# Patient Record
Sex: Female | Born: 1995 | Race: White | Hispanic: No | Marital: Single | Smoking: Former smoker
Health system: Southern US, Community
[De-identification: ages and names within clinical notes are randomized; demographics above are authoritative.]

## PROBLEM LIST (undated history)

## (undated) DIAGNOSIS — N83209 Unspecified ovarian cyst, unspecified side: Secondary | ICD-10-CM

## (undated) DIAGNOSIS — E282 Polycystic ovarian syndrome: Secondary | ICD-10-CM

## (undated) DIAGNOSIS — F419 Anxiety disorder, unspecified: Secondary | ICD-10-CM

## (undated) HISTORY — PX: WISDOM TOOTH EXTRACTION: SHX21

## (undated) HISTORY — DX: Unspecified ovarian cyst, unspecified side: N83.209

## (undated) HISTORY — DX: Anxiety disorder, unspecified: F41.9

---

## 2018-07-09 DIAGNOSIS — F419 Anxiety disorder, unspecified: Secondary | ICD-10-CM

## 2018-07-09 DIAGNOSIS — F32A Depression, unspecified: Secondary | ICD-10-CM | POA: Insufficient documentation

## 2018-07-09 HISTORY — DX: Anxiety disorder, unspecified: F41.9

## 2018-10-07 ENCOUNTER — Emergency Department: Payer: Managed Care, Other (non HMO)

## 2018-10-07 ENCOUNTER — Emergency Department
Admission: EM | Admit: 2018-10-07 | Discharge: 2018-10-07 | Disposition: A | Discharge status: Home / Self Care | Payer: Managed Care, Other (non HMO) | Attending: Student in an Organized Health Care Education/Training Program | Admitting: Student in an Organized Health Care Education/Training Program

## 2018-10-07 ENCOUNTER — Other Ambulatory Visit: Payer: Self-pay

## 2018-10-07 ENCOUNTER — Ambulatory Visit: Payer: Self-pay | Admitting: Adult Health

## 2018-10-07 ENCOUNTER — Encounter: Payer: Self-pay | Admitting: Emergency Medicine

## 2018-10-07 VITALS — BP 118/84 | HR 87 | Temp 98.1°F | Resp 14

## 2018-10-07 DIAGNOSIS — F419 Anxiety disorder, unspecified: Secondary | ICD-10-CM

## 2018-10-07 DIAGNOSIS — R0602 Shortness of breath: Secondary | ICD-10-CM | POA: Diagnosis not present

## 2018-10-07 DIAGNOSIS — R0789 Other chest pain: Secondary | ICD-10-CM | POA: Insufficient documentation

## 2018-10-07 DIAGNOSIS — R079 Chest pain, unspecified: Secondary | ICD-10-CM | POA: Diagnosis not present

## 2018-10-07 DIAGNOSIS — Z79899 Other long term (current) drug therapy: Secondary | ICD-10-CM | POA: Diagnosis not present

## 2018-10-07 HISTORY — DX: Polycystic ovarian syndrome: E28.2

## 2018-10-07 LAB — BASIC METABOLIC PANEL
Anion gap: 9 (ref 5–15)
BUN: 12 mg/dL (ref 6–20)
CO2: 21 mmol/L — ABNORMAL LOW (ref 22–32)
Calcium: 9.4 mg/dL (ref 8.9–10.3)
Chloride: 104 mmol/L (ref 98–111)
Creatinine, Ser: 0.59 mg/dL (ref 0.44–1.00)
GFR calc Af Amer: >60 mL/min (ref >60)
Glucose, Bld: 82 mg/dL (ref 70–99)
Potassium: 3.8 mmol/L (ref 3.5–5.1)
Sodium: 134 mmol/L — ABNORMAL LOW (ref 135–145)

## 2018-10-07 LAB — CBC
HCT: 38.9 % (ref 36.0–46.0)
Hemoglobin: 12.9 g/dL (ref 12.0–15.0)
MCH: 28.4 pg (ref 26.0–34.0)
MCHC: 33.2 g/dL (ref 30.0–36.0)
MCV: 85.5 fL (ref 80.0–100.0)
Platelets: 329 10*3/uL (ref 150–400)
RBC: 4.55 MIL/uL (ref 3.87–5.11)
RDW: 13.1 % (ref 11.5–15.5)
WBC: 14.1 10*3/uL — AB (ref 4.0–10.5)
nRBC: 0 % (ref 0.0–0.2)

## 2018-10-07 LAB — TROPONIN I: Troponin I: <0.03 ng/mL (ref <0.03)

## 2018-10-07 LAB — FIBRIN DERIVATIVES D-DIMER (ARMC ONLY): FIBRIN DERIVATIVES D-DIMER (ARMC): 192.5 ng{FEU}/mL (ref 0.00–499.00)

## 2018-10-07 MED ORDER — IPRATROPIUM-ALBUTEROL 0.5-2.5 (3) MG/3ML IN SOLN
3.0000 mL | Freq: Once | RESPIRATORY_TRACT | Status: AC
  Administered 2018-10-07: 3 mL via RESPIRATORY_TRACT
  Filled 2018-10-07: qty 3

## 2018-10-07 MED ORDER — PREDNISONE 20 MG PO TABS: 40.0000 mg | ORAL_TABLET | Freq: Every day | ORAL | 0 refills | Status: AC

## 2018-10-07 MED ORDER — ALBUTEROL SULFATE HFA 108 (90 BASE) MCG/ACT IN AERS: 2.0000 | INHALATION_SPRAY | Freq: Four times a day (QID) | RESPIRATORY_TRACT | 2 refills | Status: DC | PRN

## 2018-10-07 MED ORDER — HYDROCODONE-ACETAMINOPHEN 5-325 MG PO TABS: 1.0000 | ORAL_TABLET | ORAL | 0 refills | Status: DC | PRN

## 2018-10-07 MED ORDER — SODIUM CHLORIDE 0.9% FLUSH: 3.0000 mL | Freq: Once | INTRAVENOUS | Status: DC

## 2018-10-07 MED ORDER — NAPROXEN 500 MG PO TABS
500.0000 mg | ORAL_TABLET | Freq: Once | ORAL | Status: AC
  Administered 2018-10-07: 500 mg via ORAL
  Filled 2018-10-07: qty 1

## 2018-10-07 NOTE — ED Triage Notes (Signed)
PT c/o CP that started last night with SOB, pt states pain resolved throughout night but pain started again as she began her day. NAD noted. ambualtory

## 2018-10-07 NOTE — Patient Instructions (Signed)

## 2018-10-07 NOTE — ED Provider Notes (Signed)
[facility] Emergency Department Provider Note    First MD Initiated Contact with Patient 10/07/18 1724     (approximate)  I have reviewed the triage vital signs and the nursing notes.   HISTORY  Chief Complaint Chest Pain    HPI Sandra Marsh is a 23 y.o. female history of anxiety and PCOS presents to the ER for chief complaint of chest discomfort particularly with taking deep inspiration and shortness of breath.  States that symptoms happened last night when she was laying down to bed.  Woke up with similar symptoms.  She states she also having some reproducible pain with palpation of the left chest wall.  No recent fevers.  Denies any productive cough.  Denies any history of asthma or bronchitis.  Does have some secondhand smoke exposure.   She is on birth control.   Past Medical History:  Diagnosis Date  . Anxiety 07/09/2018  . PCOS (polycystic ovarian syndrome)    Family History  Problem Relation Age of Onset  . Hypertension Paternal Grandmother    History reviewed. No pertinent surgical history. Patient Active Problem List   Diagnosis Date Noted  . chest pain/ pressure  10/07/2018  . Shortness of breath at rest and with exertion  10/07/2018  . Anxiety 07/09/2018      Prior to Admission medications   Medication Sig Start Date End Date Taking? Authorizing Provider  albuterol (PROVENTIL HFA;VENTOLIN HFA) 108 (90 Base) MCG/ACT inhaler Inhale 2 puffs into the lungs every 6 (six) hours as needed for wheezing or shortness of breath. 10/07/18   Merlyn Lot, MD  citalopram (CELEXA) 20 MG tablet Take 20 mg by mouth daily. 09/22/18   [provider]  HYDROcodone-acetaminophen (NORCO) 5-325 MG tablet Take 1 tablet by mouth every 4 (four) hours as needed for moderate pain. 10/07/18   Merlyn Lot, MD  [facility] 7/7/7 0.5/0.75/1-35 MG-MCG tablet Take 1 tablet by mouth daily. 09/22/18   [provider]  predniSONE (DELTASONE) 20 MG  tablet Take 2 tablets (40 mg total) by mouth daily for 5 days. 10/07/18 10/12/18  Merlyn Lot, MD  traZODone (DESYREL) 100 MG tablet TAKE 1 TABLET BY MOUTH ONCE DAILY AFTER A MEAL 09/19/18   [provider]    Allergies Patient has no known allergies.    Social History Social History   Tobacco Use  . Smoking status: Never Smoker  . Smokeless tobacco: Never Used  Substance Use Topics  . Alcohol use: Never    Frequency: Never  . Drug use: Never    Review of Systems Patient denies headaches, rhinorrhea, blurry vision, numbness, shortness of breath, chest pain, edema, cough, abdominal pain, nausea, vomiting, diarrhea, dysuria, fevers, rashes or hallucinations unless otherwise stated above in HPI. ____________________________________________   PHYSICAL EXAM:  VITAL SIGNS: Vitals:   10/07/18 1555  BP: 121/79  Pulse: 91  Temp: 98.7 F (37.1 C)  SpO2: 95%    Constitutional: Alert and oriented.  Eyes: Conjunctivae are normal.  Head: Atraumatic. Nose: No congestion/rhinnorhea. Mouth/Throat: Mucous membranes are moist.   Neck: No stridor. Painless ROM.  Cardiovascular: Normal rate, regular rhythm. Grossly normal heart sounds.  Good peripheral circulation. Respiratory: Normal respiratory effort.  No retractions. Lungs CTAB. Gastrointestinal: Soft and nontender. No distention. No abdominal bruits. No CVA tenderness. Genitourinary:  Musculoskeletal: No lower extremity tenderness nor edema.  No joint effusions. Neurologic:  Normal speech and language. No gross focal neurologic deficits are appreciated. No facial droop Skin:  Skin is warm, dry  and intact. No rash noted. Psychiatric: Mood and affect are normal. Speech and behavior are normal.  ____________________________________________   LABS (all labs ordered are listed, but only abnormal results are displayed)  Results for orders placed or performed during the hospital encounter of 10/07/18 (from the past 24  hour(s))  Basic metabolic panel     Status: Abnormal   Collection Time: 10/07/18  4:04 PM  Result Value Ref Range   Sodium 134 (L) 135 - 145 mmol/L   Potassium 3.8 3.5 - 5.1 mmol/L   Chloride 104 98 - 111 mmol/L   CO2 21 (L) 22 - 32 mmol/L   Glucose, Bld 82 70 - 99 mg/dL   BUN 12 6 - 20 mg/dL   Creatinine, Ser 0.59 0.44 - 1.00 mg/dL   Calcium 9.4 8.9 - 10.3 mg/dL   GFR calc non Af Amer >60 >60 mL/min   GFR calc Af Amer >60 >60 mL/min   Anion gap 9 5 - 15  CBC     Status: Abnormal   Collection Time: 10/07/18  4:04 PM  Result Value Ref Range   WBC 14.1 (H) 4.0 - 10.5 K/uL   RBC 4.55 3.87 - 5.11 MIL/uL   Hemoglobin 12.9 12.0 - 15.0 g/dL   HCT 38.9 36.0 - 46.0 %   MCV 85.5 80.0 - 100.0 fL   MCH 28.4 26.0 - 34.0 pg   MCHC 33.2 30.0 - 36.0 g/dL   RDW 13.1 11.5 - 15.5 %   Platelets 329 150 - 400 K/uL   nRBC 0.0 0.0 - 0.2 %  Troponin I - ONCE - STAT     Status: None   Collection Time: 10/07/18  4:04 PM  Result Value Ref Range   Troponin I <0.03 <0.03 ng/mL  Fibrin derivatives D-Dimer ([facility] only)     Status: None   Collection Time: 10/07/18  6:22 PM  Result Value Ref Range   Fibrin derivatives D-dimer ([facility]) 192.50 0.00 - 499.00 ng/mL (FEU)   ____________________________________________  EKG My review and personal interpretation at Time: 16:00   Indication: chest pain  Rate: 90  Rhythm: sinus Axis: normal Other: normal intervals, no stemi ____________________________________________  RADIOLOGY  I personally reviewed all radiographic images ordered to evaluate for the above acute complaints and reviewed radiology reports and findings.  These findings were personally discussed with the patient.  Please see medical record for radiology report.  ____________________________________________   PROCEDURES  Procedure(s) performed:  Procedures    Critical Care performed: no ____________________________________________   INITIAL IMPRESSION / ASSESSMENT AND PLAN / ED  COURSE  Pertinent labs & imaging results that were available during my care of the patient were reviewed by me and considered in my medical decision making (see chart for details).   DDX: ACS, pericarditis, esophagitis, boerhaaves, pe, dissection, pna, bronchitis, costochondritis   Emmogene Timmons is a 23 y.o. who presents to the ED with chest discomfort and shortness of breath as described above.  She is low risk by heart score.  This not consistent with ACS or congestive heart failure.  No evidence of pneumonia.  She is low risk by Wells criteria but is pregnant therefore will send d-dimer to further re-stratify for PE.  No fevers does have some secondhand smoke exposure.  Will give nebulizer to evaluate for any improvement as she may be having some bronchitic symptoms.  No signs or symptoms to suggest referred discomfort from the abdomen.  Clinical Course as of Oct 07 1937  Tue Oct 07, 2018  1936 Patient reassessed.  Did have some improvement after nebulizer and therefore will treat for bronchitis.  Her d-dimer is negative.  She is otherwise stable and appropriate for outpatient follow-up.   [PR]    Clinical Course User Index [PR] Merlyn Lot, MD     As part of my medical decision making, I reviewed the following data within the [place] notes reviewed and incorporated, Labs reviewed, notes from prior ED visits and [place] Controlled Substance Database   ____________________________________________   FINAL CLINICAL IMPRESSION(S) / ED DIAGNOSES  Final diagnoses:  Chest pain, unspecified type      NEW MEDICATIONS STARTED DURING THIS VISIT:  New Prescriptions   ALBUTEROL (PROVENTIL HFA;VENTOLIN HFA) 108 (90 BASE) MCG/ACT INHALER    Inhale 2 puffs into the lungs every 6 (six) hours as needed for wheezing or shortness of breath.   HYDROCODONE-ACETAMINOPHEN (NORCO) 5-325 MG TABLET    Take 1 tablet by mouth every 4 (four) hours as needed for moderate pain.    PREDNISONE (DELTASONE) 20 MG TABLET    Take 2 tablets (40 mg total) by mouth daily for 5 days.     Note:  This document was prepared using Dragon voice recognition software and may include unintentional dictation errors.    Merlyn Lot, MD 10/07/18 [phone]

## 2018-10-07 NOTE — Progress Notes (Signed)
[facility]  Subjective:     Patient ID: Sandra Marsh, female   DOB: 01/13/1996, 23 y.o.   MRN: [id number]  HPI  Blood pressure 118/84, pulse 87, temperature 98.1 F (36.7 C), temperature source Oral, resp. rate 14, last menstrual period 09/18/2018, SpO2 98 %.  Patient is a  23 year old female in no acute distress who comes to the clinic for complaint of chest pressure that she reports started last night around 6 pm, she reports she had 9/10 substernal pain that radiated to her clavicle on left and right side. She denies any other radiation of pain.  Denies any nausea, vomiting or acid reflux history.   She had 9/10 pain at that time lasting over 4 hours, pain is now a 5/10 the entire day today. She denies positioning changes affecting pain. She describes the pain as a dull ache. Pressure/ pain still remains. She reports she had severe shortness of breath with the intermittent pain/pressure that developed with the chest pressure/ pain.  Patient states " it was so bad I almost went to the emergency room"   She denies any heavy lifting, injury, or muscular pain.  Denies any worsening pain with movement or stretching. She reports a very mild cough today, denies any congestion. Denies any cough or recent illness or hospitalization.  No congestion.  She denies any worsening of symptoms before or after meals.  She does have a history of anxiety though she state " I have never had this feeling before". Denies any palpitations. She denies any increased stressors, she is on Celexa and taking it regular- she denies any increased or worsening anxiety. Denies homicidal or suicidal ideations. She denies a history of any panic attacks.   Yesterday she reports 8/10 chest pressure.  Denies nay cardiac history.  She reports she had shortness of braeth at rest and at exertion.   She reports many in her office have been sick, one with pneumonia she reports.   She  denies any cardiac or gastrointestinal history in herself or her family.   Patient  denies any fever, body aches,chills, rash,  nausea, vomiting, or diarrhea.  Bowel movements reported as normal, without any blood.  Denies any hoarseness or change in phonation.  Denies any pain with inspiration or expiration.     Current Outpatient Medications:  .  citalopram (CELEXA) 20 MG tablet, Take 20 mg by mouth daily., Disp: , Rfl:  .  [place] 7/7/7 0.5/0.75/1-35 MG-MCG tablet, Take 1 tablet by mouth daily., Disp: , Rfl:  .  traZODone (DESYREL) 100 MG tablet, TAKE 1 TABLET BY MOUTH ONCE DAILY AFTER A MEAL, Disp: , Rfl:  Review of Systems  Constitutional: Negative for activity change, appetite change, chills, diaphoresis, fatigue, fever and unexpected weight change.  HENT: Negative for congestion, dental problem, drooling, ear discharge, ear pain, facial swelling, hearing loss, mouth sores, nosebleeds, postnasal drip, rhinorrhea, sinus pressure, sinus pain, sneezing, sore throat, tinnitus, trouble swallowing and voice change.   Eyes: Negative.   Respiratory: Positive for chest tightness and shortness of breath (last night denies currently ). Negative for apnea, cough, choking, wheezing and stridor.   Cardiovascular: Positive for chest pain (pressure today / pain last night ). Negative for palpitations and leg swelling.  Gastrointestinal: Negative.   Genitourinary: Negative.   Musculoskeletal: Negative.   Skin: Negative.   Neurological: Negative.   Hematological: Negative.   Psychiatric/Behavioral: Negative.        Objective:   Physical  Exam Constitutional:      General: She is not in acute distress.    Appearance: Normal appearance. She is well-groomed and overweight.  HENT:     Head: Normocephalic and atraumatic.     Right Ear: Tympanic membrane, ear canal and external ear normal. There is no impacted cerumen.     Left Ear: Tympanic membrane, ear canal and external ear normal. There is no  impacted cerumen.     Nose: Nose normal. No congestion or rhinorrhea.     Mouth/Throat:     Lips: Pink.     Mouth: Mucous membranes are moist.     Pharynx: No oropharyngeal exudate or posterior oropharyngeal erythema.  Eyes:     General: No scleral icterus.       Right eye: No discharge.        Left eye: No discharge.     Conjunctiva/sclera: Conjunctivae normal.  Neck:     Musculoskeletal: Full passive range of motion without pain, normal range of motion and neck supple. No neck rigidity or muscular tenderness.     Vascular: No carotid bruit.     Trachea: Trachea normal.  Cardiovascular:     Rate and Rhythm: Normal rate and regular rhythm.     Chest Wall: PMI is not displaced. No thrill.     Pulses:          Radial pulses are 2+ on the right side and 2+ on the left side.       Posterior tibial pulses are 2+ on the right side and 2+ on the left side.     Heart sounds: Normal heart sounds. No murmur. No friction rub. No gallop.   Pulmonary:     Effort: Pulmonary effort is normal. No accessory muscle usage, prolonged expiration, respiratory distress or retractions.     Breath sounds: Normal breath sounds and air entry. No stridor. No wheezing, rhonchi or rales.     Comments: Unable to reproduce any muscular skeletal pain   Chest:     Chest wall: No tenderness.  Abdominal:     Palpations: Abdomen is soft.  Musculoskeletal: Normal range of motion.        General: No swelling, tenderness, deformity or signs of injury.     Right lower leg: No edema.     Left lower leg: No edema.  Lymphadenopathy:     Cervical: No cervical adenopathy.     Right cervical: No superficial, deep or posterior cervical adenopathy.    Left cervical: No superficial, deep or posterior cervical adenopathy.  Skin:    General: Skin is warm and dry.  Neurological:     General: No focal deficit present.     Mental Status: She is alert and oriented to person, place, and time.  Psychiatric:        Behavior:  Behavior is cooperative.        Assessment:     chest pain/ pressure   Shortness of breath at rest and with exertion       Plan:     Provider discussed need for immediate evaluation in the emergency room to rule out any cardiac underlying etiology given severity of symptoms last night and this morning and lack of any respiratory congestion or symptoms.(Stat labs and imaging are not available in this clinic)  Other differentials include gastro-intestinal reflux,anxiety-  however cardiac origin must be ruled out prior Emergency room visit now is advised, patient not to self drive. Patient is offered that provider will  accompany to the emergency room in volunteer car, she declined this service or 911.  Patient prefers to self drive, risk and benefits not excluding death discussed with patient.  Patient verbalized understanding and signed AMA form. She reports she will go back to work and then go to the emergency room after talking to her boss.  Patient was advised that this is not recommended and risk and benefits were explained.  Questions were answered and patient verbalized understanding of all instructions.  Provider also recommends patient see primary care physician for a routine physical and to establish primary care. Patient may chose provider of choice. Also gave the [place] at [phone] or web site at [email or web address] to help assist with finding a primary care doctor. Patient understands this office is acute care office only and no primary care is done at this office.    Patient verbalized understanding of all instructions given and denies any further questions at this time.

## 2018-10-29 ENCOUNTER — Ambulatory Visit: Payer: Medicaid Other | Admitting: Adult Health

## 2018-10-29 VITALS — BP 120/82 | HR 88 | Temp 98.6°F | Resp 16

## 2018-10-29 DIAGNOSIS — J111 Influenza due to unidentified influenza virus with other respiratory manifestations: Secondary | ICD-10-CM

## 2018-10-29 DIAGNOSIS — Z20828 Contact with and (suspected) exposure to other viral communicable diseases: Secondary | ICD-10-CM

## 2018-10-29 MED ORDER — OSELTAMIVIR PHOSPHATE 75 MG PO CAPS: 75.0000 mg | ORAL_CAPSULE | Freq: Two times a day (BID) | ORAL | 0 refills | Status: DC

## 2018-10-29 NOTE — Progress Notes (Signed)
[facility]  Subjective:     Patient ID: Sandra Marsh, female   DOB: Dec 27, 1995, 23 y.o.   MRN: [id number]  HPI   Blood pressure 120/82, pulse 88, temperature 98.6 F (37 C), resp. rate 16, last menstrual period 10/24/2018, SpO2 100 %. No Known Allergies   Patient is a 23 year old female in no acute distress who comes to the clinic for chief complaint of body aches, mild chills, didn't take temperature yesterday felt feverish with chills. Symptoms last 2 days. Nasal congestion, chest congestion. Yellow chest congestion. Runny nose x 2 days.   She reports flu exposure last week by a family member.  Patient denies any chance of pregnancy. Patient's last menstrual period was 10/24/2018 (exact date).  Patient  denies any rash, chest pain, shortness of breath, nausea, vomiting, or diarrhea.    Seen in Emergency room on 10/07/2017 treated for Bronchitis. Prednisone and  Amoxicillin she reports all of her symptoms resolved.  Clinical Course as of Oct 07 1937  Tue Oct 07, 2018  1936 Patient reassessed.  Did have some improvement after nebulizer and therefore will treat for bronchitis.  Her d-dimer is negative.  She is otherwise stable and appropriate for outpatient follow-up.   [PR]  Patient was advised to follow up with [place] Results (from the past 2160 hour(s))  Basic metabolic panel     Status: Abnormal   Collection Time: 10/07/18  4:04 PM  Result Value Ref Range   Sodium 134 (L) 135 - 145 mmol/L   Potassium 3.8 3.5 - 5.1 mmol/L   Chloride 104 98 - 111 mmol/L   CO2 21 (L) 22 - 32 mmol/L   Glucose, Bld 82 70 - 99 mg/dL   BUN 12 6 - 20 mg/dL   Creatinine, Ser 0.59 0.44 - 1.00 mg/dL   Calcium 9.4 8.9 - 10.3 mg/dL   GFR calc non Af Amer >60 >60 mL/min   GFR calc Af Amer >60 >60 mL/min   Anion gap 9 5 - 15    Comment: Performed at [facility], [address] [place], [place] [address]  CBC     Status: Abnormal    Collection Time: 10/07/18  4:04 PM  Result Value Ref Range   WBC 14.1 (H) 4.0 - 10.5 K/uL   RBC 4.55 3.87 - 5.11 MIL/uL   Hemoglobin 12.9 12.0 - 15.0 g/dL   HCT 38.9 36.0 - 46.0 %   MCV 85.5 80.0 - 100.0 fL   MCH 28.4 26.0 - 34.0 pg   MCHC 33.2 30.0 - 36.0 g/dL   RDW 13.1 11.5 - 15.5 %   Platelets 329 150 - 400 K/uL   nRBC 0.0 0.0 - 0.2 %    Comment: Performed at [facility], [address] [place], [place] [address]  Troponin I - ONCE - STAT     Status: None   Collection Time: 10/07/18  4:04 PM  Result Value Ref Range   Troponin I <0.03 <0.03 ng/mL    Comment: Performed at [facility], [address] [place], [place] [address]  Fibrin derivatives D-Dimer [facility] only)     Status: None   Collection Time: 10/07/18  6:22 PM  Result Value Ref Range   Fibrin derivatives D-dimer ([facility]) 192.50 0.00 - 499.00 ng/mL (FEU)    Comment: (NOTE) <> Exclusion of Venous Thromboembolism (VTE) - OUTPATIENT ONLY   (Emergency Department or [place])   0-499 ng/ml (FEU):  With a low to intermediate pretest probability                      for VTE this test result excludes the diagnosis                      of VTE.   >499 ng/ml (FEU) : VTE not excluded; additional work up for VTE is                      required. <> Testing on Inpatients and Evaluation of Disseminated Intravascular   Coagulation (DIC) Reference Range:   0-499 ng/ml (FEU) Performed at [facility], [address] [place], [place] [address]      Review of Systems  Constitutional: Positive for chills, fatigue and fever. Negative for activity change, appetite change, diaphoresis and unexpected weight change.  HENT: Positive for congestion, postnasal drip and rhinorrhea. Negative for dental problem, drooling, ear discharge, ear pain, facial swelling, hearing loss, mouth sores, nosebleeds, sinus pressure, sinus pain, sneezing, sore throat, tinnitus, trouble swallowing and voice change.   Eyes: Negative.    Respiratory: Positive for cough. Negative for apnea, choking, chest tightness, shortness of breath, wheezing and stridor.   Cardiovascular: Negative.  Negative for chest pain, palpitations and leg swelling.  Gastrointestinal: Negative.   Endocrine: Negative.   Genitourinary: Negative.   Musculoskeletal: Negative.   Skin: Negative.   Neurological: Negative.   Hematological: Negative.   Psychiatric/Behavioral: Negative.        Objective:   Physical Exam Vitals signs reviewed.  Constitutional:      General: She is not in acute distress.    Appearance: She is well-developed. She is obese. She is not ill-appearing, toxic-appearing or diaphoretic.     Comments: Patient is alert and oriented and responsive to questions Engages in eye contact with provider. Speaks in full sentences without any pauses without any shortness of breath or distress.    HENT:     Head: Normocephalic and atraumatic.     Mouth/Throat:     Mouth: Mucous membranes are moist.     Pharynx: Oropharynx is clear.  Eyes:     General: No scleral icterus.    Extraocular Movements: Extraocular movements intact.     Right eye: Normal extraocular motion.     Left eye: Normal extraocular motion.     Pupils: Pupils are equal, round, and reactive to light. Pupils are equal.  Neck:     Musculoskeletal: Normal range of motion and neck supple. No neck rigidity.     Meningeal: Brudzinski's sign and Kernig's sign absent.  Cardiovascular:     Rate and Rhythm: Normal rate and regular rhythm.     Heart sounds: Normal heart sounds.  Pulmonary:     Effort: Pulmonary effort is normal.     Breath sounds: Normal breath sounds.  Abdominal:     Palpations: Abdomen is soft.  Musculoskeletal: Normal range of motion.  Lymphadenopathy:     Cervical: No cervical adenopathy.  Skin:    General: Skin is warm and dry.     Capillary Refill: Capillary refill takes less than 2 seconds.  Neurological:     Mental Status: She is alert.      GCS: GCS eye subscore is 4. GCS verbal subscore is 5. GCS motor subscore is 6.     Comments: Patient moves on and off of exam table and in room without difficulty. Gait is normal  in hall and in room. Patient is oriented to person place time and situation. Patient answers questions appropriately and engages in conversation.    Psychiatric:        Mood and Affect: Mood normal. Mood is not anxious or depressed.        Speech: Speech normal.        Behavior: Behavior normal. Behavior is not agitated.        Cognition and Memory: Cognition is not impaired. Memory is not impaired.        Assessment:     Influenza  Exposure to the flu      Plan:     Sean was seen today for nasal congestion, headache and generalized body aches.  Diagnoses and all orders for this visit:  Influenza  Exposure to the flu  Other orders -     oseltamivir (TAMIFLU) 75 MG capsule; Take 1 capsule (75 mg total) by mouth 2 (two) times daily.  Provider also discussed with patient that she needs to find a primary care physician for routine labs and female health maintenance.  Verbalized understanding and was given resource to help find a primary care provider. Follow up with primary care as needed for chronic and maintenance health care- can be seen in this employee clinic for acute care.   Gave and reviewed After Visit Summary(AVS) with patient. Patient is advised to read the after visit summary as well and let the provider know if any question, concerns or clarifications are needed.   Advised patient call the office or your primary care doctor for an appointment if no improvement within 72 hours or if any symptoms change or worsen at any time  Advised ER or urgent Care if after hours or on weekend. Call 911 for emergency symptoms at any time.Patinet verbalized understanding of all instructions given/reviewed and treatment plan and has no further questions or concerns at this time.    Patient verbalized  understanding of all instructions given and denies any further questions at this time.

## 2018-10-29 NOTE — Patient Instructions (Addendum)
Provider also recommends patient see primary care physician for a routine physical and to establish primary care. Patient may chose provider of choice. Also gave the [facility] at [phone] or web site at [email or web address] to help assist with finding a primary care doctor. Patient understands this office is acute care office only and no primary care is done at this office.   To Find a Primary Care or Specialty Doctor: Call [phone] or [phone] to access [facility] service. Or [email or web address]  Please see a primary care provider for routine health maintenance and regular labs within 1-2 weeks.     Influenza, Adult Influenza is also called "the flu." It is an infection in the lungs, nose, and throat (respiratory tract). It is caused by a virus. The flu causes symptoms that are similar to symptoms of a cold. It also causes a high fever and body aches. The flu spreads easily from person to person (is contagious). Getting a flu shot (influenza vaccination) every year is the best way to prevent the flu. What are the causes? This condition is caused by the influenza virus. You can get the virus by:  Breathing in droplets that are in the air from the cough or sneeze of a person who has the virus.  Touching something that has the virus on it (is contaminated) and then touching your mouth, nose, or eyes. What increases the risk? Certain things may make you more likely to get the flu. These include:  Not washing your hands often.  Having close contact with many people during cold and flu season.  Touching your mouth, eyes, or nose without first washing your hands.  Not getting a flu shot every year. You may have a higher risk for the flu, along with serious problems such as a lung infection (pneumonia), if you:  Are older than 65.  Are pregnant.  Have a weakened disease-fighting system (immune system) because of a disease  or taking certain medicines.  Have a long-term (chronic) illness, such as: ? Heart, kidney, or lung disease. ? Diabetes. ? Asthma.  Have a liver disorder.  Are very overweight (morbidly obese).  Have anemia. This is a condition that affects your red blood cells. What are the signs or symptoms? Symptoms usually begin suddenly and last 4-14 days. They may include:  Fever and chills.  Headaches, body aches, or muscle aches.  Sore throat.  Cough.  Runny or stuffy (congested) nose.  Chest discomfort.  Not wanting to eat as much as normal (poor appetite).  Weakness or feeling tired (fatigue).  Dizziness.  Feeling sick to your stomach (nauseous) or throwing up (vomiting). How is this treated? If the flu is found early, you can be treated with medicine that can help reduce how bad the illness is and how long it lasts (antiviral medicine). This may be given by mouth (orally) or through an IV tube. Taking care of yourself at home can help your symptoms get better. Your doctor may suggest:  Taking over-the-counter medicines.  Drinking plenty of fluids. The flu often goes away on its own. If you have very bad symptoms or other problems, you may be treated in a hospital. Follow these instructions at home:     Activity  Rest as needed. Get plenty of sleep.  Stay home from work or school as told by your doctor. ? Do not leave home until you do not have a fever for 24 hours without taking medicine. ?  Leave home only to visit your doctor. Eating and drinking  Take an ORS (oral rehydration solution). This is a drink that is sold at pharmacies and stores.  Drink enough fluid to keep your pee (urine) pale yellow.  Drink clear fluids in small amounts as you are able. Clear fluids include: ? Water. ? Ice chips. ? Fruit juice that has water added (diluted fruit juice). ? Low-calorie sports drinks.  Eat bland, easy-to-digest foods in small amounts as you are able. These foods  include: ? Bananas. ? Applesauce. ? Rice. ? Lean meats. ? Toast. ? Crackers.  Do not eat or drink: ? Fluids that have a lot of sugar or caffeine. ? Alcohol. ? Spicy or fatty foods. General instructions  Take over-the-counter and prescription medicines only as told by your doctor.  Use a cool mist humidifier to add moisture to the air in your home. This can make it easier for you to breathe.  Cover your mouth and nose when you cough or sneeze.  Wash your hands with soap and water often, especially after you cough or sneeze. If you cannot use soap and water, use alcohol-based hand sanitizer.  Keep all follow-up visits as told by your doctor. This is important. How is this prevented?   Get a flu shot every year. You may get the flu shot in late summer, fall, or winter. Ask your doctor when you should get your flu shot.  Avoid contact with people who are sick during fall and winter (cold and flu season). Contact a doctor if:  You get new symptoms.  You have: ? Chest pain. ? Watery poop (diarrhea). ? A fever.  Your cough gets worse.  You start to have more mucus.  You feel sick to your stomach.  You throw up. Get help right away if you:  Have shortness of breath.  Have trouble breathing.  Have skin or nails that turn a bluish color.  Have very bad pain or stiffness in your neck.  Get a sudden headache.  Get sudden pain in your face or ear.  Cannot eat or drink without throwing up. Summary  Influenza ("the flu") is an infection in the lungs, nose, and throat. It is caused by a virus.  Take over-the-counter and prescription medicines only as told by your doctor.  Getting a flu shot every year is the best way to avoid getting the flu. This information is not intended to replace advice given to you by your health care provider. Make sure you discuss any questions you have with your health care provider. Document Released: 05/29/2008 Document Revised:  02/05/2018 Document Reviewed: 02/05/2018 Elsevier Interactive Patient Education  2019 Elsevier Inc.  Oseltamivir capsules What is this medicine? OSELTAMIVIR (os el TAM i vir) is an antiviral medicine. It is used to prevent and to treat some kinds of influenza or the flu. It will not work for colds or other viral infections. This medicine may be used for other purposes; ask your health care provider or pharmacist if you have questions. COMMON BRAND NAME(S): Tamiflu What should I tell my health care provider before I take this medicine? They need to know if you have any of the following conditions: -heart disease -immune system problems -kidney disease -liver disease -lung disease -an unusual or allergic reaction to oseltamivir, other medicines, foods, dyes, or preservatives -pregnant or trying to get pregnant -breast-feeding How should I use this medicine? Take this medicine by mouth with a glass of water. Follow the directions on  the prescription label. Start this medicine at the first sign of flu symptoms. You can take it with or without food. If it upsets your stomach, take it with food. Take your medicine at regular intervals. Do not take your medicine more often than directed. Take all of your medicine as directed even if you think you are better. Do not skip doses or stop your medicine early. Talk to your pediatrician regarding the use of this medicine in children. While this drug may be prescribed for children as young as 14 days for selected conditions, precautions do apply. Overdosage: If you think you have taken too much of this medicine contact a poison control center or emergency room at once. NOTE: This medicine is only for you. Do not share this medicine with others. What if I miss a dose? If you miss a dose, take it as soon as you remember. If it is almost time for your next dose (within 2 hours), take only that dose. Do not take double or extra doses. What may interact with  this medicine? - intranasal influenza vaccine This list may not describe all possible interactions. Give your health care provider a list of all the medicines, herbs, non-prescription drugs, or dietary supplements you use. Also tell them if you smoke, drink alcohol, or use illegal drugs. Some items may interact with your medicine. What should I watch for while using this medicine? Visit your doctor or health care professional for regular check ups. Tell your doctor if your symptoms do not start to get better or if they get worse. If you have the flu, you may be at an increased risk of developing seizures, confusion, or abnormal behavior. This occurs early in the illness, and more frequently in children and teens. These events are not common, but may result in accidental injury to the patient. Families and caregivers of patients should watch for signs of unusual behavior and contact a doctor or health care professional right away if the patient shows signs of unusual behavior. This medicine is not a substitute for the flu shot. Talk to your doctor each year about an annual flu shot. What side effects may I notice from receiving this medicine? Side effects that you should report to your doctor or health care professional as soon as possible: -allergic reactions like skin rash, itching or hives, swelling of the face, lips, or tongue -anxiety, confusion, unusual behavior -breathing problems -hallucination, loss of contact with reality -redness, blistering, peeling or loosening of the skin, including inside the mouth -seizures Side effects that usually do not require medical attention (report to your doctor or health care professional if they continue or are bothersome): -diarrhea -headache -nausea, vomiting -pain This list may not describe all possible side effects. Call your doctor for medical advice about side effects. You may report side effects to FDA at 1-800-FDA-1088. Where should I keep my  medicine? Keep out of the reach of children. Store at room temperature between 15 and 30 degrees C (59 and 86 degrees F). Throw away any unused medicine after the expiration date. NOTE: This sheet is a summary. It may not cover all possible information. If you have questions about this medicine, talk to your doctor, pharmacist, or health care provider.  2019 Elsevier/Gold Standard (2017-01-30 16:45:14)

## 2018-11-14 ENCOUNTER — Ambulatory Visit: Payer: Self-pay | Admitting: Adult Health

## 2019-03-03 ENCOUNTER — Emergency Department: Payer: Managed Care, Other (non HMO)

## 2019-03-03 ENCOUNTER — Other Ambulatory Visit: Payer: Self-pay

## 2019-03-03 ENCOUNTER — Telehealth: Payer: Managed Care, Other (non HMO) | Admitting: Adult Health

## 2019-03-03 ENCOUNTER — Encounter: Payer: Self-pay | Admitting: Emergency Medicine

## 2019-03-03 ENCOUNTER — Encounter: Payer: Self-pay | Admitting: Adult Health

## 2019-03-03 ENCOUNTER — Emergency Department
Admission: EM | Admit: 2019-03-03 | Discharge: 2019-03-03 | Disposition: A | Discharge status: Home / Self Care | Payer: Managed Care, Other (non HMO) | Attending: Emergency Medicine | Admitting: Emergency Medicine

## 2019-03-03 DIAGNOSIS — R05 Cough: Secondary | ICD-10-CM | POA: Diagnosis present

## 2019-03-03 DIAGNOSIS — M79662 Pain in left lower leg: Secondary | ICD-10-CM

## 2019-03-03 DIAGNOSIS — Z79899 Other long term (current) drug therapy: Secondary | ICD-10-CM | POA: Insufficient documentation

## 2019-03-03 DIAGNOSIS — R0602 Shortness of breath: Secondary | ICD-10-CM | POA: Diagnosis not present

## 2019-03-03 DIAGNOSIS — J069 Acute upper respiratory infection, unspecified: Secondary | ICD-10-CM | POA: Diagnosis not present

## 2019-03-03 DIAGNOSIS — Z20828 Contact with and (suspected) exposure to other viral communicable diseases: Secondary | ICD-10-CM | POA: Diagnosis not present

## 2019-03-03 LAB — CBC
HCT: 39.4 % (ref 36.0–46.0)
Hemoglobin: 12.6 g/dL (ref 12.0–15.0)
MCH: 29 pg (ref 26.0–34.0)
MCHC: 32 g/dL (ref 30.0–36.0)
MCV: 90.6 fL (ref 80.0–100.0)
Platelets: 263 10*3/uL (ref 150–400)
RBC: 4.35 MIL/uL (ref 3.87–5.11)
RDW: 13.2 % (ref 11.5–15.5)
WBC: 11.3 10*3/uL — ABNORMAL HIGH (ref 4.0–10.5)
nRBC: 0 % (ref 0.0–0.2)

## 2019-03-03 LAB — TROPONIN I (HIGH SENSITIVITY): Troponin I (High Sensitivity): <2 ng/L (ref <18)

## 2019-03-03 LAB — BASIC METABOLIC PANEL
Anion gap: 9 (ref 5–15)
BUN: 15 mg/dL (ref 6–20)
CO2: 19 mmol/L — ABNORMAL LOW (ref 22–32)
Calcium: 8.6 mg/dL — ABNORMAL LOW (ref 8.9–10.3)
Chloride: 108 mmol/L (ref 98–111)
Creatinine, Ser: 0.46 mg/dL (ref 0.44–1.00)
GFR calc Af Amer: >60 mL/min (ref >60)
GFR calc non Af Amer: >60 mL/min (ref >60)
Glucose, Bld: 79 mg/dL (ref 70–99)
Potassium: 4.3 mmol/L (ref 3.5–5.1)
Sodium: 136 mmol/L (ref 135–145)

## 2019-03-03 LAB — FIBRIN DERIVATIVES D-DIMER (ARMC ONLY): Fibrin derivatives D-dimer (ARMC): 210.99 ng/mL (FEU) (ref 0.00–499.00)

## 2019-03-03 MED ORDER — BENZONATATE 100 MG PO CAPS: 100.0000 mg | ORAL_CAPSULE | Freq: Three times a day (TID) | ORAL | 0 refills | Status: DC | PRN

## 2019-03-03 MED ORDER — FLUTICASONE PROPIONATE 50 MCG/ACT NA SUSP: 2.0000 | Freq: Every day | NASAL | 0 refills | Status: DC

## 2019-03-03 NOTE — Progress Notes (Signed)
Virtual Visit via Telephone Note  I connected with Sandra Marsh on 03/03/19 at 10:00 AM EDT by telephone and verified that I am speaking with the correct person using two identifiers.  Location: Patient: in office  Provider: [facility], [place], [place]     I discussed the limitations, risks, security and privacy concerns of performing an evaluation and management service by telephone and the availability of in person appointments. I also discussed with the patient that there may be a patient responsible charge related to this service. The patient expressed understanding and agreed to proceed.   History of Present Illness: Patient is a 23 year old female in no acute distress, who calls the clinic for a virtual visit, however Internet reception is down and a telephone visit was proceeded with. Patient had been sent questionnaire for sinus congestion due to symptoms she reported when scheduling appointment, she did not report leg swelling until she answered the questions on the questionnaire.  Provider  immediately called patient to clarify if she was actually having leg swelling.  This was patient's response below on my chart.   " Hi! Yes, the leg swelling I experienced was on Sunday. It was just my left leg and the swelling began at the the of my foot and progressively got worse up my calve. Throughout that entire day I would get shooting pains all the way from my foot to my hip. It would be so painful that I would double over. The swelling was still slightly there Monday morning but it has since gone down and I haven't had any issues"  Reports that she has had congestion and mild cough since stays.  She has had increasing shortness of breath since Monday. This factors include hormonal birth control.  Observations/Objective: No vital signs are available for this visit, patient denies any fever since onset of symptoms.  Patient is alert and oriented and  responsive to questions Engages in conversation with provider. Speaks in full sentences without any pauses without any shortness of breath or distress.    Assessment and Plan: 1. Pain of left calf   2. Shortness of breath at rest and with exertion       Follow Up Instructions: Patient is advised to go to the emergency room immediately, call 911 for emergencies and not to self drive.  Patient verbalized understanding and will proceed to the nearest emergency room to rule out DVT associated shortness of breath. Patient should follow-up with her primary care provider after the emergency room visit for further instructions and work-up if needed.   I discussed the assessment and treatment plan with the patient. The patient was provided an opportunity to ask questions and all were answered. The patient agreed with the plan and demonstrated an understanding of the instructions.   The patient was advised to call back or seek an in-person evaluation if the symptoms worsen or if the condition fails to improve as anticipated.  I provided 10 minutes of non-face-to-face time during this encounter.   Marcille Buffy, FNP

## 2019-03-03 NOTE — ED Notes (Signed)
Lab called blue top was hemolyzed per Manuela Schwartz. MG will redraw and send.

## 2019-03-03 NOTE — ED Provider Notes (Signed)
[facility] Emergency Department Provider Note  ____________________________________________  Time seen: Approximately 1:15 PM  I have reviewed the triage vital signs and the nursing notes.   HISTORY  Chief Complaint Cough, Generalized Body Aches, Headache, Shortness of Breath, and Leg Swelling    HPI Sandra Marsh is a 23 y.o. female that presents to the emergency department for evaluation of headache, body aches, nasal congestion, cough, shortness of breath for about 1 week.  Patient also states that on Sunday she had left lower extremity swelling that resolved by Sunday night.  She had a telehealth visit with [facility] today and was recommended to come to the emergency department to rule out a blood clot.  She sits next to a coworker that also has cold symptoms that tested negative for COVID last week.  She states that coworker is feeling much worse this week so was recommended to be retested for COVID.  She is on hormonal birth control.  She does not smoke.  She traveled to [place] recently.  No recent surgeries.  No fevers, vomiting, diarrhea.   Past Medical History:  Diagnosis Date  . Anxiety 07/09/2018  . PCOS (polycystic ovarian syndrome)     Patient Active Problem List   Diagnosis Date Noted  . chest pain/ pressure  10/07/2018  . Shortness of breath at rest and with exertion  10/07/2018  . Anxiety 07/09/2018    History reviewed. No pertinent surgical history.  Prior to Admission medications   Medication Sig Start Date End Date Taking? Authorizing Provider  albuterol (PROVENTIL HFA;VENTOLIN HFA) 108 (90 Base) MCG/ACT inhaler Inhale 2 puffs into the lungs every 6 (six) hours as needed for wheezing or shortness of breath. 10/07/18  Yes Merlyn Lot, MD  citalopram (CELEXA) 20 MG tablet Take 20 mg by mouth daily. 09/22/18  Yes [provider]  citalopram (CELEXA) 20 MG tablet Take by mouth. 09/19/18  Yes [provider]   HYDROcodone-acetaminophen (NORCO) 5-325 MG tablet Take 1 tablet by mouth every 4 (four) hours as needed for moderate pain. 10/07/18  Yes Merlyn Lot, MD  norethindrone-ethinyl estradiol (CYCLAFEM) 0.5/0.75/1-35 MG-MCG tablet Take 1 tablet by mouth daily.   Yes [provider]  traZODone (DESYREL) 100 MG tablet TAKE 1 TABLET BY MOUTH ONCE DAILY AFTER A MEAL 09/19/18  Yes [provider]  benzonatate (TESSALON PERLES) 100 MG capsule Take 1 capsule (100 mg total) by mouth 3 (three) times daily as needed for cough. 03/03/19 03/02/20  Laban Emperor, PA-C  fluticasone (FLONASE) 50 MCG/ACT nasal spray Place 2 sprays into both nostrils daily. 03/03/19 03/02/20  Laban Emperor, PA-C  ibuprofen (ADVIL) 800 MG tablet Take 800 mg by mouth every 8 (eight) hours as needed. for pain 01/15/19   [provider]  levonorgestrel (PLAN B 1-STEP) 1.5 MG tablet  01/20/19   [provider]  norethindrone-ethinyl estradiol (NORTREL 0.5/35, 28,) 0.5-35 MG-MCG tablet Take by mouth.    [provider]  oseltamivir (TAMIFLU) 75 MG capsule Take 1 capsule (75 mg total) by mouth 2 (two) times daily. 10/29/18   Flinchum, Kelby Aline, FNP  [facility] 7/7/7 0.5/0.75/1-35 MG-MCG tablet Take 1 tablet by mouth daily. 09/22/18   [provider]    Allergies Nickel  Family History  Problem Relation Age of Onset  . Hypertension Paternal Grandmother     Social History Social History   Tobacco Use  . Smoking status: Never Smoker  . Smokeless tobacco: Never Used  Substance Use Topics  . Alcohol  use: Never    Frequency: Never  . Drug use: Never     Review of Systems  Constitutional: No fever/chills Eyes: No visual changes. No discharge. ENT: Positive for congestion and rhinorrhea. Cardiovascular: No chest pain. Respiratory: Positive for cough and SOB. Gastrointestinal: No abdominal pain.  No nausea, no vomiting.  No diarrhea.  No constipation. Musculoskeletal: Negative  for musculoskeletal pain. Skin: Negative for rash, abrasions, lacerations, ecchymosis. Neurological: Negative for headaches.   ____________________________________________   PHYSICAL EXAM:  VITAL SIGNS: ED Triage Vitals  Enc Vitals Group     BP 03/03/19 1031 (!) 144/85     Pulse Rate 03/03/19 1031 80     Resp 03/03/19 1031 16     Temp 03/03/19 1031 99.1 F (37.3 C)     Temp Source 03/03/19 1031 Oral     SpO2 03/03/19 1031 98 %     Weight 03/03/19 1031 185 lb (83.9 kg)     Height 03/03/19 1031 5\' 1"  (1.549 m)     Head Circumference --      Peak Flow --      Pain Score 03/03/19 1036 0     Pain Loc --      Pain Edu? --      Excl. in [place]? --      Constitutional: Alert and oriented. Well appearing and in no acute distress. Eyes: Conjunctivae are normal. PERRL. EOMI. No discharge. Head: Atraumatic. ENT: No frontal and maxillary sinus tenderness.      Ears: Tympanic membranes pearly gray with good landmarks. No discharge.      Nose: Mild congestion/rhinnorhea.      Mouth/Throat: Mucous membranes are moist. Oropharynx non-erythematous. Tonsils not enlarged. No exudates. Uvula midline. Neck: No stridor.   Hematological/Lymphatic/Immunilogical: No cervical lymphadenopathy. Cardiovascular: Normal rate, regular rhythm.  Good peripheral circulation. Respiratory: Normal respiratory effort without tachypnea or retractions. Lungs CTAB. Good air entry to the bases with no decreased or absent breath sounds. Gastrointestinal: Bowel sounds 4 quadrants. Soft and nontender to palpation. No guarding or rigidity. No palpable masses. No distention. Musculoskeletal: Full range of motion to all extremities. No gross deformities appreciated. Neurologic:  Normal speech and language. No gross focal neurologic deficits are appreciated.  Skin:  Skin is warm, dry and intact. No rash noted. Psychiatric: Mood and affect are normal. Speech and behavior are normal. Patient exhibits appropriate insight and  judgement.   ____________________________________________   LABS (all labs ordered are listed, but only abnormal results are displayed)  Labs Reviewed  CBC - Abnormal; Notable for the following components:      Result Value   WBC 11.3 (*)    All other components within normal limits  BASIC METABOLIC PANEL - Abnormal; Notable for the following components:   CO2 19 (*)    Calcium 8.6 (*)    All other components within normal limits  NOVEL CORONAVIRUS, NAA (HOSPITAL ORDER, SEND-OUT TO REF LAB)  TROPONIN I (HIGH SENSITIVITY)  FIBRIN DERIVATIVES D-DIMER ([facility] ONLY)  TROPONIN I (HIGH SENSITIVITY)   ____________________________________________  EKG   ____________________________________________  RADIOLOGY Robinette Haines, personally viewed and evaluated these images (plain radiographs) as part of my medical decision making, as well as reviewing the written report by the radiologist.  Dg Chest Portable 1 View  Result Date: 03/03/2019 CLINICAL DATA:  Cough, shortness of breath EXAM: PORTABLE CHEST 1 VIEW COMPARISON:  10/07/2010 FINDINGS: The heart size and mediastinal contours are within normal limits. Both lungs are clear. The visualized skeletal structures are  unremarkable. IMPRESSION: No active disease. Electronically Signed   By: Kathreen Devoid   On: 03/03/2019 12:06    ____________________________________________    PROCEDURES  Procedure(s) performed:    Procedures    Medications - No data to display   ____________________________________________   INITIAL IMPRESSION / ASSESSMENT AND PLAN / ED COURSE  Pertinent labs & imaging results that were available during my care of the patient were reviewed by me and considered in my medical decision making (see chart for details).  Review of the [place] CSRS was performed in accordance of the [place] prior to dispensing any controlled drugs.     Patient's diagnosis is consistent with viral URI with cough. Vital signs and  exam are reassuring.  Lab work largely unremarkable.  White blood cell count mildly elevated, consistent with viral URI.  Chest x-ray negative for acute cardiopulmonary processes.  COVID test is pending.  Patient appears well and is staying well hydrated. Patient feels comfortable going home. Patient will be discharged home with prescriptions for [facility] and Flonase. Patient is to follow up with primary care as needed or otherwise directed. Patient is given ED precautions to return to the ED for any worsening or new symptoms.   Mikahla Wisor was evaluated in Emergency Department on 03/03/2019 for the symptoms described in the history of present illness. She was evaluated in the context of the global COVID-19 pandemic, which necessitated consideration that the patient might be at risk for infection with the SARS-CoV-2 virus that causes COVID-19. Institutional protocols and algorithms that pertain to the evaluation of patients at risk for COVID-19 are in a state of rapid change based on information released by regulatory bodies including the CDC and federal and state organizations. These policies and algorithms were followed during the patient's care in the ED.  ____________________________________________  FINAL CLINICAL IMPRESSION(S) / ED DIAGNOSES  Final diagnoses:  Viral URI with cough      NEW MEDICATIONS STARTED DURING THIS VISIT:  ED Discharge Orders         Ordered    benzonatate (TESSALON PERLES) 100 MG capsule  3 times daily PRN     03/03/19 1357    fluticasone (FLONASE) 50 MCG/ACT nasal spray  Daily     03/03/19 1357              This chart was dictated using voice recognition software/Dragon. Despite best efforts to proofread, errors can occur which can change the meaning. Any change was purely unintentional.    Laban Emperor, PA-C 03/03/19 1517    Delman Kitten, MD 03/03/19 [phone]

## 2019-03-03 NOTE — ED Triage Notes (Signed)
Says she did telehealth visit this am for short of breath, body aches, headache and cough.  She says she had swelling and pain to left lower extremity on Sunday and it went away that day.  The telehealth dr wanted her checked for dvt.

## 2019-03-04 LAB — NOVEL CORONAVIRUS, NAA (HOSP ORDER, SEND-OUT TO REF LAB; TAT 18-24 HRS): SARS-CoV-2, NAA: NOT DETECTED

## 2019-04-22 ENCOUNTER — Telehealth: Payer: Self-pay | Admitting: *Deleted

## 2019-04-22 ENCOUNTER — Encounter (INDEPENDENT_AMBULATORY_CARE_PROVIDER_SITE_OTHER): Payer: Self-pay

## 2019-04-22 ENCOUNTER — Telehealth: Payer: Managed Care, Other (non HMO) | Admitting: Nurse Practitioner

## 2019-04-22 DIAGNOSIS — R05 Cough: Secondary | ICD-10-CM

## 2019-04-22 DIAGNOSIS — Z20822 Contact with and (suspected) exposure to covid-19: Secondary | ICD-10-CM

## 2019-04-22 DIAGNOSIS — R059 Cough, unspecified: Secondary | ICD-10-CM

## 2019-04-22 DIAGNOSIS — R0602 Shortness of breath: Secondary | ICD-10-CM

## 2019-04-22 MED ORDER — BENZONATATE 100 MG PO CAPS: 100.0000 mg | ORAL_CAPSULE | Freq: Three times a day (TID) | ORAL | 0 refills | Status: DC | PRN

## 2019-04-22 NOTE — Telephone Encounter (Signed)
Pt had an E visit this morning and was enrolled into the [facility] Symptom Monitoring questionnaire.   She went ahead and filled it out today even though she had the E visit this morning.  I called and touched base with her.   She did not have any questions and I let her know how the Symptom Monitor worked.   She verbalized understanding.  I instructed her to follow the instructions given to her during her E visit.

## 2019-04-22 NOTE — Progress Notes (Signed)
E-Visit for Corona Virus Screening   Your current symptoms could be consistent with the coronavirus.  Many health care providers can now test patients at their office but not all are.  [facility] has multiple testing sites. For information on our COVID testing locations and hours go to [email or web address]  Please quarantine yourself while awaiting your test results.  We are enrolling you in our MyChart Home Montioring for COVID19 . Daily you will receive a questionnaire within the MyChart website. Our COVID 19 response team willl be monitoriing your responses daily.  You can go to one of the  testing sites listed below, while they are opened (see hours). You do not need a doctors order to be tested for covid.You do need to self-isolate until your results return and if positive 14 days from when your symptoms started and until you are 3 days symptom free.   Testing Locations (Monday - Friday, 8 a.m. - 3:30 p.m.) . [place]: [facility], [address], [place], [place]  . [place]: [facility], [address], [place], [place] (entrance off [address])  . [place]: [address], [place], [place] (across from [facility])    COVID-19 is a respiratory illness with symptoms that are similar to the flu. Symptoms are typically mild to moderate, but there have been cases of severe illness and death due to the virus. The following symptoms may appear 2-14 days after exposure: . Fever . Cough . Shortness of breath or difficulty breathing . Chills . Repeated shaking with chills . Muscle pain . Headache . Sore throat . New loss of taste or smell . Fatigue . Congestion or runny nose . Nausea or vomiting . Diarrhea  It is vitally important that if you feel that you have an infection such as this virus or any other virus that you stay home and away from places where you may  spread it to others.  You should self-quarantine for 14 days if you have symptoms that could potentially be coronavirus or have been in close contact a with a person diagnosed with COVID-19 within the last 2 weeks. You should avoid contact with people age 65 and older.   You should wear a mask or cloth face covering over your nose and mouth if you must be around other people or animals, including pets (even at home). Try to stay at least 6 feet away from other people. This will protect the people around you.  You can use medication such as A prescription cough medication called Tessalon Perles 100 mg. You may take 1-2 capsules every 8 hours as needed for cough  You may also take acetaminophen (Tylenol) as needed for fever.   Reduce your risk of any infection by using the same precautions used for avoiding the common cold or flu:  . Wash your hands often with soap and warm water for at least 20 seconds.  If soap and water are not readily available, use an alcohol-based hand sanitizer with at least 60% alcohol.  . If coughing or sneezing, cover your mouth and nose by coughing or sneezing into the elbow areas of your shirt or coat, into a tissue or into your sleeve (not your hands). . Avoid shaking hands with others and consider head nods or verbal greetings only. . Avoid touching your eyes, nose, or mouth with unwashed hands.  . Avoid close contact with people who are sick. . Avoid places or   events with large numbers of people in one location, like concerts or sporting events. . Carefully consider travel plans you have or are making. . If you are planning any travel outside or inside the [place], visit the CDC's Travelers' Health webpage for the latest health notices. . If you have some symptoms but not all symptoms, continue to monitor at home and seek medical attention if your symptoms worsen. . If you are having a medical emergency, call 911.  HOME CARE . Only take medications as instructed by your  medical team. . Drink plenty of fluids and get plenty of rest. . A steam or ultrasonic humidifier can help if you have congestion.   GET HELP RIGHT AWAY IF YOU HAVE EMERGENCY WARNING SIGNS** FOR COVID-19. If you or someone is showing any of these signs seek emergency medical care immediately. Call 911 or proceed to your closest emergency facility if: . You develop worsening high fever. . Trouble breathing . Bluish lips or face . Persistent pain or pressure in the chest . New confusion . Inability to wake or stay awake . You cough up blood. . Your symptoms become more severe  **This list is not all possible symptoms. Contact your medical provider for any symptoms that are sever or concerning to you.   MAKE SURE YOU   Understand these instructions.  Will watch your condition.  Will get help right away if you are not doing well or get worse.  Your e-visit answers were reviewed by a board certified advanced clinical practitioner to complete your personal care plan.  Depending on the condition, your plan could have included both over the counter or prescription medications.  If there is a problem please reply once you have received a response from your provider.  Your safety is important to us.  If you have drug allergies check your prescription carefully.    You can use MyChart to ask questions about today's visit, request a non-urgent call back, or ask for a work or school excuse for 24 hours related to this e-Visit. If it has been greater than 24 hours you will need to follow up with your provider, or enter a new e-Visit to address those concerns. You will get an e-mail in the next two days asking about your experience.  I hope that your e-visit has been valuable and will speed your recovery. Thank you for using e-visits.   5-10 minutes spent reviewing and documenting in chart.  

## 2019-04-23 ENCOUNTER — Encounter (INDEPENDENT_AMBULATORY_CARE_PROVIDER_SITE_OTHER): Payer: Self-pay

## 2019-04-24 ENCOUNTER — Encounter (INDEPENDENT_AMBULATORY_CARE_PROVIDER_SITE_OTHER): Payer: Self-pay

## 2019-04-25 ENCOUNTER — Encounter (INDEPENDENT_AMBULATORY_CARE_PROVIDER_SITE_OTHER): Payer: Self-pay

## 2019-04-26 ENCOUNTER — Encounter (INDEPENDENT_AMBULATORY_CARE_PROVIDER_SITE_OTHER): Payer: Self-pay

## 2019-04-27 ENCOUNTER — Encounter (INDEPENDENT_AMBULATORY_CARE_PROVIDER_SITE_OTHER): Payer: Self-pay

## 2019-04-28 ENCOUNTER — Encounter (INDEPENDENT_AMBULATORY_CARE_PROVIDER_SITE_OTHER): Payer: Self-pay

## 2019-04-30 ENCOUNTER — Encounter (INDEPENDENT_AMBULATORY_CARE_PROVIDER_SITE_OTHER): Payer: Self-pay

## 2019-05-01 ENCOUNTER — Encounter (INDEPENDENT_AMBULATORY_CARE_PROVIDER_SITE_OTHER): Payer: Self-pay

## 2019-05-03 ENCOUNTER — Encounter (INDEPENDENT_AMBULATORY_CARE_PROVIDER_SITE_OTHER): Payer: Self-pay

## 2019-05-04 ENCOUNTER — Encounter (INDEPENDENT_AMBULATORY_CARE_PROVIDER_SITE_OTHER): Payer: Self-pay

## 2019-06-18 ENCOUNTER — Telehealth: Payer: Managed Care, Other (non HMO) | Admitting: Physician Assistant

## 2019-06-18 ENCOUNTER — Encounter: Payer: Self-pay | Admitting: Physician Assistant

## 2019-06-18 DIAGNOSIS — J029 Acute pharyngitis, unspecified: Secondary | ICD-10-CM | POA: Diagnosis not present

## 2019-06-18 DIAGNOSIS — B349 Viral infection, unspecified: Secondary | ICD-10-CM | POA: Diagnosis not present

## 2019-06-18 NOTE — Progress Notes (Signed)
We are sorry that you are not feeling well.  Here is how we plan to help!  Your symptoms indicate a likely viral infection (Pharyngitis).   Pharyngitis is inflammation in the back of the throat which can cause a sore throat, scratchiness and sometimes difficulty swallowing.   Pharyngitis is typically caused by a respiratory virus and will just run its course.  Please keep in mind that your symptoms could last up to 10 days.  For throat pain, we recommend over the counter oral pain relief medications such as acetaminophen, since you think you may be pregnant.  Topical treatments such as oral throat lozenges or sprays may be used as needed.  Avoid close contact with loved ones, especially the very young and elderly.  Remember to wash your hands thoroughly throughout the day as this is the number one way to prevent the spread of infection and wipe down door knobs and counters with disinfectant.  After careful review of your answers, I would not recommend and antibiotic for your condition.  Antibiotics should not be used to treat conditions that we suspect are caused by viruses like the virus that causes the common cold or flu. However, some people can have Strep with atypical symptoms. You may need formal testing in clinic or office to confirm if your symptoms continue or worsen.  Providers prescribe antibiotics to treat infections caused by bacteria. Antibiotics are very powerful in treating bacterial infections when they are used properly.  To maintain their effectiveness, they should be used only when necessary.  Overuse of antibiotics has resulted in the development of super bugs that are resistant to treatment!    Home Care:  Only take medications as instructed by your medical team.  Do not drink alcohol while taking these medications.  A steam or ultrasonic humidifier can help congestion.  You can place a towel over your head and breathe in the steam from hot water coming from a faucet.  Avoid  close contacts especially the very young and the elderly.  Cover your mouth when you cough or sneeze.  Always remember to wash your hands.  Get Help Right Away If:  You develop worsening fever or throat pain.  You develop a severe head ache or visual changes.  Your symptoms persist after you have completed your treatment plan.  Make sure you  Understand these instructions.  Will watch your condition.  Will get help right away if you are not doing well or get worse.  Your e-visit answers were reviewed by a board certified advanced clinical practitioner to complete your personal care plan.  Depending on the condition, your plan could have included both over the counter or prescription medications.  If there is a problem please reply  once you have received a response from your provider.  Your safety is important to [place].  If you have drug allergies check your prescription carefully.    You can use MyChart to ask questions about todays visit, request a non-urgent call back, or ask for a work or school excuse for 24 hours related to this e-Visit. If it has been greater than 24 hours you will need to follow up with your provider, or enter a new e-Visit to address those concerns.  You will get an e-mail in the next two days asking about your experience.  I hope that your e-visit has been valuable and will speed your recovery. Thank you for using e-visits.  Terald Sleeper PA-C  Approximately 5 minutes was  spent documenting and reviewing patient's chart.

## 2019-06-22 ENCOUNTER — Other Ambulatory Visit: Payer: Self-pay

## 2019-06-22 DIAGNOSIS — Z20822 Contact with and (suspected) exposure to covid-19: Secondary | ICD-10-CM

## 2019-06-24 LAB — NOVEL CORONAVIRUS, NAA: SARS-CoV-2, NAA: NOT DETECTED

## 2019-07-26 ENCOUNTER — Other Ambulatory Visit: Payer: Self-pay

## 2019-07-26 ENCOUNTER — Emergency Department (HOSPITAL_COMMUNITY)
Admission: EM | Admit: 2019-07-26 | Discharge: 2019-07-26 | Disposition: A | Discharge status: Home / Self Care | Payer: Managed Care, Other (non HMO) | Attending: Emergency Medicine | Admitting: Emergency Medicine

## 2019-07-26 ENCOUNTER — Emergency Department (HOSPITAL_COMMUNITY): Payer: Managed Care, Other (non HMO)

## 2019-07-26 DIAGNOSIS — R509 Fever, unspecified: Secondary | ICD-10-CM | POA: Diagnosis present

## 2019-07-26 DIAGNOSIS — Z20828 Contact with and (suspected) exposure to other viral communicable diseases: Secondary | ICD-10-CM | POA: Insufficient documentation

## 2019-07-26 DIAGNOSIS — J020 Streptococcal pharyngitis: Secondary | ICD-10-CM | POA: Diagnosis not present

## 2019-07-26 LAB — COMPREHENSIVE METABOLIC PANEL
ALT: 16 U/L (ref 0–44)
AST: 18 U/L (ref 15–41)
Albumin: 3.6 g/dL (ref 3.5–5.0)
Alkaline Phosphatase: 59 U/L (ref 38–126)
Anion gap: 11 (ref 5–15)
BUN: 8 mg/dL (ref 6–20)
CO2: 19 mmol/L — ABNORMAL LOW (ref 22–32)
Calcium: 8.9 mg/dL (ref 8.9–10.3)
Chloride: 105 mmol/L (ref 98–111)
Creatinine, Ser: 0.69 mg/dL (ref 0.44–1.00)
GFR calc Af Amer: >60 mL/min (ref >60)
GFR calc non Af Amer: >60 mL/min (ref >60)
Glucose, Bld: 107 mg/dL — ABNORMAL HIGH (ref 70–99)
Potassium: 3.3 mmol/L — ABNORMAL LOW (ref 3.5–5.1)
Sodium: 135 mmol/L (ref 135–145)
Total Bilirubin: 0.3 mg/dL (ref 0.3–1.2)
Total Protein: 7.4 g/dL (ref 6.5–8.1)

## 2019-07-26 LAB — CBC WITH DIFFERENTIAL/PLATELET
Abs Immature Granulocytes: 0.09 10*3/uL — ABNORMAL HIGH (ref 0.00–0.07)
Basophils Absolute: 0.1 10*3/uL (ref 0.0–0.1)
Basophils Relative: 0 %
Eosinophils Absolute: 0.2 10*3/uL (ref 0.0–0.5)
Eosinophils Relative: 1 %
HCT: 38.3 % (ref 36.0–46.0)
Hemoglobin: 12.3 g/dL (ref 12.0–15.0)
Immature Granulocytes: 1 %
Lymphocytes Relative: 14 %
Lymphs Abs: 2.5 10*3/uL (ref 0.7–4.0)
MCH: 28.9 pg (ref 26.0–34.0)
MCHC: 32.1 g/dL (ref 30.0–36.0)
MCV: 90.1 fL (ref 80.0–100.0)
Monocytes Absolute: 1 10*3/uL (ref 0.1–1.0)
Monocytes Relative: 6 %
Neutro Abs: 14 10*3/uL — ABNORMAL HIGH (ref 1.7–7.7)
Neutrophils Relative %: 78 %
Platelets: 276 10*3/uL (ref 150–400)
RBC: 4.25 MIL/uL (ref 3.87–5.11)
RDW: 13.6 % (ref 11.5–15.5)
WBC: 17.7 10*3/uL — ABNORMAL HIGH (ref 4.0–10.5)
nRBC: 0 % (ref 0.0–0.2)

## 2019-07-26 LAB — GROUP A STREP BY PCR: Group A Strep by PCR: DETECTED — AB

## 2019-07-26 LAB — I-STAT BETA HCG BLOOD, ED (MC, WL, AP ONLY): I-stat hCG, quantitative: <5 m[IU]/mL (ref <5)

## 2019-07-26 LAB — POC SARS CORONAVIRUS 2 AG -  ED: SARS Coronavirus 2 Ag: NEGATIVE

## 2019-07-26 MED ORDER — AMOXICILLIN 500 MG PO CAPS: 1000.0000 mg | ORAL_CAPSULE | Freq: Every day | ORAL | 0 refills | Status: AC

## 2019-07-26 MED ORDER — POTASSIUM CHLORIDE CRYS ER 20 MEQ PO TBCR
40.0000 meq | EXTENDED_RELEASE_TABLET | Freq: Once | ORAL | Status: AC
  Administered 2019-07-26: 40 meq via ORAL
  Filled 2019-07-26: qty 2

## 2019-07-26 MED ORDER — IBUPROFEN 200 MG PO TABS
600.0000 mg | ORAL_TABLET | Freq: Once | ORAL | Status: AC
  Administered 2019-07-26: 600 mg via ORAL
  Filled 2019-07-26: qty 3

## 2019-07-26 MED ORDER — AMOXICILLIN 500 MG PO CAPS
1000.0000 mg | ORAL_CAPSULE | Freq: Once | ORAL | Status: AC
  Administered 2019-07-26: 23:00:00 1000 mg via ORAL
  Filled 2019-07-26: qty 2

## 2019-07-26 MED ORDER — SODIUM CHLORIDE 0.9 % IV BOLUS
1000.0000 mL | Freq: Once | INTRAVENOUS | Status: AC
  Administered 2019-07-26: 20:00:00 1000 mL via INTRAVENOUS

## 2019-07-26 NOTE — Discharge Instructions (Addendum)
If you develop vomiting, trouble breathing, inability to swallow or trouble speaking, or any other new/concerning symptoms, then call 911 or return to the ER for evaluation.   Otherwise, take your antibiotics until completed, even if you are feeling better. Take ibuprofen and tylenol for fever and pain.  Please self-quarantine until your COVID has resulted.

## 2019-07-26 NOTE — ED Provider Notes (Signed)
[facility] DEPT Provider Note   CSN: [id number] Arrival date & time: 07/26/19  1856     History   Chief Complaint Chief Complaint  Patient presents with  . COVID Exposure    symptomatic    HPI Sandra Marsh is a 23 y.o. female.     HPI  23 year old female presents with a chief complaint of fever.  Patient woke up this morning with a sore throat but did not think much of it.  However while out shopping she noticed she was feeling worse and was feeling diffuse body aches and weakness.  Started to have some blurry vision/lightheadedness and eventually checked her temperature and it was 102.  Temperature ultimately went up to 103.  Did not take any treatments.  EMS gave 1 g Tylenol.  No vomiting.  Has headache but no neck pain.  No cough and has had sneezing in addition to the sore throat.  No vomiting, diarrhea, abdominal pain or dysuria. She felt short of breath when walking up the stairs, but now feels no dyspnea.  Past Medical History:  Diagnosis Date  . Anxiety 07/09/2018  . PCOS (polycystic ovarian syndrome)     Patient Active Problem List   Diagnosis Date Noted  . chest pain/ pressure  10/07/2018  . Shortness of breath at rest and with exertion  10/07/2018  . Anxiety 07/09/2018    No past surgical history on file.   OB History   No obstetric history on file.      Home Medications    Prior to Admission medications   Medication Sig Start Date End Date Taking? Authorizing Provider  albuterol (PROVENTIL HFA;VENTOLIN HFA) 108 (90 Base) MCG/ACT inhaler Inhale 2 puffs into the lungs every 6 (six) hours as needed for wheezing or shortness of breath. 10/07/18   Merlyn Lot, MD  amoxicillin (AMOXIL) 500 MG capsule Take 2 capsules (1,000 mg total) by mouth daily for 9 days. 07/27/19 08/05/19  Sherwood Gambler, MD  benzonatate (TESSALON PERLES) 100 MG capsule Take 1 capsule (100 mg total) by mouth 3 (three) times daily as needed for cough.  04/22/19 04/21/20  Hassell Done, Mary-Margaret, FNP  citalopram (CELEXA) 20 MG tablet Take 20 mg by mouth daily. 09/22/18   [provider]  citalopram (CELEXA) 20 MG tablet Take by mouth. 09/19/18   [provider]  fluticasone (FLONASE) 50 MCG/ACT nasal spray Place 2 sprays into both nostrils daily. 03/03/19 03/02/20  Laban Emperor, PA-C  HYDROcodone-acetaminophen (NORCO) 5-325 MG tablet Take 1 tablet by mouth every 4 (four) hours as needed for moderate pain. 10/07/18   Merlyn Lot, MD  ibuprofen (ADVIL) 800 MG tablet Take 800 mg by mouth every 8 (eight) hours as needed. for pain 01/15/19   [provider]  levonorgestrel (PLAN B 1-STEP) 1.5 MG tablet  01/20/19   [provider]  norethindrone-ethinyl estradiol (CYCLAFEM) 0.5/0.75/1-35 MG-MCG tablet Take 1 tablet by mouth daily.    [provider]  norethindrone-ethinyl estradiol (NORTREL 0.5/35, 28,) 0.5-35 MG-MCG tablet Take by mouth.    [provider]  oseltamivir (TAMIFLU) 75 MG capsule Take 1 capsule (75 mg total) by mouth 2 (two) times daily. 10/29/18   Flinchum, Kelby Aline, FNP  [facility] 7/7/7 0.5/0.75/1-35 MG-MCG tablet Take 1 tablet by mouth daily. 09/22/18   [provider]  traZODone (DESYREL) 100 MG tablet TAKE 1 TABLET BY MOUTH ONCE DAILY AFTER A MEAL 09/19/18   [provider]    Family History Family History  Problem  Relation Age of Onset  . Hypertension Paternal Grandmother     Social History Social History   Tobacco Use  . Smoking status: Never Smoker  . Smokeless tobacco: Never Used  Substance Use Topics  . Alcohol use: Never    Frequency: Never  . Drug use: Never     Allergies   Nickel   Review of Systems Review of Systems  Constitutional: Positive for fever.  HENT: Positive for sneezing and sore throat.   Respiratory: Positive for shortness of breath.   Gastrointestinal: Negative for abdominal pain, diarrhea and vomiting.  Genitourinary:  Negative for dysuria.  Musculoskeletal: Negative for neck pain and neck stiffness.  Neurological: Positive for dizziness and headaches.  All other systems reviewed and are negative.    Physical Exam Updated Vital Signs BP 119/71   Pulse (!) 106   Temp (!) 102.1 F (38.9 C) (Oral)   Resp 16   Ht 5' (1.524 m)   Wt 95.3 kg   LMP 07/12/2019   SpO2 99%   BMI 41.01 kg/m   Physical Exam Vitals signs and nursing note reviewed.  Constitutional:      General: She is not in acute distress.    Appearance: She is well-developed. She is not ill-appearing or diaphoretic.  HENT:     Head: Normocephalic and atraumatic.     Right Ear: External ear normal.     Left Ear: External ear normal.     Nose: Nose normal.     Mouth/Throat:     Pharynx: No oropharyngeal exudate or posterior oropharyngeal erythema.  Eyes:     General:        Right eye: No discharge.        Left eye: No discharge.     Extraocular Movements: Extraocular movements intact.     Pupils: Pupils are equal, round, and reactive to light.  Neck:     Musculoskeletal: Neck supple. No neck rigidity or muscular tenderness.  Cardiovascular:     Rate and Rhythm: Regular rhythm. Tachycardia present.     Heart sounds: Normal heart sounds.  Pulmonary:     Effort: Pulmonary effort is normal.     Breath sounds: Normal breath sounds. No wheezing, rhonchi or rales.  Abdominal:     General: There is no distension.     Palpations: Abdomen is soft.     Tenderness: There is no abdominal tenderness.  Skin:    General: Skin is warm and dry.  Neurological:     Mental Status: She is alert.  Psychiatric:        Mood and Affect: Mood is not anxious.      ED Treatments / Results  Labs (all labs ordered are listed, but only abnormal results are displayed) Labs Reviewed  GROUP A STREP BY PCR - Abnormal; Notable for the following components:      Result Value   Group A Strep by PCR DETECTED (*)    All other components within normal  limits  COMPREHENSIVE METABOLIC PANEL - Abnormal; Notable for the following components:   Potassium 3.3 (*)    CO2 19 (*)    Glucose, Bld 107 (*)    All other components within normal limits  CBC WITH DIFFERENTIAL/PLATELET - Abnormal; Notable for the following components:   WBC 17.7 (*)    Neutro Abs 14.0 (*)    Abs Immature Granulocytes 0.09 (*)    All other components within normal limits  SARS CORONAVIRUS 2 (TAT 6-24 HRS)  I-STAT  BETA HCG BLOOD, ED ([facility], [facility], [facility] ONLY)  POC SARS CORONAVIRUS 2 AG -  ED    EKG None  Radiology Dg Chest Portable 1 View  Result Date: 07/26/2019 CLINICAL DATA:  Arrived by EMS from home with c/o generalized body aches, generalized weakness, fever 102.0 (at home), sore throat. 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 No mediastinal or hilar masses. No evidence of adenopathy. Clear lungs.  No pleural effusion or pneumothorax. Skeletal structures are unremarkable. IMPRESSION: No active disease. Electronically Signed   By: Lajean Manes M.D.   On: 07/26/2019 20:36    Procedures Procedures (including critical care time)  Medications Ordered in ED Medications  amoxicillin (AMOXIL) capsule 1,000 mg (has no administration in time range)  sodium chloride 0.9 % bolus 1,000 mL (0 mLs Intravenous Stopped 07/26/19 2042)  ibuprofen (ADVIL) tablet 600 mg (600 mg Oral Given 07/26/19 2029)  potassium chloride SA (KLOR-CON) CR tablet 40 mEq (40 mEq Oral Given 07/26/19 2113)     Initial Impression / Assessment and Plan / ED Course  I have reviewed the triage vital signs and the nursing notes.  Pertinent labs & imaging results that were available during my care of the patient were reviewed by me and considered in my medical decision making (see chart for details).        Patient is overall well appearing. Tachycardia mostly appears to be from  fever. Feels much better with fluids and ibuprofen. Highly doubt deep space neck infection. No obvious PTA. HR has improved. K repleted. Has WBC of 17 but given well appearance, I think we can treat for strep and I discussed strict return precautions. Will test for covid, but likely strep is cause.   Sharlee Polselli was evaluated in Emergency Department on 07/26/2019 for the symptoms described in the history of present illness. She was evaluated in the context of the global COVID-19 pandemic, which necessitated consideration that the patient might be at risk for infection with the SARS-CoV-2 virus that causes COVID-19. Institutional protocols and algorithms that pertain to the evaluation of patients at risk for COVID-19 are in a state of rapid change based on information released by regulatory bodies including the CDC and federal and state organizations. These policies and algorithms were followed during the patient's care in the ED.   Final Clinical Impressions(s) / ED Diagnoses   Final diagnoses:  Strep pharyngitis    ED Discharge Orders         Ordered    amoxicillin (AMOXIL) 500 MG capsule  Daily     07/26/19 2208           Sherwood Gambler, MD 07/26/19 2220

## 2019-07-26 NOTE — ED Triage Notes (Signed)
Arrived by EMS from home with c/o generalized body aches, generalized weakness, fever 102.0 (at home), sore throat. 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 Received 1000 mg Tylenol en route to this facility. A&O X4. NAD

## 2019-07-27 LAB — SARS CORONAVIRUS 2 (TAT 6-24 HRS): SARS Coronavirus 2: NEGATIVE

## 2019-09-07 ENCOUNTER — Other Ambulatory Visit: Payer: Self-pay

## 2019-09-07 ENCOUNTER — Emergency Department
Admission: EM | Admit: 2019-09-07 | Discharge: 2019-09-07 | Disposition: A | Discharge status: Other Acute Inpatient Hospital | Payer: 59 | Source: Home / Self Care | Attending: Emergency Medicine | Admitting: Emergency Medicine

## 2019-09-07 ENCOUNTER — Inpatient Hospital Stay
Admission: RE | Admit: 2019-09-07 | Discharge: 2019-09-09 | DRG: 885 | Disposition: A | Discharge status: Home / Self Care | Payer: 59 | Source: Intra-hospital | Attending: Psychiatry | Admitting: Psychiatry

## 2019-09-07 ENCOUNTER — Encounter: Payer: Self-pay | Admitting: Psychiatry

## 2019-09-07 DIAGNOSIS — Z20822 Contact with and (suspected) exposure to covid-19: Secondary | ICD-10-CM | POA: Insufficient documentation

## 2019-09-07 DIAGNOSIS — Z79899 Other long term (current) drug therapy: Secondary | ICD-10-CM | POA: Insufficient documentation

## 2019-09-07 DIAGNOSIS — R45851 Suicidal ideations: Secondary | ICD-10-CM | POA: Diagnosis present

## 2019-09-07 DIAGNOSIS — F329 Major depressive disorder, single episode, unspecified: Secondary | ICD-10-CM | POA: Insufficient documentation

## 2019-09-07 DIAGNOSIS — F4310 Post-traumatic stress disorder, unspecified: Secondary | ICD-10-CM | POA: Diagnosis present

## 2019-09-07 DIAGNOSIS — Z23 Encounter for immunization: Secondary | ICD-10-CM | POA: Insufficient documentation

## 2019-09-07 DIAGNOSIS — F4312 Post-traumatic stress disorder, chronic: Secondary | ICD-10-CM | POA: Diagnosis not present

## 2019-09-07 DIAGNOSIS — X781XXA Intentional self-harm by knife, initial encounter: Secondary | ICD-10-CM | POA: Insufficient documentation

## 2019-09-07 DIAGNOSIS — Z888 Allergy status to other drugs, medicaments and biological substances status: Secondary | ICD-10-CM

## 2019-09-07 DIAGNOSIS — F332 Major depressive disorder, recurrent severe without psychotic features: Secondary | ICD-10-CM | POA: Diagnosis present

## 2019-09-07 DIAGNOSIS — Y92008 Other place in unspecified non-institutional (private) residence as the place of occurrence of the external cause: Secondary | ICD-10-CM | POA: Insufficient documentation

## 2019-09-07 DIAGNOSIS — Z87891 Personal history of nicotine dependence: Secondary | ICD-10-CM

## 2019-09-07 DIAGNOSIS — E282 Polycystic ovarian syndrome: Secondary | ICD-10-CM | POA: Diagnosis present

## 2019-09-07 DIAGNOSIS — Y999 Unspecified external cause status: Secondary | ICD-10-CM | POA: Insufficient documentation

## 2019-09-07 DIAGNOSIS — Y9389 Activity, other specified: Secondary | ICD-10-CM | POA: Insufficient documentation

## 2019-09-07 DIAGNOSIS — G4700 Insomnia, unspecified: Secondary | ICD-10-CM | POA: Diagnosis present

## 2019-09-07 DIAGNOSIS — Z7289 Other problems related to lifestyle: Secondary | ICD-10-CM

## 2019-09-07 HISTORY — DX: Major depressive disorder, single episode, unspecified: F32.9

## 2019-09-07 LAB — URINE DRUG SCREEN, QUALITATIVE (ARMC ONLY)
Amphetamines, Ur Screen: NOT DETECTED
Barbiturates, Ur Screen: NOT DETECTED
Benzodiazepine, Ur Scrn: NOT DETECTED
Cannabinoid 50 Ng, Ur ~~LOC~~: POSITIVE — AB
Cocaine Metabolite,Ur ~~LOC~~: NOT DETECTED
MDMA (Ecstasy)Ur Screen: NOT DETECTED
Methadone Scn, Ur: NOT DETECTED
Opiate, Ur Screen: NOT DETECTED
Phencyclidine (PCP) Ur S: NOT DETECTED
Tricyclic, Ur Screen: NOT DETECTED

## 2019-09-07 LAB — CBC
HCT: 40.9 % (ref 36.0–46.0)
Hemoglobin: 13 g/dL (ref 12.0–15.0)
MCH: 28.1 pg (ref 26.0–34.0)
MCHC: 31.8 g/dL (ref 30.0–36.0)
MCV: 88.5 fL (ref 80.0–100.0)
Platelets: 356 10*3/uL (ref 150–400)
RBC: 4.62 MIL/uL (ref 3.87–5.11)
RDW: 14 % (ref 11.5–15.5)
WBC: 11.9 10*3/uL — ABNORMAL HIGH (ref 4.0–10.5)
nRBC: 0 % (ref 0.0–0.2)

## 2019-09-07 LAB — RESPIRATORY PANEL BY RT PCR (FLU A&B, COVID)
Influenza A by PCR: NEGATIVE
Influenza B by PCR: NEGATIVE
SARS Coronavirus 2 by RT PCR: NEGATIVE

## 2019-09-07 LAB — ACETAMINOPHEN LEVEL: Acetaminophen (Tylenol), Serum: <10 ug/mL — ABNORMAL LOW (ref 10–30)

## 2019-09-07 LAB — COMPREHENSIVE METABOLIC PANEL
ALT: 18 U/L (ref 0–44)
AST: 23 U/L (ref 15–41)
Albumin: 3.6 g/dL (ref 3.5–5.0)
Alkaline Phosphatase: 59 U/L (ref 38–126)
Anion gap: 11 (ref 5–15)
BUN: 12 mg/dL (ref 6–20)
CO2: 19 mmol/L — ABNORMAL LOW (ref 22–32)
Calcium: 9.1 mg/dL (ref 8.9–10.3)
Chloride: 106 mmol/L (ref 98–111)
Creatinine, Ser: 0.7 mg/dL (ref 0.44–1.00)
GFR calc Af Amer: >60 mL/min (ref >60)
GFR calc non Af Amer: >60 mL/min (ref >60)
Glucose, Bld: 97 mg/dL (ref 70–99)
Potassium: 3.9 mmol/L (ref 3.5–5.1)
Sodium: 136 mmol/L (ref 135–145)
Total Bilirubin: 0.5 mg/dL (ref 0.3–1.2)
Total Protein: 7.2 g/dL (ref 6.5–8.1)

## 2019-09-07 LAB — SALICYLATE LEVEL: Salicylate Lvl: <7 mg/dL — ABNORMAL LOW (ref 7.0–30.0)

## 2019-09-07 LAB — ETHANOL: Alcohol, Ethyl (B): <10 mg/dL (ref <10)

## 2019-09-07 LAB — POCT PREGNANCY, URINE: Preg Test, Ur: NEGATIVE

## 2019-09-07 MED ORDER — HYDROXYZINE HCL 25 MG PO TABS: 25.0000 mg | ORAL_TABLET | Freq: Three times a day (TID) | ORAL | Status: DC | PRN

## 2019-09-07 MED ORDER — HYDROXYZINE HCL 50 MG PO TABS
50.0000 mg | ORAL_TABLET | Freq: Every evening | ORAL | Status: DC | PRN
  Administered 2019-09-08: 22:00:00 50 mg via ORAL
  Filled 2019-09-07: qty 1

## 2019-09-07 MED ORDER — ALUM & MAG HYDROXIDE-SIMETH 200-200-20 MG/5ML PO SUSP: 30.0000 mL | ORAL | Status: DC | PRN

## 2019-09-07 MED ORDER — BUPROPION HCL ER (XL) 150 MG PO TB24
150.0000 mg | ORAL_TABLET | Freq: Every day | ORAL | Status: DC
  Administered 2019-09-08 – 2019-09-09 (×2): 150 mg via ORAL
  Filled 2019-09-07 (×2): qty 1

## 2019-09-07 MED ORDER — TETANUS-DIPHTH-ACELL PERTUSSIS 5-2.5-18.5 LF-MCG/0.5 IM SUSP
0.5000 mL | Freq: Once | INTRAMUSCULAR | Status: AC
  Administered 2019-09-07: 0.5 mL via INTRAMUSCULAR
  Filled 2019-09-07: qty 0.5

## 2019-09-07 MED ORDER — CITALOPRAM HYDROBROMIDE 20 MG PO TABS: 20.0000 mg | ORAL_TABLET | Freq: Every day | ORAL | Status: DC

## 2019-09-07 MED ORDER — TRAZODONE HCL 50 MG PO TABS
50.0000 mg | ORAL_TABLET | Freq: Every day | ORAL | Status: DC
  Administered 2019-09-07: 22:00:00 50 mg via ORAL
  Filled 2019-09-07: qty 1

## 2019-09-07 MED ORDER — ACETAMINOPHEN 325 MG PO TABS: 650.0000 mg | ORAL_TABLET | Freq: Four times a day (QID) | ORAL | Status: DC | PRN

## 2019-09-07 MED ORDER — TRAZODONE HCL 100 MG PO TABS: 100.0000 mg | ORAL_TABLET | Freq: Every day | ORAL | Status: DC

## 2019-09-07 MED ORDER — LAMOTRIGINE 25 MG PO TABS
25.0000 mg | ORAL_TABLET | Freq: Every day | ORAL | Status: DC
  Administered 2019-09-07 – 2019-09-09 (×3): 25 mg via ORAL
  Filled 2019-09-07 (×3): qty 1

## 2019-09-07 MED ORDER — MAGNESIUM HYDROXIDE 400 MG/5ML PO SUSP: 30.0000 mL | Freq: Every day | ORAL | Status: DC | PRN

## 2019-09-07 NOTE — [facility] Group Notes (Signed)

## 2019-09-07 NOTE — Plan of Care (Signed)
New admission.   Problem: Education: Goal: Knowledge of [facility] Education information/materials will improve Outcome: Not Progressing Goal: Emotional status will improve Outcome: Not Progressing Goal: Mental status will improve Outcome: Not Progressing Goal: Verbalization of understanding the information provided will improve Outcome: Not Progressing   Problem: Safety: Goal: Periods of time without injury will increase Outcome: Not Progressing   Problem: Self-Concept: Goal: Ability to disclose and discuss suicidal ideas will improve Outcome: Not Progressing   Problem: Coping: Goal: Coping ability will improve Outcome: Not Progressing Goal: Will verbalize feelings Outcome: Not Progressing   Problem: Self-Concept: Goal: Level of anxiety will decrease Outcome: Not Progressing

## 2019-09-07 NOTE — Progress Notes (Signed)
Admission Note:   Report was received from Amy, RN on a 24 year old female who presents Voluntary in no acute distress for the treatment of SI and Depression. Patient appears flat and depressed. Patient was calm and cooperative with admission process. Patient presents with passive SI and contracts for safety upon admission. Patient denies HI/AVH. Patient endorsed depression and anxiety, rating them a "7/10" and "8/10", reporting that "work stressors" and "familial relationships" are making her feel this way. Patient has a past medical history of  Depression and Anxiety. Patient's goals while here are to "establish personal boundaries with work and personal life" as well as "coping skills to help manage stress". Skin was assessed with Gigi, RN and found to be clear of any abnormal marks apart from a superficial laceration to her right wrist and multiple tattoos. Patient searched and no contraband found and unit policies explained and understanding verbalized. Consents obtained. Food and fluids offered, and fluids accepted. Patient had no additional questions or concerns to voice at this time.

## 2019-09-07 NOTE — ED Notes (Signed)
Pt transferred into ED BHU room 5   Patient assigned to appropriate care area. I oriented her to unit/care area: Informed that, for her safety, care areas are designed for safety and monitored by security cameras at all times; Visiting hours/covid restrictions and phone times explained to patient. Patient verbalizes understanding, and verbal contract for safety obtained.    Assessment completed  She denies pain  Psych consult pending

## 2019-09-07 NOTE — Consult Note (Signed)
[facility] Face-to-Face Psychiatry Consult   Reason for Consult: Depression and SI Referring Physician: Dr. Jacqualine Code Patient Identification: Sandra Marsh MRN:  [id number] Principal Diagnosis: <principal problem not specified> Diagnosis:  Active Problems:   * No active hospital problems. *   Total Time spent with patient: 30 minutes  Subjective:   Sandra Marsh is a 24 y.o. female patient admitted with depression and SI.  HPI: Patient is a 24 year old female with a past psychiatric history of depression who presents with symptoms of worsening depression for the past several weeks.  Patient states that despite compliance with her medications prescribed by her primary care doctor, which were continued from her previous psychiatrist, patient is still experiencing symptoms of depression.  Symptoms include low mood, low energy, poor motivation, feelings of being overwhelmed as well as most importantly suicidal ideation.  Patient reports having thoughts of attempting to overdose on trazodone to kill herself.  Patient states that this previous Monday she was planning on going forth with her plan but due to a good day at work decided to not take the overdose.  Patient then went on the week with some improvement however was unable to sustain her improvement as she quickly found herself debating suicide and crying throughout the end of the week.  Patient states this culminated last night where she attempted to cut her wrist with a knife from her kitchen.  Patient shows Probation officer 5 superficial scratches on her right wrist. Patient states there is no more stressors in her life.  She cites work stress as her job has recently intensified to do a promotion several months ago.  She also cites familial stress as her mother lives approximately 2 hours away and they do not have the best relationship.  Patient denies any relationship stress.  Denies feeling lonely despite living alone and away from her family.  Patient does endorse  smoking marijuana every day.  Past Psychiatric History: Patient has seen a psychiatrist approximately 1 year ago and was placed on medications.  They have since been filled by her primary care doctor.  She has no prior hospitalizations.  Risk to Self:  Yes Risk to Others:  No Prior Inpatient Therapy:  No Prior Outpatient Therapy:  Yes  Past Medical History:  Past Medical History:  Diagnosis Date  . Anxiety 07/09/2018  . MDD (major depressive disorder) 09/07/2019  . PCOS (polycystic ovarian syndrome)    History reviewed. No pertinent surgical history. Family History:  Family History  Problem Relation Age of Onset  . Hypertension Paternal Grandmother    Family Psychiatric  History: Denies Social History:  Social History   Substance and Sexual Activity  Alcohol Use Never     Social History   Substance and Sexual Activity  Drug Use Never    Social History   Socioeconomic History  . Marital status: Single    Spouse name: Not on file  . Number of children: Not on file  . Years of education: Not on file  . Highest education level: Not on file  Occupational History  . Not on file  Tobacco Use  . Smoking status: Former Research scientist (life sciences)  . Smokeless tobacco: Never Used  Substance and Sexual Activity  . Alcohol use: Never  . Drug use: Never  . Sexual activity: Yes  Other Topics Concern  . Not on file  Social History Narrative  . Not on file   Social Determinants of Health   Financial Resource Strain:   . Difficulty of Paying Living Expenses:  Not on file  Food Insecurity:   . Worried About Charity fundraiser in the Last Year: Not on file  . Ran Out of Food in the Last Year: Not on file  Transportation Needs:   . Lack of Transportation (Medical): Not on file  . Lack of Transportation (Non-Medical): Not on file  Physical Activity:   . Days of Exercise per Week: Not on file  . Minutes of Exercise per Session: Not on file  Stress:   . Feeling of Stress : Not on file  Social  Connections:   . Frequency of Communication with Friends and Family: Not on file  . Frequency of Social Gatherings with Friends and Family: Not on file  . Attends Religious Services: Not on file  . Active Member of Clubs or Organizations: Not on file  . Attends Archivist Meetings: Not on file  . Marital Status: Not on file   Additional Social History: Patient lives alone.  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    Allergies:   Allergies  Allergen Reactions  . Nickel Rash    Labs:  Results for orders placed or performed during the hospital encounter of 09/07/19 (from the past 48 hour(s))  Comprehensive metabolic panel     Status: Abnormal   Collection Time: 09/07/19  5:47 AM  Result Value Ref Range   Sodium 136 135 - 145 mmol/L   Potassium 3.9 3.5 - 5.1 mmol/L   Chloride 106 98 - 111 mmol/L   CO2 19 (L) 22 - 32 mmol/L   Glucose, Bld 97 70 - 99 mg/dL   BUN 12 6 - 20 mg/dL   Creatinine, Ser 0.70 0.44 - 1.00 mg/dL   Calcium 9.1 8.9 - 10.3 mg/dL   Total Protein 7.2 6.5 - 8.1 g/dL   Albumin 3.6 3.5 - 5.0 g/dL   AST 23 15 - 41 U/L   ALT 18 0 - 44 U/L   Alkaline Phosphatase 59 38 - 126 U/L   Total Bilirubin 0.5 0.3 - 1.2 mg/dL   GFR calc non Af Amer >60 >60 mL/min   GFR calc Af Amer >60 >60 mL/min   Anion gap 11 5 - 15    Comment: Performed at [facility], [address] [place], [place] [address]  Ethanol     Status: None   Collection Time: 09/07/19  5:47 AM  Result Value Ref Range   Alcohol, Ethyl (B) <10 <10 mg/dL    Comment: (NOTE) Lowest detectable limit for serum alcohol is 10 mg/dL. For medical purposes only. Performed at [facility], [address] [place], [place] [id number]   Salicylate level     Status: Abnormal   Collection Time: 09/07/19  5:47 AM  Result Value Ref Range   Salicylate Lvl Q000111Q (L) 7.0 - 30.0 mg/dL    Comment: Performed at [facility], [address] [place],   [address]  Acetaminophen level     Status: Abnormal   Collection Time: 09/07/19  5:47 AM  Result Value Ref Range   Acetaminophen (Tylenol), Serum <10 (L) 10 - 30 ug/mL    Comment: (NOTE) Therapeutic concentrations vary significantly. A range of 10-30 ug/mL  may be an effective concentration for many patients. However, some  are best treated at concentrations outside of this range. Acetaminophen concentrations >150 ug/mL at 4 hours after ingestion  and >50 ug/mL at 12 hours after ingestion are often associated with  toxic reactions. Performed at [facility], [address] [place], [place] [address]   cbc     Status: Abnormal   Collection Time: 09/07/19  5:47 AM  Result Value Ref Range   WBC 11.9 (H) 4.0 - 10.5 K/uL   RBC 4.62 3.87 - 5.11 MIL/uL   Hemoglobin 13.0 12.0 - 15.0 g/dL   HCT 40.9 36.0 - 46.0 %   MCV 88.5 80.0 - 100.0 fL   MCH 28.1 26.0 - 34.0 pg   MCHC 31.8 30.0 - 36.0 g/dL   RDW 14.0 11.5 - 15.5 %   Platelets 356 150 - 400 K/uL   nRBC 0.0 0.0 - 0.2 %    Comment: Performed at [facility], [address], [place], [place] [address]  Urine Drug Screen, Qualitative     Status: Abnormal   Collection Time: 09/07/19  8:00 AM  Result Value Ref Range   Tricyclic, Ur Screen NONE DETECTED NONE DETECTED   Amphetamines, Ur Screen NONE DETECTED NONE DETECTED   MDMA (Ecstasy)Ur Screen NONE DETECTED NONE DETECTED   Cocaine Metabolite,Ur [place] NONE DETECTED NONE DETECTED   Opiate, Ur Screen NONE DETECTED NONE DETECTED   Phencyclidine (PCP) Ur S NONE DETECTED NONE DETECTED   Cannabinoid 50 Ng, Ur [place] POSITIVE (A) NONE DETECTED   Barbiturates, Ur Screen NONE DETECTED NONE DETECTED   Benzodiazepine, Ur Scrn NONE DETECTED NONE DETECTED   Methadone Scn, Ur NONE DETECTED NONE DETECTED    Comment: (NOTE) Tricyclics + metabolites, urine    Cutoff 1000 ng/mL Amphetamines + metabolites, urine  Cutoff 1000 ng/mL MDMA (Ecstasy), urine              Cutoff 500 ng/mL Cocaine  Metabolite, urine          Cutoff 300 ng/mL Opiate + metabolites, urine        Cutoff 300 ng/mL Phencyclidine (PCP), urine         Cutoff 25 ng/mL Cannabinoid, urine                 Cutoff 50 ng/mL Barbiturates + metabolites, urine  Cutoff 200 ng/mL Benzodiazepine, urine              Cutoff 200 ng/mL Methadone, urine                   Cutoff 300 ng/mL The urine drug screen provides only a preliminary, unconfirmed analytical test result and should not be used for non-medical purposes. Clinical consideration and professional judgment should be applied to any positive drug screen result due to possible interfering substances. A more specific alternate chemical method must be used in order to obtain a confirmed analytical result. Gas chromatography / mass spectrometry (GC/MS) is the preferred confirmat ory method. Performed at [facility], [address] [place], [place] [address]   Pregnancy, urine POC     Status: None   Collection Time: 09/07/19  8:05 AM  Result Value Ref Range   Preg Test, Ur NEGATIVE NEGATIVE    Comment:        THE SENSITIVITY OF THIS METHODOLOGY IS >24 mIU/mL   Respiratory Panel by RT PCR (Flu A&B, Covid) - Nasopharyngeal Swab     Status: None   Collection Time: 09/07/19  8:15 AM   Specimen: Nasopharyngeal Swab  Result Value Ref Range   SARS Coronavirus 2 by RT PCR NEGATIVE NEGATIVE    Comment: (NOTE) SARS-CoV-2 target nucleic acids are NOT DETECTED. The SARS-CoV-2 RNA is generally  detectable in upper respiratoy specimens during the acute phase of infection. The lowest concentration of SARS-CoV-2 viral copies this assay can detect is 131 copies/mL. A negative result does not preclude SARS-Cov-2 infection and should not be used as the sole basis for treatment or other patient management decisions. A negative result may occur with  improper specimen collection/handling, submission of specimen other than nasopharyngeal swab, presence of viral  mutation(s) within the areas targeted by this assay, and inadequate number of viral copies (<131 copies/mL). A negative result must be combined with clinical observations, patient history, and epidemiological information. The expected result is Negative. Fact Sheet for Patients:  PinkCheek.be Fact Sheet for Healthcare Providers:  GravelBags.it This test is not yet ap proved or cleared by the [place] FDA and  has been authorized for detection and/or diagnosis of SARS-CoV-2 by FDA under an Emergency Use Authorization (EUA). This EUA will remain  in effect (meaning this test can be used) for the duration of the COVID-19 declaration under Section 564(b)(1) of the Act, 21 U.S.C. section 360bbb-3(b)(1), unless the authorization is terminated or revoked sooner.    Influenza A by PCR NEGATIVE NEGATIVE   Influenza B by PCR NEGATIVE NEGATIVE    Comment: (NOTE) The Xpert Xpress SARS-CoV-2/FLU/RSV assay is intended as an aid in  the diagnosis of influenza from Nasopharyngeal swab specimens and  should not be used as a sole basis for treatment. Nasal washings and  aspirates are unacceptable for Xpert Xpress SARS-CoV-2/FLU/RSV  testing. Fact Sheet for Patients: PinkCheek.be Fact Sheet for Healthcare Providers: GravelBags.it This test is not yet approved or cleared by the [place] FDA and  has been authorized for detection and/or diagnosis of SARS-CoV-2 by  FDA under an Emergency Use Authorization (EUA). This EUA will remain  in effect (meaning this test can be used) for the duration of the  Covid-19 declaration under Section 564(b)(1) of the Act, 21  U.S.C. section 360bbb-3(b)(1), unless the authorization is  terminated or revoked. Performed at [facility], [address] [place], [place] [address]     No current facility-administered medications for this  encounter.   No current outpatient medications on file.   Facility-Administered Medications Ordered in Other Encounters  Medication Dose Route Frequency Provider Last Rate Last Admin  . acetaminophen (TYLENOL) tablet 650 mg  650 mg Oral Q6H PRN Oseph Imburgia, Dorene Ar, MD      . alum & mag hydroxide-simeth (MAALOX/MYLANTA) 200-200-20 MG/5ML suspension 30 mL  30 mL Oral Q4H PRN Enio Hornback, Dorene Ar, MD      . citalopram (CELEXA) tablet 20 mg  20 mg Oral Daily Aemilia Dedrick A, MD      . magnesium hydroxide (MILK OF MAGNESIA) suspension 30 mL  30 mL Oral Daily PRN Farhad Burleson, Dorene Ar, MD      . traZODone (DESYREL) tablet 100 mg  100 mg Oral QHS Colonel Krauser, Dorene Ar, MD        Musculoskeletal: Strength & Muscle Tone: within normal limits Gait & Station: normal Patient leans: N/A  Psychiatric Specialty Exam: Physical Exam  Review of Systems  Blood pressure (!) 133/93, pulse 80, temperature 98.2 F (36.8 C), temperature source Oral, resp. rate 18, height 5' (1.524 m), weight 90.7 kg, last menstrual period 09/06/2019, SpO2 98 %.Body mass index is 39.06 kg/m.  General Appearance: Casual  Eye Contact:  Fair  Speech:  Clear and Coherent  Volume:  Normal  Mood:  Anxious  Affect:  Congruent  Thought Process:  Coherent  Orientation:  Full (Time, Place, and Person)  Thought Content:  Logical  Suicidal Thoughts:  Yes.  with intent/plan  Homicidal Thoughts:  No  Memory:  Recent;   Fair  Judgement:  Impaired  Insight:  Fair  Psychomotor Activity:  Negative  Concentration:  Concentration: Fair  Recall:  [employer] of Knowledge:  Fair  Language:  Fair  Akathisia:  No  Handed:  Right  AIMS (if indicated):     Assets:  Communication Skills Desire for Improvement Financial Resources/Insurance [place] Talents/Skills Transportation Vocational/Educational  ADL's:  Intact  Cognition:  WNL  Sleep:        Treatment Plan Summary: 24 year old female  with recurrent major depressive disorder.  Patient is presenting with these signs and symptoms as depression despite being on medication.  She also endorses to near suicide attempts and is unable to contract for safety.  Patient will require inpatient hospitalization for safety, stabilization, and medication management.  Diagnosis: MDD  Medications: We will continue home dose of medications   Disposition: Recommend psychiatric Inpatient admission when medically cleared.  Dixie Dials, MD 09/07/2019 2:34 PM

## 2019-09-07 NOTE — ED Provider Notes (Signed)
[facility] Emergency Department Provider Note  ____________________________________________   First MD Initiated Contact with Patient 09/07/19 [phone]     (approximate)  I have reviewed the triage vital signs and the nursing notes.   HISTORY  Chief Complaint Suicidal    HPI Sandra Marsh is a 24 y.o. female  Here with suicidal ideation. Pt reports a h/o borderline personaly, bipolar d/o, two prior suicide attempts. She reports she's been under increased stress recently at work after a promotion, as well as feeling isolated Over the past week, she's had increasing thoughts about kiling herself, including a plan to take all her trazodone pills. Earlier [employer], she felt very overcome with emotion and tried to cut herself, though she was unable to kill herself due to the kitchen knives being blunt. She decided to come in for evaluation. Denies prior psych hospitalization. No other complaints. Tetanus unknown.        Past Medical History:  Diagnosis Date  . Anxiety 07/09/2018  . PCOS (polycystic ovarian syndrome)     Patient Active Problem List   Diagnosis Date Noted  . chest pain/ pressure  10/07/2018  . Shortness of breath at rest and with exertion  10/07/2018  . Anxiety 07/09/2018    History reviewed. No pertinent surgical history.  Prior to Admission medications   Medication Sig Start Date End Date Taking? Authorizing Provider  albuterol (PROVENTIL HFA;VENTOLIN HFA) 108 (90 Base) MCG/ACT inhaler Inhale 2 puffs into the lungs every 6 (six) hours as needed for wheezing or shortness of breath. 10/07/18   Merlyn Lot, MD  benzonatate (TESSALON PERLES) 100 MG capsule Take 1 capsule (100 mg total) by mouth 3 (three) times daily as needed for cough. 04/22/19 04/21/20  Hassell Done, Mary-Margaret, FNP  citalopram (CELEXA) 20 MG tablet Take 20 mg by mouth daily. 09/22/18   [provider]  citalopram (CELEXA) 20 MG tablet Take by mouth. 09/19/18   [provider]  fluticasone (FLONASE) 50 MCG/ACT nasal spray Place 2 sprays into both nostrils daily. 03/03/19 03/02/20  Laban Emperor, PA-C  HYDROcodone-acetaminophen (NORCO) 5-325 MG tablet Take 1 tablet by mouth every 4 (four) hours as needed for moderate pain. 10/07/18   Merlyn Lot, MD  ibuprofen (ADVIL) 800 MG tablet Take 800 mg by mouth every 8 (eight) hours as needed. for pain 01/15/19   [provider]  levonorgestrel (PLAN B 1-STEP) 1.5 MG tablet  01/20/19   [provider]  norethindrone-ethinyl estradiol (CYCLAFEM) 0.5/0.75/1-35 MG-MCG tablet Take 1 tablet by mouth daily.    [provider]  norethindrone-ethinyl estradiol (NORTREL 0.5/35, 28,) 0.5-35 MG-MCG tablet Take by mouth.    [provider]  oseltamivir (TAMIFLU) 75 MG capsule Take 1 capsule (75 mg total) by mouth 2 (two) times daily. 10/29/18   Flinchum, Kelby Aline, FNP  [facility] 7/7/7 0.5/0.75/1-35 MG-MCG tablet Take 1 tablet by mouth daily. 09/22/18   [provider]  traZODone (DESYREL) 100 MG tablet TAKE 1 TABLET BY MOUTH ONCE DAILY AFTER A MEAL 09/19/18   [provider]    Allergies Nickel  Family History  Problem Relation Age of Onset  . Hypertension Paternal Grandmother     Social History Social History   Tobacco Use  . Smoking status: Former Research scientist (life sciences)  . Smokeless tobacco: Never Used  Substance Use Topics  . Alcohol use: Never  . Drug use: Never    Review of Systems  Review of Systems  Constitutional: Negative for chills and fever.  HENT: Negative  for sore throat.   Respiratory: Negative for shortness of breath.   Cardiovascular: Negative for chest pain.  Gastrointestinal: Negative for abdominal pain.  Genitourinary: Negative for flank pain.  Musculoskeletal: Negative for neck pain.  Skin: Negative for rash and wound.  Allergic/Immunologic: Negative for immunocompromised state.  Neurological: Negative for weakness and numbness.  Hematological:  Does not bruise/bleed easily.  Psychiatric/Behavioral: Positive for self-injury and suicidal ideas.  All other systems reviewed and are negative.    ____________________________________________  PHYSICAL EXAM:      VITAL SIGNS: ED Triage Vitals  Enc Vitals Group     BP 09/07/19 0535 (!) 132/100     Pulse Rate 09/07/19 0535 93     Resp 09/07/19 0535 18     Temp 09/07/19 0535 98.7 F (37.1 C)     Temp src --      SpO2 09/07/19 0535 97 %     Weight 09/07/19 0536 200 lb (90.7 kg)     Height 09/07/19 0536 5' (1.524 m)     Head Circumference --      Peak Flow --      Pain Score 09/07/19 0536 0     Pain Loc --      Pain Edu? --      Excl. in [place]? --      Physical Exam Vitals and nursing note reviewed.  Constitutional:      General: She is not in acute distress.    Appearance: She is well-developed.  HENT:     Head: Normocephalic and atraumatic.  Eyes:     Conjunctiva/sclera: Conjunctivae normal.  Cardiovascular:     Rate and Rhythm: Normal rate and regular rhythm.     Heart sounds: Normal heart sounds.  Pulmonary:     Effort: Pulmonary effort is normal. No respiratory distress.     Breath sounds: No wheezing.  Abdominal:     General: There is no distension.  Musculoskeletal:     Cervical back: Neck supple.  Skin:    General: Skin is warm.     Capillary Refill: Capillary refill takes less than 2 seconds.     Findings: No rash.     Comments: Superficial linear marks to right volar forearm, no deep lacerations.  Neurological:     Mental Status: She is alert and oriented to person, place, and time.     Motor: No abnormal muscle tone.       ____________________________________________   LABS (all labs ordered are listed, but only abnormal results are displayed)  Labs Reviewed  COMPREHENSIVE METABOLIC PANEL - Abnormal; Notable for the following components:      Result Value   CO2 19 (*)    All other components within normal limits  SALICYLATE LEVEL - Abnormal;  Notable for the following components:   Salicylate Lvl Q000111Q (*)    All other components within normal limits  ACETAMINOPHEN LEVEL - Abnormal; Notable for the following components:   Acetaminophen (Tylenol), Serum <10 (*)    All other components within normal limits  CBC - Abnormal; Notable for the following components:   WBC 11.9 (*)    All other components within normal limits  ETHANOL  URINE DRUG SCREEN, QUALITATIVE ([facility] ONLY)  POC URINE PREG, ED    ____________________________________________  EKG: None ________________________________________  RADIOLOGY All imaging, including plain films, CT scans, and ultrasounds, independently reviewed by me, and interpretations confirmed via formal radiology reads.  ED MD interpretation:   None  Official radiology report(s): No  results found.  ____________________________________________  PROCEDURES   Procedure(s) performed (including Critical Care):  Procedures  ____________________________________________  INITIAL IMPRESSION / MDM / [place] / ED COURSE  As part of my medical decision making, I reviewed the following data within the [place] notes reviewed and incorporated, Old chart reviewed, Notes from prior ED visits, and [place] Controlled Substance Database       *Sandra Marsh was evaluated in Emergency Department on 09/07/2019 for the symptoms described in the history of present illness. She was evaluated in the context of the global COVID-19 pandemic, which necessitated consideration that the patient might be at risk for infection with the SARS-CoV-2 virus that causes COVID-19. Institutional protocols and algorithms that pertain to the evaluation of patients at risk for COVID-19 are in a state of rapid change based on information released by regulatory bodies including the CDC and federal and state organizations. These policies and algorithms were followed during the patient's care in the  ED.  Some ED evaluations and interventions may be delayed as a result of limited staffing during the pandemic.*     Medical Decision Making:  24 yo F here with self harm and suicidal ideation. Voluntary. Labs overall reassuring, cuts are superficial and she is medically stable. Tetanus updated.  ____________________________________________  FINAL CLINICAL IMPRESSION(S) / ED DIAGNOSES  Final diagnoses:  Suicidal ideations  Deliberate self-cutting     MEDICATIONS GIVEN DURING THIS VISIT:  Medications  Tdap (BOOSTRIX) injection 0.5 mL (has no administration in time range)     ED Discharge Orders    None       Note:  This document was prepared using Dragon voice recognition software and may include unintentional dictation errors.   Duffy Bruce, MD 09/07/19 [phone]

## 2019-09-07 NOTE — ED Notes (Signed)
ED BHU  Is the patient under IVC or is there intent for IVC: voluntary Is the patient medically cleared: Yes.   Is there vacancy in the ED BHU: Yes.   Is the population mix appropriate for patient: Yes.   Is the patient awaiting placement in inpatient or outpatient setting:  Has the patient had a psychiatric consult:   Consult pending Survey of unit performed for contraband, proper placement and condition of furniture, tampering with fixtures in bathroom, shower, and each patient room: Yes.  ; Findings:  APPEARANCE/BEHAVIOR Calm and cooperative NEURO ASSESSMENT Orientation: oriented x3  Denies pain Hallucinations: No.None noted (Hallucinations)  Denies  Speech: Normal Gait: normal RESPIRATORY ASSESSMENT Even  Unlabored respirations  CARDIOVASCULAR ASSESSMENT Pulses equal   regular rate  Skin warm and dry   GASTROINTESTINAL ASSESSMENT no GI complaint EXTREMITIES Full ROM  PLAN OF CARE Provide calm/safe environment. Vital signs assessed twice daily. ED BHU Assessment once each 12-hour shift. Assure the ED provider has rounded once each shift. Provide and encourage hygiene. Provide redirection as needed. Assess for escalating behavior; address immediately and inform ED provider.  Assess family dynamic and appropriateness for visitation as needed: Yes.  ; If necessary, describe findings:  Educate the patient/family about BHU procedures/visitation: Yes.  ; If necessary, describe findings:
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
## 2019-09-07 NOTE — Progress Notes (Signed)
Patient refused her 1400 Celexa stating that she already took it this morning.

## 2019-09-07 NOTE — ED Notes (Signed)
BEHAVIORAL HEALTH ROUNDING Patient sleeping: No. Patient alert and oriented: yes Behavior appropriate: Yes.  ; If no, describe:  Nutrition and fluids offered: yes Toileting and hygiene offered: Yes  Sitter present: q15 minute observations and security camera monitoring   

## 2019-09-07 NOTE — Tx Team (Signed)
Initial Treatment Plan 09/07/2019 3:35 PM Sandra Marsh [id number]    PATIENT STRESSORS: Marital or family conflict Other: Work related issues   PATIENT STRENGTHS: Ability for insight Curator fund of knowledge Motivation for treatment/growth Work skills   PATIENT IDENTIFIED PROBLEMS: Passive SI  Self-injurious behaviors  Depression  Anxiety               DISCHARGE CRITERIA:  Ability to meet basic life and health needs Improved stabilization in mood, thinking, and/or behavior Need for constant or close observation no longer present Reduction of life-threatening or endangering symptoms to within safe limits  PRELIMINARY DISCHARGE PLAN: Outpatient therapy Return to previous living arrangement Return to previous work or school arrangements  PATIENT/FAMILY INVOLVEMENT: This treatment plan has been presented to and reviewed with the patient, Sandra Marsh. The patient has been given the opportunity to ask questions and make suggestions.  Ziyon Soltau, RN 09/07/2019, 3:35 PM

## 2019-09-07 NOTE — ED Notes (Signed)
Patient changed into hospital provided scrubs by this RN and Beth NT. Patient's belonging's placed into labeled bag.   Patient's belonging's include: Pair nike sandals, pair socks, pair jeans, pair panties, bra, red hoodie, pair stud earrings, brown purse, $27 cash - left in purse, 2 cell phones - left in purse, pair nipple piercing, belly piercing, nose piercing.  Patient kept glasses.
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
## 2019-09-07 NOTE — ED Triage Notes (Signed)
Patient c/o SI. Patient has superficial cuts to right wrist. Patient report plan to cut her wrists. Patient has hx of bipolar and borderline personality disorder. Patient reports current symptoms ongoing for 2 weeks. Patient reports she is taking her medications.

## 2019-09-07 NOTE — ED Notes (Signed)

## 2019-09-07 NOTE — BH Assessment (Signed)
Patient is to be admitted to [facility] by Dr. Claris Gower.  Attending Physician will be Dr. Mallie Darting.   Patient has been assigned to room 312, by [facility] Charge Nurse Demetria.   Intake Paper Work has been signed and placed on patient chart.   ER staff is aware of the admission:  Glenda, ER Secretary    Dr. Cherylann Banas, ER MD   Amy T., Patient's Nurse   Lenon Ahmadi, Patient Access.

## 2019-09-08 DIAGNOSIS — F332 Major depressive disorder, recurrent severe without psychotic features: Secondary | ICD-10-CM | POA: Diagnosis present

## 2019-09-08 LAB — TSH: TSH: 1.844 u[IU]/mL (ref 0.350–4.500)

## 2019-09-08 LAB — LIPID PANEL
Cholesterol: 239 mg/dL — ABNORMAL HIGH (ref 0–200)
HDL: 62 mg/dL (ref >40)
LDL Cholesterol: 150 mg/dL — ABNORMAL HIGH (ref 0–99)
Total CHOL/HDL Ratio: 3.9 RATIO
Triglycerides: 133 mg/dL (ref <150)
VLDL: 27 mg/dL (ref 0–40)

## 2019-09-08 LAB — HEPATIC FUNCTION PANEL
ALT: 18 U/L (ref 0–44)
AST: 29 U/L (ref 15–41)
Albumin: 3.5 g/dL (ref 3.5–5.0)
Alkaline Phosphatase: 59 U/L (ref 38–126)
Bilirubin, Direct: 0.2 mg/dL (ref 0.0–0.2)
Indirect Bilirubin: 0.6 mg/dL (ref 0.3–0.9)
Total Bilirubin: 0.8 mg/dL (ref 0.3–1.2)
Total Protein: 7.3 g/dL (ref 6.5–8.1)

## 2019-09-08 NOTE — BHH Group Notes (Signed)
LCSW Group Therapy Note  09/08/2019 2:43 PM  Type of Therapy/Topic:  Group Therapy:  Feelings about Diagnosis  Participation Level:  Active   Description of Group:   This group will allow patients to explore their thoughts and feelings about diagnoses they have received. Patients will be guided to explore their level of understanding and acceptance of these diagnoses. Facilitator will encourage patients to process their thoughts and feelings about the reactions of others to their diagnosis and will guide patients in identifying ways to discuss their diagnosis with significant others in their lives. This group will be process-oriented, with patients participating in exploration of their own experiences, giving and receiving support, and processing challenge from other group members.   Therapeutic Goals: 1. Patient will demonstrate understanding of diagnosis as evidenced by identifying two or more symptoms of the disorder 2. Patient will be able to express two feelings regarding the diagnosis 3. Patient will demonstrate their ability to communicate their needs through discussion and/or role play  Summary of Patient Progress: Patient was present in group.  Patient was an active participant.  Patient shared some frustration at having received multiple different diagnosis.  Patient was receptive as CSW explained why that may occur. Patient shared that she sings as a coping skill.    Therapeutic Modalities:   Cognitive Behavioral Therapy Brief Therapy Feelings Identification   Assunta Curtis, MSW, LCSW 09/08/2019 2:43 PM

## 2019-09-08 NOTE — Plan of Care (Signed)
  Problem: Education: Goal: Knowledge of [facility] Education information/materials will improve Outcome: Progressing Goal: Emotional status will improve Outcome: Progressing Goal: Mental status will improve Outcome: Progressing Goal: Verbalization of understanding the information provided will improve Outcome: Progressing  D: Patient has been calm and cooperative. Mood is pleasant. Affect brightens upon approach. Patient denies SI, HI and AVH. In the dayroom socializing with her peers and playing cards. Contracting for safety A: Continue to monitor for safety R: Safety maintained.

## 2019-09-08 NOTE — Plan of Care (Signed)
D- Patient alert and oriented. Patient presents in a pleasant mood on assessment stating that she "tossed and turned" last night, but had no complaints to voice to this Probation officer. Patient endorsed depression/anxiety, rating them both a "7/10" and "4/10". Patient stated that her depression is "better today", and her anxiety is "not as much as yesterday". Patient denies SI, HI, AVH, and pain at this time. Patient's goal for today is "consider more healthy coping skills, understand what my stressors are", in which she will "attend all groups and activities, reflect" in order to achieve her goal.  A- Scheduled medications administered to patient, per MD orders. Support and encouragement provided.  Routine safety checks conducted every 15 minutes.  Patient informed to notify staff with problems or concerns.  R- No adverse drug reactions noted. Patient contracts for safety at this time. Patient compliant with medications and treatment plan. Patient receptive, calm, and cooperative. Patient interacts well with others on the unit.  Patient remains safe at this time.  Problem: Education: Goal: Knowledge of [facility] Education information/materials will improve Outcome: Progressing Goal: Emotional status will improve Outcome: Progressing Goal: Mental status will improve Outcome: Progressing Goal: Verbalization of understanding the information provided will improve Outcome: Progressing   Problem: Safety: Goal: Periods of time without injury will increase Outcome: Progressing   Problem: Self-Concept: Goal: Ability to disclose and discuss suicidal ideas will improve Outcome: Progressing   Problem: Coping: Goal: Coping ability will improve Outcome: Progressing Goal: Will verbalize feelings Outcome: Progressing   Problem: Self-Concept: Goal: Level of anxiety will decrease Outcome: Progressing

## 2019-09-08 NOTE — Tx Team (Cosign Needed)
Interdisciplinary Treatment and Diagnostic Plan Update  09/08/2019 Time of Session: Dolphine Skalicky MRN: [id number]  Principal Diagnosis: <principal problem not specified>  Secondary Diagnoses: Active Problems:   MDD (major depressive disorder)   Current Medications:  Current Facility-Administered Medications  Medication Dose Route Frequency Provider Last Rate Last Admin  . acetaminophen (TYLENOL) tablet 650 mg  650 mg Oral Q6H PRN Cristofano, Dorene Ar, MD      . alum & mag hydroxide-simeth (MAALOX/MYLANTA) 200-200-20 MG/5ML suspension 30 mL  30 mL Oral Q4H PRN Cristofano, Paul A, MD      . buPROPion (WELLBUTRIN XL) 24 hr tablet 150 mg  150 mg Oral Daily Sharma Covert, MD   150 mg at 09/08/19 0818  . hydrOXYzine (ATARAX/VISTARIL) tablet 25 mg  25 mg Oral TID PRN Sharma Covert, MD      . hydrOXYzine (ATARAX/VISTARIL) tablet 50 mg  50 mg Oral QHS PRN Sharma Covert, MD      . lamoTRIgine (LAMICTAL) tablet 25 mg  25 mg Oral Daily Sharma Covert, MD   25 mg at 09/08/19 0818  . magnesium hydroxide (MILK OF MAGNESIA) suspension 30 mL  30 mL Oral Daily PRN Cristofano, Dorene Ar, MD       PTA Medications: Medications Prior to Admission  Medication Sig Dispense Refill Last Dose  . albuterol (PROVENTIL HFA;VENTOLIN HFA) 108 (90 Base) MCG/ACT inhaler Inhale 2 puffs into the lungs every 6 (six) hours as needed for wheezing or shortness of breath. 1 Inhaler 2   . benzonatate (TESSALON PERLES) 100 MG capsule Take 1 capsule (100 mg total) by mouth 3 (three) times daily as needed for cough. 30 capsule 0   . citalopram (CELEXA) 20 MG tablet Take 20 mg by mouth daily.     . citalopram (CELEXA) 20 MG tablet Take by mouth.     . fluticasone (FLONASE) 50 MCG/ACT nasal spray Place 2 sprays into both nostrils daily. 16 g 0   . HYDROcodone-acetaminophen (NORCO) 5-325 MG tablet Take 1 tablet by mouth every 4 (four) hours as needed for moderate pain. 4 tablet 0   . ibuprofen (ADVIL) 800 MG tablet  Take 800 mg by mouth every 8 (eight) hours as needed. for pain     . levonorgestrel (PLAN B 1-STEP) 1.5 MG tablet      . norethindrone-ethinyl estradiol (CYCLAFEM) 0.5/0.75/1-35 MG-MCG tablet Take 1 tablet by mouth daily.     . norethindrone-ethinyl estradiol (NORTREL 0.5/35, 28,) 0.5-35 MG-MCG tablet Take by mouth.     . oseltamivir (TAMIFLU) 75 MG capsule Take 1 capsule (75 mg total) by mouth 2 (two) times daily. 10 capsule 0   . PIRMELLA 7/7/7 0.5/0.75/1-35 MG-MCG tablet Take 1 tablet by mouth daily.     . traZODone (DESYREL) 100 MG tablet TAKE 1 TABLET BY MOUTH ONCE DAILY AFTER A MEAL       Patient Stressors: Marital or family conflict Other: Work related issues  Patient Strengths: Ability for Engineer, manufacturing fund of knowledge Motivation for treatment/growth Work skills  Treatment Modalities: Medication Management, Group therapy, Case management,  1 to 1 session with clinician, Psychoeducation, Recreational therapy.   Physician Treatment Plan for Primary Diagnosis: <principal problem not specified> Long Term Goal(s): Improvement in symptoms so as ready for discharge Improvement in symptoms so as ready for discharge   Short Term Goals: Ability to identify changes in lifestyle to reduce recurrence of condition will improve Ability to verbalize feelings will improve Ability to disclose and  discuss suicidal ideas Ability to demonstrate self-control will improve Ability to identify and develop effective coping behaviors will improve Ability to maintain clinical measurements within normal limits will improve Ability to identify changes in lifestyle to reduce recurrence of condition will improve Ability to verbalize feelings will improve Ability to disclose and discuss suicidal ideas Ability to demonstrate self-control will improve Ability to identify and develop effective coping behaviors will improve Ability to maintain clinical measurements within normal  limits will improve  Medication Management: Evaluate patient's response, side effects, and tolerance of medication regimen.  Therapeutic Interventions: 1 to 1 sessions, Unit Group sessions and Medication administration.  Evaluation of Outcomes: Progressing  Physician Treatment Plan for Secondary Diagnosis: Active Problems:   MDD (major depressive disorder)  Long Term Goal(s): Improvement in symptoms so as ready for discharge Improvement in symptoms so as ready for discharge   Short Term Goals: Ability to identify changes in lifestyle to reduce recurrence of condition will improve Ability to verbalize feelings will improve Ability to disclose and discuss suicidal ideas Ability to demonstrate self-control will improve Ability to identify and develop effective coping behaviors will improve Ability to maintain clinical measurements within normal limits will improve Ability to identify changes in lifestyle to reduce recurrence of condition will improve Ability to verbalize feelings will improve Ability to disclose and discuss suicidal ideas Ability to demonstrate self-control will improve Ability to identify and develop effective coping behaviors will improve Ability to maintain clinical measurements within normal limits will improve     Medication Management: Evaluate patient's response, side effects, and tolerance of medication regimen.  Therapeutic Interventions: 1 to 1 sessions, Unit Group sessions and Medication administration.  Evaluation of Outcomes: Progressing   RN Treatment Plan for Primary Diagnosis: <principal problem not specified> Long Term Goal(s): Knowledge of disease and therapeutic regimen to maintain health will improve  Short Term Goals: Ability to participate in decision making will improve, Ability to verbalize feelings will improve, Ability to disclose and discuss suicidal ideas, Ability to identify and develop effective coping behaviors will improve and  Compliance with prescribed medications will improve  Medication Management: RN will administer medications as ordered by provider, will assess and evaluate patient's response and provide education to patient for prescribed medication. RN will report any adverse and/or side effects to prescribing provider.  Therapeutic Interventions: 1 on 1 counseling sessions, Psychoeducation, Medication administration, Evaluate responses to treatment, Monitor vital signs and CBGs as ordered, Perform/monitor CIWA, COWS, AIMS and Fall Risk screenings as ordered, Perform wound care treatments as ordered.  Evaluation of Outcomes: Progressing   LCSW Treatment Plan for Primary Diagnosis: <principal problem not specified> Long Term Goal(s): Safe transition to appropriate next level of care at discharge, Engage patient in therapeutic group addressing interpersonal concerns.  Short Term Goals: Engage patient in aftercare planning with referrals and resources  Therapeutic Interventions: Assess for all discharge needs, 1 to 1 time with Social worker, Explore available resources and support systems, Assess for adequacy in community support network, Educate family and significant other(s) on suicide prevention, Complete Psychosocial Assessment, Interpersonal group therapy.  Evaluation of Outcomes: Progressing   Progress in Treatment: Attending groups: Yes. Participating in groups: Yes. Taking medication as prescribed: Yes. Toleration medication: Yes. Family/Significant other contact made: No, will contact:  when pt gives consent Patient understands diagnosis: Yes. Discussing patient identified problems/goals with staff: Yes. Medical problems stabilized or resolved: Yes. Denies suicidal/homicidal ideation: Yes. Issues/concerns per patient self-inventory: No. Other: NA  New problem(s) identified: No, Describe:  None  reported  New Short Term/Long Term Goal(s):Attend outpatient treatment, take medication as  prescribed, develop and implement healthy coping methods  Patient Goals:  "Develop coping mechanisms to deal with stress and build healthy relationships"  Discharge Plan or Barriers: patient will return home and follow up with outpatient treatment. Pt scheduled for discharge on 09/09/19  Reason for Continuation of Hospitalization: Medication stabilization  Estimated Length of Stay:1-3 days. Scheduled for d/c on 09/09/19  Attendees: Patient:Sandra Marsh 09/08/2019 10:44 AM  Physician: Myles Lipps 09/08/2019 10:44 AM  Nursing: Nat Math [place] 09/08/2019 10:44 AM  RN Care Manager: 09/08/2019 10:44 AM  Social Worker: Sanjuana Kava [facility] 09/08/2019 10:44 AM  Recreational Therapist: Roanna Epley 09/08/2019 10:44 AM  Other:  09/08/2019 10:44 AM  Other:  09/08/2019 10:44 AM  Other: 09/08/2019 10:44 AM    Scribe for Treatment Team: Yvette Rack, LCSW 09/08/2019 10:44 AM

## 2019-09-08 NOTE — Progress Notes (Signed)
D: Patient has been calm and cooperative. Mood is pleasant. Affect brightens upon approach. Patient denies SI, HI and AVH. In the dayroom socializing with her peers and playing cards. Contracting for safety A: Continue to monitor for safety R: Safety maintained.
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
## 2019-09-08 NOTE — BH Assessment (Addendum)
Assessment Note  Sandra Marsh is an 24 y.o. female who presents to ED stating "I had a plan to kill myself after work". Pt reports having a plan to overdose using her prescribed medications. Pt reports having overwhelming feelings "of not feeling good enough". Pt pointed to the superficial cuts on her right wrist. She reports self-medicating with marijuana daily -  smoking 1/2 a gram. 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 Pt was alert and oriented x4 with a pleasant mood. Pt denied HI/AVH.    Diagnosis: Major Depressive Disorder  Past Medical History:  Past Medical History:  Diagnosis Date  . Anxiety 07/09/2018  . MDD (major depressive disorder) 09/07/2019  . PCOS (polycystic ovarian syndrome)     History reviewed. No pertinent surgical history.  Family History:  Family History  Problem Relation Age of Onset  . Hypertension Paternal Grandmother     Social History:  reports that she has quit smoking. She has never used smokeless tobacco. She reports that she does not drink alcohol or use drugs.  Additional Social History:  Alcohol / Drug Use Pain Medications: See MAR Prescriptions: See MAR Over the Counter: See MAR History of alcohol / drug use?: Yes Longest period of sobriety (when/how long): UKN Negative Consequences of Use: Financial, Personal relationships Withdrawal Symptoms: (Denied) Substance #1 Name of Substance 1: THC 1 - Age of First Use: Unable to quantify 1 - Amount (size/oz): Unable to quantify 1 - Frequency: Daily 1 - Duration: Unable to quantify 1 - Last Use / Amount: Yesterday  CIWA: CIWA-Ar BP: 131/89 Pulse Rate: 95 COWS:    Allergies:  Allergies  Allergen Reactions  . Nickel Rash    Home Medications:  Medications Prior to Admission  Medication Sig Dispense Refill  . albuterol (PROVENTIL HFA;VENTOLIN HFA) 108 (90 Base) MCG/ACT inhaler Inhale 2 puffs into the lungs every 6 (six) hours as needed for wheezing or  shortness of breath. 1 Inhaler 2  . benzonatate (TESSALON PERLES) 100 MG capsule Take 1 capsule (100 mg total) by mouth 3 (three) times daily as needed for cough. 30 capsule 0  . citalopram (CELEXA) 20 MG tablet Take 20 mg by mouth daily.    . citalopram (CELEXA) 20 MG tablet Take by mouth.    . fluticasone (FLONASE) 50 MCG/ACT nasal spray Place 2 sprays into both nostrils daily. 16 g 0  . HYDROcodone-acetaminophen (NORCO) 5-325 MG tablet Take 1 tablet by mouth every 4 (four) hours as needed for moderate pain. 4 tablet 0  . ibuprofen (ADVIL) 800 MG tablet Take 800 mg by mouth every 8 (eight) hours as needed. for pain    . levonorgestrel (PLAN B 1-STEP) 1.5 MG tablet     . norethindrone-ethinyl estradiol (CYCLAFEM) 0.5/0.75/1-35 MG-MCG tablet Take 1 tablet by mouth daily.    . norethindrone-ethinyl estradiol (NORTREL 0.5/35, 28,) 0.5-35 MG-MCG tablet Take by mouth.    . oseltamivir (TAMIFLU) 75 MG capsule Take 1 capsule (75 mg total) by mouth 2 (two) times daily. 10 capsule 0  . PIRMELLA 7/7/7 0.5/0.75/1-35 MG-MCG tablet Take 1 tablet by mouth daily.    . traZODone (DESYREL) 100 MG tablet TAKE 1 TABLET BY MOUTH ONCE DAILY AFTER A MEAL      OB/GYN Status:  Patient's last menstrual period was 09/06/2019.  General Assessment Data Location of Assessment: [facility] ED TTS Assessment: In system Is this a Tele or Face-to-Face Assessment?: Face-to-Face Is this an Initial Assessment or a Re-assessment for this encounter?:  Initial Assessment Patient Accompanied by:: N/A Language Other than English: No Living Arrangements: Other (Comment)(Private Residence) What gender do you identify as?: Female Marital status: Single Maiden name: N/A Pregnancy Status: No Living Arrangements: Alone Can pt return to current living arrangement?: Yes Admission Status: Voluntary Is patient capable of signing voluntary admission?: Yes Referral Source: Self/Family/Friend Insurance type: Ship broker Exam  ([place]) Medical Exam completed: Yes  Crisis Care Plan Living Arrangements: Alone Legal Guardian: Other:(Self) Name of Psychiatrist: None Name of Therapist: None  Education Status Is patient currently in school?: No Highest grade of school patient has completed: Bachelors in SW Is the patient employed, unemployed or receiving disability?: 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 : Yes Suicidal Intent: No Has patient had any suicidal intent within the past 6 months prior to admission? : No Is patient at risk for suicide?: No Suicidal Plan?: Yes-Currently Present Has patient had any suicidal plan within the past 6 months prior to admission? : Yes Specify Current Suicidal Plan: Pt reports having a plan to overdose on medications Access to Means: Yes Specify Access to Suicidal Means: Medications What has been your use of drugs/alcohol within the last 12 months?: Marijuana: "1/2 grams" Previous Attempts/Gestures: No(Denied) How many times?Marland Kitchen Myer Haff) Other Self Harm Risks: Cutting Triggers for Past Attempts: Unknown Intentional Self Injurious Behavior: Cutting Comment - Self Injurious Behavior: cuts to wrist Family Suicide History: Unknown Recent stressful life event(s): Conflict (Comment), Financial Problems, Other (Comment)(Work stress) Persecutory voices/beliefs?: No Depression: Yes Depression Symptoms: Tearfulness, Feeling worthless/self pity, Feeling angry/irritable, Guilt Substance abuse history and/or treatment for substance abuse?: Yes Suicide prevention information given to non-admitted patients: Not applicable  Risk to Others within the past 6 months Homicidal Ideation: No Does patient have any lifetime risk of violence toward others beyond the six months prior to admission? : No Thoughts of Harm to Others: No Current  Homicidal Intent: No Current Homicidal Plan: No Access to Homicidal Means: No Identified Victim: None History of harm to others?: No Assessment of Violence: None Noted Violent Behavior Description: None Does patient have access to weapons?: No Criminal Charges Pending?: No Does patient have a court date: No Is patient on probation?: No  Psychosis Hallucinations: None noted Delusions: None noted  Mental Status Report Appearance/Hygiene: Unremarkable Eye Contact: Fair Motor Activity: Freedom of movement Speech: Logical/coherent Level of Consciousness: Alert Mood: Pleasant Affect: Appropriate to circumstance Anxiety Level: Minimal Thought Processes: Coherent, Relevant Judgement: Unimpaired Orientation: Person, Place, Time, Situation, Appropriate for developmental age Obsessive Compulsive Thoughts/Behaviors: None  Cognitive Functioning Concentration: Normal Memory: Recent Intact, Remote Intact Is patient IDD: No Insight: Poor Impulse Control: Poor Appetite: Fair Have you had any weight changes? : No Change Sleep: Decreased Total Hours of Sleep: 4 Vegetative Symptoms: None  ADLScreening [facility]) Patient's cognitive ability adequate to safely complete daily activities?: Yes Patient able to express need for assistance with ADLs?: Yes Independently performs ADLs?: Yes (appropriate for developmental age)  Prior Inpatient Therapy Prior Inpatient Therapy: No  Prior Outpatient Therapy Prior Outpatient Therapy: Yes Prior Therapy Dates: Unable to recall dates Prior Therapy Facilty/Provider(s): [facility]) Reason for Treatment: Depression; Individual therapy; Medication management Does patient have an ACCT team?: No Does patient have Intensive In-House Services?  : No Does patient have [facility] services? : No Does patient have P4CC services?: No  ADL Screening (  condition at time of admission) Patient's cognitive ability adequate to safely  complete daily activities?: Yes Is the patient deaf or have difficulty hearing?: No Does the patient have difficulty seeing, even when wearing glasses/contacts?: No Does the patient have difficulty concentrating, remembering, or making decisions?: No Patient able to express need for assistance with ADLs?: Yes Does the patient have difficulty dressing or bathing?: No Independently performs ADLs?: Yes (appropriate for developmental age) Does the patient have difficulty walking or climbing stairs?: No Weakness of Legs: None Weakness of Arms/Hands: None  Home Assistive Devices/Equipment Home Assistive Devices/Equipment: None  Therapy Consults (therapy consults require a physician order) PT Evaluation Needed: No OT Evalulation Needed: No SLP Evaluation Needed: No Abuse/Neglect Assessment (Assessment to be complete while patient is alone) Abuse/Neglect Assessment Can Be Completed: Yes Physical Abuse: Denies Verbal Abuse: Denies Sexual Abuse: Denies Exploitation of patient/patient's resources: Denies Self-Neglect: Denies Values / Beliefs Cultural Requests During Hospitalization: None Spiritual Requests During Hospitalization: None Consults Spiritual Care Consult Needed: No Transition of Care Team Consult Needed: No Advance Directives (For Healthcare) Does Patient Have a Medical Advance Directive?: No Would patient like information on creating a medical advance directive?: No - Patient declined Nutrition Screen- [facility] Adult/[facility]/AP Patient's home diet: Regular Has the patient recently lost weight without trying?: No Has the patient been eating poorly because of a decreased appetite?: No Malnutrition Screening Tool Score: 0     Child/Adolescent Assessment Running Away Risk: (Patient is an adult)  Disposition:  Disposition Initial Assessment Completed for this Encounter: Yes Disposition of Patient: Admit Type of inpatient treatment program: Adult Patient refused recommended  treatment: No Mode of transportation if patient is discharged/movement?: N/A Patient referred to: Other (Comment)([facility])  On Site Evaluation by:   Reviewed with Physician:    Allyne Gee 09/08/2019 6:52 PM

## 2019-09-08 NOTE — Plan of Care (Signed)
Patient has been in the milieu, cooperative and attending group activities. Pleasant on approach. Denied SI/HI. Denied hallucinations. Visited with her aunt and said that she is supportive. No sign of distress. Encouragements and support provided. Safety monitored as recommended.

## 2019-09-08 NOTE — [facility] Suicide Risk Assessment (Signed)
[place] INPATIENT:  Family/Significant Other Suicide Prevention Education  Suicide Prevention Education:  Education Completed; Sandra Marsh, aunt, [phone] has been identified by the patient as the family member/significant other with whom the patient will be residing, and identified as the person(s) who will aid the patient in the event of a mental health crisis (suicidal ideations/suicide attempt).  With written consent from the patient, the family member/significant other has been provided the following suicide prevention education, prior to the and/or following the discharge of the patient.  The suicide prevention education provided includes the following:  Suicide risk factors  Suicide prevention and interventions  National Suicide Hotline telephone number  [facility] assessment telephone number  [facility] Emergency Assistance [place] and/or Residential Mobile Crisis Unit telephone number  Request made of family/significant other to:  Remove weapons (e.g., guns, rifles, knives), all items previously/currently identified as safety concern.    Remove drugs/medications (over-the-counter, prescriptions, illicit drugs), all items previously/currently identified as a safety concern.  The family member/significant other verbalizes understanding of the suicide prevention education information provided.  The family member/significant other agrees to remove the items of safety concern listed above.  Pts aunt reports "I'm a little confused, she keeps a lot of stuff in.  She just found out who her father is after all these years but he just disappeared all over again.  I know she is stressed about work. She acts like it doesn't bother her but I know it does.  She didn't grow up with a support group."  Aunt further reports that "it was just a lot from the time that she was little on up.  She was doing good and put herself through college."  Aunt denies concerns pt  may harm self or others.    Rozann Lesches 09/08/2019, 2:52 PM

## 2019-09-08 NOTE — Progress Notes (Signed)
Recreation Therapy Notes  Date: 09/08/2019  Time: 9:30 am  Location: Craft room  Behavioral response: Appropriate  Intervention Topic: Problem Solving    Discussion/Intervention:  Group content on today was focused on problem solving. The group described what problem solving is. Patients expressed how problems affect them and how they deal with problems. Individuals identified healthy ways to deal with problems. Patients explained what normally happens to them when they do not deal with problems. The group expressed reoccurring problems for them. The group participated in the intervention "Ways to Solve problems" where patients were given a chance to explore different ways to solve problems.  Clinical Observations/Feedback:  Patient came to group late due to unknown reasons. Individual participated in the intervention during group and was social with peers and staff. Jessy Cybulski LRT/CTRS         Alexxander Kurt 09/08/2019 12:03 PM

## 2019-09-08 NOTE — Progress Notes (Signed)
[facility] MD Progress Note  09/08/2019 2:42 PM Sandra Marsh  MRN:  [id number]   Subjective: Follow-up for this patient diagnosed with MDD severe recurrent.  Patient reports that she feels as though things are improving for her.  She states that she does still have some concerns about her stressors at home.  She also reports that she did not sleep very well last night and felt as though her sleep was disrupted and she had multiple times of waking up but was able to go back to sleep.  She denies having any suicidal or homicidal ideations and denies any hallucinations today.  Patient reports that she has been thinking about ways to ease some of the stress at work and she states that she works with an Agricultural consultant and that they will assist her greatly.  She also reports that she has a neighbor that is very supportive and comes over and speaks with her frequently.  She states that she is not very comfortable with discussing these things with her mom because her mother tends to be "eccentric."  Patient also reports that she is familiar with the medication she is on and she understands that it can take some time for the medications to be increased to the level as they need to be.  Principal Problem: MDD (major depressive disorder), recurrent severe, without psychosis ([place]) Diagnosis: Principal Problem:   MDD (major depressive disorder), recurrent severe, without psychosis ([place])  Total Time spent with patient: 20 minutes  Past Psychiatric History: Patient has been in psychiatric treatment or psychotherapy since she was a child.  She is only been treated with Lamictal and citalopram from medication management.  She has not had therapy since she was in college.  Past Medical History:  Past Medical History:  Diagnosis Date  . Anxiety 07/09/2018  . MDD (major depressive disorder) 09/07/2019  . PCOS (polycystic ovarian syndrome)    History reviewed. No pertinent surgical history. Family History:  Family History   Problem Relation Age of Onset  . Hypertension Paternal Grandmother    Family Psychiatric  History: Patient stated she felt like her family had psychiatric issues, but nothing has been formally diagnosed or treated Social History:  Social History   Substance and Sexual Activity  Alcohol Use Never     Social History   Substance and Sexual Activity  Drug Use Never    Social History   Socioeconomic History  . Marital status: Single    Spouse name: Not on file  . Number of children: Not on file  . Years of education: Not on file  . Highest education level: Not on file  Occupational History  . Not on file  Tobacco Use  . Smoking status: Former Research scientist (life sciences)  . Smokeless tobacco: Never Used  Substance and Sexual Activity  . Alcohol use: Never  . Drug use: Never  . Sexual activity: Yes  Other Topics Concern  . Not on file  Social History Narrative  . Not on file   Social Determinants of Health   Financial Resource Strain:   . Difficulty of Paying Living Expenses: Not on file  Food Insecurity:   . Worried About Charity fundraiser in the Last Year: Not on file  . Ran Out of Food in the Last Year: Not on file  Transportation Needs:   . Lack of Transportation (Medical): Not on file  . Lack of Transportation (Non-Medical): Not on file  Physical Activity:   . Days of Exercise per Week:  Not on file  . Minutes of Exercise per Session: Not on file  Stress:   . Feeling of Stress : Not on file  Social Connections:   . Frequency of Communication with Friends and Family: Not on file  . Frequency of Social Gatherings with Friends and Family: Not on file  . Attends Religious Services: Not on file  . Active Member of Clubs or Organizations: Not on file  . Attends Archivist Meetings: Not on file  . Marital Status: Not on file   Additional Social History:    Pain Medications: See MAR Prescriptions: See MAR Over the Counter: See MAR History of alcohol / drug use?:  Yes Longest period of sobriety (when/how long): UKN Negative Consequences of Use: Financial, Personal relationships Withdrawal Symptoms: (Denied) Name of Substance 1: THC 1 - Age of First Use: Unable to quantify 1 - Amount (size/oz): Unable to quantify 1 - Frequency: Daily 1 - Duration: Unable to quantify 1 - Last Use / Amount: Yesterday                  Sleep: Fair  Appetite:  Fair  Current Medications: Current Facility-Administered Medications  Medication Dose Route Frequency Provider Last Rate Last Admin  . acetaminophen (TYLENOL) tablet 650 mg  650 mg Oral Q6H PRN Cristofano, Dorene Ar, MD      . alum & mag hydroxide-simeth (MAALOX/MYLANTA) 200-200-20 MG/5ML suspension 30 mL  30 mL Oral Q4H PRN Cristofano, Paul A, MD      . buPROPion (WELLBUTRIN XL) 24 hr tablet 150 mg  150 mg Oral Daily Sharma Covert, MD   150 mg at 09/08/19 0818  . hydrOXYzine (ATARAX/VISTARIL) tablet 25 mg  25 mg Oral TID PRN Sharma Covert, MD      . hydrOXYzine (ATARAX/VISTARIL) tablet 50 mg  50 mg Oral QHS PRN Sharma Covert, MD      . lamoTRIgine (LAMICTAL) tablet 25 mg  25 mg Oral Daily Sharma Covert, MD   25 mg at 09/08/19 0818  . magnesium hydroxide (MILK OF MAGNESIA) suspension 30 mL  30 mL Oral Daily PRN Cristofano, Dorene Ar, MD        Lab Results:  Results for orders placed or performed during the hospital encounter of 09/07/19 (from the past 48 hour(s))  TSH     Status: None   Collection Time: 09/08/19  6:34 AM  Result Value Ref Range   TSH 1.844 0.350 - 4.500 uIU/mL    Comment: Performed by a 3rd Generation assay with a functional sensitivity of <=0.01 uIU/mL. Performed at [facility], [address] [place], [place] [address]   Lipid panel     Status: Abnormal   Collection Time: 09/08/19  6:34 AM  Result Value Ref Range   Cholesterol 239 (H) 0 - 200 mg/dL   Triglycerides 133 <150 mg/dL   HDL 62 >40 mg/dL   Total CHOL/HDL Ratio 3.9 RATIO   VLDL 27 0 - 40  mg/dL   LDL Cholesterol 150 (H) 0 - 99 mg/dL    Comment:        Total Cholesterol/HDL:CHD Risk Coronary Heart Disease Risk Table                     Men   Women  1/2 Average Risk   3.4   3.3  Average Risk       5.0   4.4  2 X Average Risk   9.6  7.1  3 X Average Risk  23.4   11.0        Use the calculated Patient Ratio above and the CHD Risk Table to determine the patient's CHD Risk.        ATP III CLASSIFICATION (LDL):  <100     mg/dL   Optimal  100-129  mg/dL   Near or Above                    Optimal  130-159  mg/dL   Borderline  160-189  mg/dL   High  >190     mg/dL   Very High Performed at [facility], [address] [place], [place] [address]   Hepatic function panel     Status: None   Collection Time: 09/08/19  6:34 AM  Result Value Ref Range   Total Protein 7.3 6.5 - 8.1 g/dL   Albumin 3.5 3.5 - 5.0 g/dL   AST 29 15 - 41 U/L   ALT 18 0 - 44 U/L   Alkaline Phosphatase 59 38 - 126 U/L   Total Bilirubin 0.8 0.3 - 1.2 mg/dL   Bilirubin, Direct 0.2 0.0 - 0.2 mg/dL   Indirect Bilirubin 0.6 0.3 - 0.9 mg/dL    Comment: Performed at [facility], [address] [place], [place] [address]    Blood Alcohol level:  Lab Results  Component Value Date   [place] [facility] <10 [id number]    Metabolic Disorder Labs: No results found for: HGBA1C, MPG No results found for: PROLACTIN Lab Results  Component Value Date   CHOL 239 (H) 09/08/2019   TRIG 133 09/08/2019   HDL 62 09/08/2019   CHOLHDL 3.9 09/08/2019   VLDL 27 09/08/2019   LDLCALC 150 (H) 09/08/2019    Physical Findings: AIMS:  , ,  ,  ,    CIWA:    COWS:     Musculoskeletal: Strength & Muscle Tone: within normal limits Gait & Station: normal Patient leans: N/A  Psychiatric Specialty Exam: Physical Exam  Nursing note and vitals reviewed. Constitutional: She is oriented to person, place, and time. She appears well-developed and well-nourished.  Cardiovascular: Normal rate.  Respiratory:  Effort normal.  Musculoskeletal:        General: Normal range of motion.  Neurological: She is alert and oriented to person, place, and time.  Skin: Skin is warm.    Review of Systems  Constitutional: Negative.   HENT: Negative.   Eyes: Negative.   Respiratory: Negative.   Cardiovascular: Negative.   Gastrointestinal: Negative.   Genitourinary: Negative.   Musculoskeletal: Negative.   Skin: Negative.   Neurological: Negative.   Psychiatric/Behavioral: Negative.     Blood pressure 131/89, pulse 95, temperature 98.4 F (36.9 C), temperature source Oral, resp. rate 18, height 5' (1.524 m), weight 94.3 kg, last menstrual period 09/06/2019, SpO2 100 %.Body mass index is 40.62 kg/m.  General Appearance: Casual  Eye Contact:  Good  Speech:  Clear and Coherent and Normal Rate  Volume:  Normal  Mood:  Euthymic  Affect:  Congruent  Thought Process:  Coherent and Descriptions of Associations: Intact  Orientation:  Full (Time, Place, and Person)  Thought Content:  WDL  Suicidal Thoughts:  No  Homicidal Thoughts:  No  Memory:  Immediate;   Good Recent;   Good Remote;   Good  Judgement:  Fair  Insight:  Fair  Psychomotor Activity:  Normal  Concentration:  Concentration: Fair  Recall:  [employer]  of Knowledge:  Good  Language:  Good  Akathisia:  No  Handed:  Right  AIMS (if indicated):     Assets:  Communication Skills Desire for Improvement Financial Resources/Insurance Housing Resilience Social Support Transportation  ADL's:  Intact  Cognition:  WNL  Sleep:      Assessment: Patient presents in the day room and is interacting with peers and staff appropriately.  Patient is pleasant, calm, and cooperative.  Patient has a logical conversation and has good eye contact and congruent affect today.  Patient does appear to be improving with having some time to think about the issue she is having to deal with at work.  She understands the titration of her Lamictal at this time and  the possibility of increasing her Wellbutrin to 300 mg within the next few days if needed.  After reviewing the patient's chart found that the patient was not supposed to be continued on the trazodone as that is the medication she had considered overdosing on, but it was still on her medication list.  Trazodone was discontinued at this time.  Patient stated that she would try sleeping on just the Vistaril at bedtime.  We discussed other options and patient stated that she was interested in possibly using Remeron if needed and she would attempt to manage her diet to prevent the weight gain.  Patient did report that she has used melatonin in the past and she is to alternate between melatonin and trazodone.  She states that the melatonin but only worked for a few weeks and she has used doses as high as 40 to 50 mg of melatonin to try to get some sleep.  Patient was informed that that was not a safe dose for her to be taking and encouraged not to do that again.  Patient also stated that she is requesting a note for work when she discharges and is asking for it to write her out of work for the rest the week allowed her to return to work next Monday.  Treatment Plan Summary: Daily contact with patient to assess and evaluate symptoms and progress in treatment and Medication management Continue Wellbutrin XL 150 mg p.o. daily for depression Continue Lamictal 25 mg p.o. daily for mood stability Continue Vistaril 50 mg p.o. nightly for insomnia Continue Vistaril 25 mg 3 times daily as needed for anxiety Discontinue trazodone Encourage group therapy participation Continue every 15 minute safety checks Potential discharge within the next 1 to 2 days.  Callahan, FNP 09/08/2019, 2:42 PM

## 2019-09-09 MED ORDER — BUPROPION HCL ER (XL) 150 MG PO TB24: 150.0000 mg | ORAL_TABLET | Freq: Every day | ORAL | 1 refills | Status: DC

## 2019-09-09 MED ORDER — HYDROXYZINE HCL 25 MG PO TABS: 25.0000 mg | ORAL_TABLET | Freq: Three times a day (TID) | ORAL | 1 refills | Status: DC | PRN

## 2019-09-09 MED ORDER — HYDROXYZINE HCL 50 MG PO TABS: 50.0000 mg | ORAL_TABLET | Freq: Every evening | ORAL | 1 refills | Status: DC | PRN

## 2019-09-09 MED ORDER — LAMOTRIGINE 25 MG PO TABS: 25.0000 mg | ORAL_TABLET | Freq: Every day | ORAL | 1 refills | Status: DC

## 2019-09-09 NOTE — Progress Notes (Signed)
D: Patient is aware of  Discharge this shift .  A:Patient denies suicidal /homicidal ideations. Patient received all belongings brought in  Received Storage medications. Writer reviewed Discharge Summary, Suicide Risk Assessment, and Transitional Record. Patient also received Prescriptions   from  MD.  Aware  Of follow up appointment .  R: Patient left unit with no questions  Or concern left with her  car

## 2019-09-09 NOTE — Progress Notes (Signed)
Recreation Therapy Notes  Date: 09/09/2019  Time: 9:30 am  Location: Craft room  Behavioral response: Appropriate  Intervention Topic: Stress Management   Discussion/Intervention:  Group content on today was focused on stress. The group defined stress and way to cope with stress. Participants expressed how they know when they are stresses out. Individuals described the different ways they have to cope with stress. The group stated reasons why it is important to cope with stress. Patient explained what good stress is and some examples. The group participated in the intervention "Stress Management". Individuals were able to answer questions related to stress.  Clinical Observations/Feedback:  Patient came to group and defined stress as things that cause you to feel anxious and irritable. She expressed that she likes to take walks in nature to deal with her stress. Participant explained that dealing with stress is important, so you do not end up in a hospital like this. Individual participated in the intervention during group and was social with peers and staff. Braylon Grenda LRT/CTRS         Redonna Wilbert 09/09/2019 11:27 AM

## 2019-09-09 NOTE — Progress Notes (Signed)
  [facility] Adult Case Management Discharge Plan :  Will you be returning to the same living situation after discharge:  Yes,  pt is returning to her home. At discharge, do you have transportation home?: Yes,  pt's car is on campus. Do you have the ability to pay for your medications: Yes,  [employer]  Release of information consent forms completed and in the chart;  Patient's signature needed at discharge.  Patient to Follow up at: Follow-up Information    [facility] Follow up.   Specialty: Behavioral Health Why: Visits are virtual due to Covid restrictions.  Questions/concerns prior to appt call [phone], M-T 8:30-5p, F 8:30-12p 09/10/19 w/LCSW, Maye Hides -[place] at 8 AM  [phone] 09/14/19 w/Dr. Cherlynn Perches at 9 AM [phone]  Contact information: [place] [phone]          Next level of care provider has access to [place] and Suicide Prevention discussed: Yes,  SPE completed with her aunt.   Have you used any form of tobacco in the last 30 days? (Cigarettes, Smokeless Tobacco, Cigars, and/or Pipes): No  Has patient been referred to the Quitline?: N/A patient is not a smoker  Patient has been referred for addiction treatment: Pt. refused referral  Rozann Lesches, LCSW 09/09/2019, 10:04 AM

## 2019-09-09 NOTE — BHH Suicide Risk Assessment (Signed)
[facility] Discharge Suicide Risk Assessment   Principal Problem: MDD (major depressive disorder), recurrent severe, without psychosis ([place]) Discharge Diagnoses: Principal Problem:   MDD (major depressive disorder), recurrent severe, without psychosis ([place])   Total Time spent with patient: 20 minutes  Musculoskeletal: Strength & Muscle Tone: within normal limits Gait & Station: normal Patient leans: N/A  Psychiatric Specialty Exam: Review of Systems  All other systems reviewed and are negative.   Blood pressure 136/89, pulse (!) 102, temperature 98.2 F (36.8 C), temperature source Oral, resp. rate 18, height 5' (1.524 m), weight 94.3 kg, last menstrual period 09/06/2019, SpO2 100 %.Body mass index is 40.62 kg/m.  General Appearance: Casual  Eye Contact::  Good  Speech:  Normal Rate409  Volume:  Normal  Mood:  Euthymic  Affect:  Congruent  Thought Process:  Coherent and Descriptions of Associations: Intact  Orientation:  Full (Time, Place, and Person)  Thought Content:  Logical  Suicidal Thoughts:  No  Homicidal Thoughts:  No  Memory:  Immediate;   Good Recent;   Good Remote;   Good  Judgement:  Intact  Insight:  Good  Psychomotor Activity:  Normal  Concentration:  Good  Recall:  Good  Fund of Knowledge:Good  Language: Good  Akathisia:  Negative  Handed:  Right  AIMS (if indicated):     Assets:  Communication Skills Desire for Improvement Financial Resources/Insurance Housing Resilience Social Support Talents/Skills Transportation Vocational/Educational  Sleep:  Number of Hours: 7  Cognition: WNL  ADL's:  Intact   Mental Status Per Nursing Assessment::   On Admission:  Suicidal ideation indicated by patient  Demographic Factors:  Caucasian and Living alone  Loss Factors: NA  Historical Factors: Impulsivity  Risk Reduction Factors:   Employed and Positive coping skills or problem solving skills  Continued Clinical Symptoms:  Depression:    Impulsivity  Cognitive Features That Contribute To Risk:  None    Suicide Risk:  Minimal: No identifiable suicidal ideation.  Patients presenting with no risk factors but with morbid ruminations; may be classified as minimal risk based on the severity of the depressive symptoms  Follow-up Information    [facility] Follow up.   Specialty: Behavioral Health Why: Visits are virtual due to Covid restrictions.  Questions/concerns prior to appt call [phone], M-T 8:30-5p, F 8:30-12p 09/10/19 w/LCSW, Maye Hides -[place] at 8 AM  [phone] 09/14/19 w/Dr. Cherlynn Perches at 9 AM [phone]  Contact information: [place] [phone]          Plan Of Care/Follow-up recommendations:  Activity:  ad lib  Sharma Covert, MD 09/09/2019, 10:10 AM

## 2019-09-09 NOTE — Plan of Care (Signed)
  Problem: Education: Goal: Knowledge of [place] [facility] Education information/materials will improve Outcome: Adequate for Discharge Goal: Emotional status will improve Outcome: Adequate for Discharge Goal: Mental status will improve Outcome: Adequate for Discharge Goal: Verbalization of understanding the information provided will improve Outcome: Adequate for Discharge   Problem: Safety: Goal: Periods of time without injury will increase Outcome: Adequate for Discharge   Problem: Self-Concept: Goal: Ability to disclose and discuss suicidal ideas will improve Outcome: Adequate for Discharge   Problem: Coping: Goal: Coping ability will improve Outcome: Adequate for Discharge Goal: Will verbalize feelings Outcome: Adequate for Discharge   Problem: Self-Concept: Goal: Level of anxiety will decrease Outcome: Adequate for Discharge

## 2019-09-09 NOTE — Discharge Summary (Signed)
Physician Discharge Summary Note  Patient:  Sandra Marsh is an 24 y.o., female MRN:  [id number] DOB:  11-12-1995 Patient phone:  [phone] (home)  Patient address:   [address], [place] [address],  Total Time spent with patient: 30 minutes  Date of Admission:  09/07/2019 Date of Discharge: 09/09/19  Reason for Admission:  24 year old female with a past psychiatric history significant for probable major depression as well as posttraumatic stress disorder who presented to the [facility] emergency department on 09/07/2019 with suicidal ideation.  Principal Problem: MDD (major depressive disorder), recurrent severe, without psychosis ([place]) Discharge Diagnoses: Principal Problem:   MDD (major depressive disorder), recurrent severe, without psychosis ([place])   Past Psychiatric History: Patient has been in psychiatric treatment or psychotherapy since she was a child.  She is only been treated with Lamictal and citalopram from medication management.  She has not had therapy since she was in college  Past Medical History:  Past Medical History:  Diagnosis Date  . Anxiety 07/09/2018  . MDD (major depressive disorder) 09/07/2019  . PCOS (polycystic ovarian syndrome)    History reviewed. No pertinent surgical history. Family History:  Family History  Problem Relation Age of Onset  . Hypertension Paternal Grandmother    Family Psychiatric  History: Patient stated she felt like her family had psychiatric issues, but nothing has been formally diagnosed or treated Social History:  Social History   Substance and Sexual Activity  Alcohol Use Never     Social History   Substance and Sexual Activity  Drug Use Never    Social History   Socioeconomic History  . Marital status: Single    Spouse name: Not on file  . Number of children: Not on file  . Years of education: Not on file  . Highest education level: Not on file  Occupational History  . Not on  file  Tobacco Use  . Smoking status: Former Research scientist (life sciences)  . Smokeless tobacco: Never Used  Substance and Sexual Activity  . Alcohol use: Never  . Drug use: Never  . Sexual activity: Yes  Other Topics Concern  . Not on file  Social History Narrative  . Not on file   Social Determinants of Health   Financial Resource Strain:   . Difficulty of Paying Living Expenses: Not on file  Food Insecurity:   . Worried About Charity fundraiser in the Last Year: Not on file  . Ran Out of Food in the Last Year: Not on file  Transportation Needs:   . Lack of Transportation (Medical): Not on file  . Lack of Transportation (Non-Medical): Not on file  Physical Activity:   . Days of Exercise per Week: Not on file  . Minutes of Exercise per Session: Not on file  Stress:   . Feeling of Stress : Not on file  Social Connections:   . Frequency of Communication with Friends and Family: Not on file  . Frequency of Social Gatherings with Friends and Family: Not on file  . Attends Religious Services: Not on file  . Active Member of Clubs or Organizations: Not on file  . Attends Archivist Meetings: Not on file  . Marital Status: Not on file    Hospital Course:  Patient remained on the [facility] unit for 2 days. The patient stabilized on medication and therapy. Patient was discharged on Wellbutrin XL 150 mg p.o. daily, Vistaril 25 mg p.o. 3 times daily as needed for anxiety,  Vistaril 50 mg p.o. nightly as needed for insomnia, and Lamictal 25 mg p.o. daily. Patient has shown improvement with improved mood, affect, sleep, appetite, and interaction. Patient has attended group and participated. Patient has been seen in the day room interacting with peers and staff appropriately. Patient denies any SI/HI/AVH and contracts for safety. Patient agrees to follow up at [facility]. Patient is provided with prescriptions for their medications upon discharge.  Patient should continue follow-up  with her outpatient provider for titration of the Wellbutrin XL as well as the Lamictal to maintain mood stability.   Physical Findings: AIMS:  , ,  ,  ,    CIWA:    COWS:     Musculoskeletal: Strength & Muscle Tone: within normal limits Gait & Station: normal Patient leans: N/A  Psychiatric Specialty Exam: Physical Exam  Nursing note and vitals reviewed. Constitutional: She is oriented to person, place, and time. She appears well-developed and well-nourished.  Cardiovascular: Normal rate.  Respiratory: Effort normal.  Musculoskeletal:        General: Normal range of motion.  Neurological: She is alert and oriented to person, place, and time.  Skin: Skin is warm.    Review of Systems  Constitutional: Negative.   HENT: Negative.   Eyes: Negative.   Respiratory: Negative.   Cardiovascular: Negative.   Gastrointestinal: Negative.   Genitourinary: Negative.   Musculoskeletal: Negative.   Skin: Negative.   Neurological: Negative.   Psychiatric/Behavioral: Negative.     Blood pressure 136/89, pulse (!) 102, temperature 98.2 F (36.8 C), temperature source Oral, resp. rate 18, height 5' (1.524 m), weight 94.3 kg, last menstrual period 09/06/2019, SpO2 100 %.Body mass index is 40.62 kg/m.  General Appearance: Casual  Eye Contact:  Good  Speech:  Clear and Coherent and Normal Rate  Volume:  Normal  Mood:  Euthymic  Affect:  Congruent  Thought Process:  Coherent and Descriptions of Associations: Intact  Orientation:  Full (Time, Place, and Person)  Thought Content:  WDL  Suicidal Thoughts:  No  Homicidal Thoughts:  No  Memory:  Immediate;   Good Recent;   Good Remote;   Good  Judgement:  Good  Insight:  Good  Psychomotor Activity:  Normal  Concentration:  Concentration: Good  Recall:  Good  Fund of Knowledge:  Good  Language:  Good  Akathisia:  No  Handed:  Right  AIMS (if indicated):     Assets:  Communication Skills Desire for Improvement Financial  Resources/Insurance Housing Resilience Social Support Talents/Skills  ADL's:  Intact  Cognition:  WNL  Sleep:  Number of Hours: 7     Have you used any form of tobacco in the last 30 days? (Cigarettes, Smokeless Tobacco, Cigars, and/or Pipes): No  Has this patient used any form of tobacco in the last 30 days? (Cigarettes, Smokeless Tobacco, Cigars, and/or Pipes) Yes, No  Blood Alcohol level:  Lab Results  Component Value Date   ETH <10 [id number]    Metabolic Disorder Labs:  No results found for: HGBA1C, MPG No results found for: PROLACTIN Lab Results  Component Value Date   CHOL 239 (H) 09/08/2019   TRIG 133 09/08/2019   HDL 62 09/08/2019   CHOLHDL 3.9 09/08/2019   VLDL 27 09/08/2019   LDLCALC 150 (H) 09/08/2019    See Psychiatric Specialty Exam and Suicide Risk Assessment completed by Attending Physician prior to discharge.  Discharge destination:  Home  Is patient on multiple antipsychotic therapies at discharge:  No  Has Patient had three or more failed trials of antipsychotic monotherapy by history:  No  Recommended Plan for Multiple Antipsychotic Therapies: NA  Discharge Instructions    Diet - low sodium heart healthy   Complete by: As directed    Increase activity slowly   Complete by: As directed      Allergies as of 09/09/2019      Reactions   Nickel Rash      Medication List    STOP taking these medications   benzonatate 100 MG capsule Commonly known as: Tessalon Perles   CeleXA 20 MG tablet Generic drug: citalopram   citalopram 20 MG tablet Commonly known as: CELEXA   fluticasone 50 MCG/ACT nasal spray Commonly known as: Flonase   HYDROcodone-acetaminophen 5-325 MG tablet Commonly known as: Norco   ibuprofen 800 MG tablet Commonly known as: ADVIL   levonorgestrel 1.5 MG tablet Commonly known as: PLAN B 1-STEP   norethindrone-ethinyl estradiol 0.5/0.75/1-35 MG-MCG tablet Commonly known as: CYCLAFEM   Nortrel 0.5/35 (28) 0.5-35  MG-MCG tablet Generic drug: norethindrone-ethinyl estradiol   oseltamivir 75 MG capsule Commonly known as: Tamiflu   Pirmella 7/7/7 0.5/0.75/1-35 MG-MCG tablet Generic drug: norethindrone-ethinyl estradiol   traZODone 100 MG tablet Commonly known as: DESYREL     TAKE these medications     Indication  albuterol 108 (90 Base) MCG/ACT inhaler Commonly known as: VENTOLIN HFA Inhale 2 puffs into the lungs every 6 (six) hours as needed for wheezing or shortness of breath.  Indication: Asthma   buPROPion 150 MG 24 hr tablet Commonly known as: WELLBUTRIN XL Take 1 tablet (150 mg total) by mouth daily. Start taking on: September 10, 2019  Indication: Major Depressive Disorder   hydrOXYzine 50 MG tablet Commonly known as: ATARAX/VISTARIL Take 1 tablet (50 mg total) by mouth at bedtime as needed (insomnia).  Indication: sleep   hydrOXYzine 25 MG tablet Commonly known as: ATARAX/VISTARIL Take 1 tablet (25 mg total) by mouth 3 (three) times daily as needed for itching or anxiety.  Indication: Feeling Anxious   lamoTRIgine 25 MG tablet Commonly known as: LAMICTAL Take 1 tablet (25 mg total) by mouth daily. Start taking on: September 10, 2019  Indication: mood stability      Follow-up Information    [facility] Follow up.   Specialty: Behavioral Health Why: Visits are virtual due to Covid restrictions.  Questions/concerns prior to appt call [phone], M-T 8:30-5p, F 8:30-12p 09/10/19 w/LCSW, Maye Hides -[place] at 8 AM  [phone] 09/14/19 w/Dr. Cherlynn Perches at 9 AM [phone]  Contact information: [place] [phone]          Follow-up recommendations:  Continue activity as tolerated. Continue diet as recommended by your PCP. Ensure to keep all appointments with outpatient providers.  Comments:  Patient is instructed prior to discharge to: Take  all medications as prescribed by his/her mental healthcare provider. Report any adverse effects and or reactions from the medicines to his/her outpatient provider promptly. Patient has been instructed & cautioned: To not engage in alcohol and or illegal drug use while on prescription medicines. In the event of worsening symptoms, patient is instructed to call the crisis hotline, 911 and or go to the nearest ED for appropriate evaluation and treatment of symptoms. To follow-up with his/her primary care provider for your other medical issues, concerns and or health care needs.    Signed: [place], FNP 09/09/2019, 10:04 AM

## 2019-09-09 NOTE — Progress Notes (Signed)
Patient slept throughout the night and appeared to be comfortable in bed. Woke up in the morning and completed morning routines. No major concern throughout this shift.

## 2019-09-10 ENCOUNTER — Ambulatory Visit (INDEPENDENT_AMBULATORY_CARE_PROVIDER_SITE_OTHER): Payer: 59 | Admitting: Clinical

## 2019-09-10 ENCOUNTER — Other Ambulatory Visit: Payer: Self-pay

## 2019-09-10 DIAGNOSIS — F1220 Cannabis dependence, uncomplicated: Secondary | ICD-10-CM

## 2019-09-10 DIAGNOSIS — F603 Borderline personality disorder: Secondary | ICD-10-CM

## 2019-09-10 DIAGNOSIS — F3162 Bipolar disorder, current episode mixed, moderate: Secondary | ICD-10-CM
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
## 2019-09-14 ENCOUNTER — Ambulatory Visit: Payer: Self-pay | Admitting: Child and Adolescent Psychiatry

## 2019-09-17 ENCOUNTER — Ambulatory Visit (HOSPITAL_COMMUNITY): Payer: 59 | Admitting: Psychiatry

## 2019-09-18 ENCOUNTER — Encounter (HOSPITAL_COMMUNITY): Payer: Self-pay

## 2019-09-18 ENCOUNTER — Ambulatory Visit (HOSPITAL_COMMUNITY): Payer: 59 | Admitting: Clinical

## 2020-04-01 ENCOUNTER — Other Ambulatory Visit (HOSPITAL_COMMUNITY)
Admission: RE | Admit: 2020-04-01 | Discharge: 2020-04-01 | Disposition: A | Discharge status: Home / Self Care | Payer: Self-pay | Source: Ambulatory Visit | Attending: Certified Nurse Midwife | Admitting: Certified Nurse Midwife

## 2020-04-01 ENCOUNTER — Ambulatory Visit (INDEPENDENT_AMBULATORY_CARE_PROVIDER_SITE_OTHER): Payer: Medicaid Other | Admitting: Certified Nurse Midwife

## 2020-04-01 ENCOUNTER — Encounter: Payer: Self-pay | Admitting: Certified Nurse Midwife

## 2020-04-01 VITALS — BP 126/96 | HR 84 | Ht 60.5 in | Wt 201.5 lb

## 2020-04-01 DIAGNOSIS — Z01419 Encounter for gynecological examination (general) (routine) without abnormal findings: Secondary | ICD-10-CM | POA: Diagnosis not present

## 2020-04-01 DIAGNOSIS — Z113 Encounter for screening for infections with a predominantly sexual mode of transmission: Secondary | ICD-10-CM | POA: Diagnosis not present

## 2020-04-01 DIAGNOSIS — N921 Excessive and frequent menstruation with irregular cycle: Secondary | ICD-10-CM

## 2020-04-01 DIAGNOSIS — Z8742 Personal history of other diseases of the female genital tract: Secondary | ICD-10-CM

## 2020-04-01 DIAGNOSIS — Z6838 Body mass index (BMI) 38.0-38.9, adult: Secondary | ICD-10-CM

## 2020-04-01 DIAGNOSIS — Z124 Encounter for screening for malignant neoplasm of cervix: Secondary | ICD-10-CM

## 2020-04-01 MED ORDER — DROSPIRENONE-ETHINYL ESTRADIOL 3-0.02 MG PO TABS: 1.0000 | ORAL_TABLET | Freq: Every day | ORAL | 4 refills | Status: DC

## 2020-04-01 NOTE — Progress Notes (Signed)
Pt here to establish care. She reports that she is having painful and irregular periods. She reports that her last "regular" period was 02/01/20. She spotted for 2 weeks in the month of June, and she has not had a cycle in the month of July. She was told that she has PCOS. Takes norethindrone-ethinyl estradiol (Cyclafem) daily.

## 2020-04-01 NOTE — Patient Instructions (Signed)
Drospirenone; Ethinyl Estradiol tablets What is this medicine? DROSPIRENONE; ETHINYL ESTRADIOL (dro SPY re nown; ETH in il es tra DYE ole) is an oral contraceptive (birth control pill). This medicine combines two types of female hormones, an estrogen and a progestin. It is used to prevent ovulation and pregnancy. This medicine may be used for other purposes; ask your health care provider or pharmacist if you have questions. COMMON BRAND NAME(S): Standley Dakins, Lo-Zumandimine, Steele Sizer 28-Day, Ocella, Syeda, Vestura, Lauretta Grill, Zumandimine What should I tell my health care provider before I take this medicine? They need to know if you have or ever had any of these conditions:  abnormal vaginal bleeding  adrenal gland disease  blood vessel disease or blood clots  breast, cervical, endometrial, ovarian, liver, or uterine cancer  diabetes  gallbladder disease  heart disease or recent heart attack  high blood pressure  high cholesterol  high potassium level  kidney disease  liver disease  migraine headaches  stroke  systemic lupus erythematosus (SLE)  tobacco smoker  an unusual or allergic reaction to estrogens, progestins, or other medicines, foods, dyes, or preservatives  pregnant or trying to get pregnant  breast-feeding How should I use this medicine? Take this medicine by mouth. To reduce nausea, this medicine may be taken with food. Follow the directions on the prescription label. Take this medicine at the same time each day and in the order directed on the package. Do not take your medicine more often than directed. A patient package insert for the product will be given with each prescription and refill. Read this sheet carefully each time. The sheet may change frequently. Talk to your pediatrician regarding the use of this medicine in children. Special care may be needed. This medicine has been used in female children who have started having  menstrual periods. Overdosage: If you think you have taken too much of this medicine contact a poison control center or emergency room at once. NOTE: This medicine is only for you. Do not share this medicine with others. What if I miss a dose? If you miss a dose, refer to the patient information sheet you received with your medicine for direction. If you miss more than one pill, this medicine may not be as effective and you may need to use another form of birth control. What may interact with this medicine? Do not take this medicine with any of the following medications:  aminoglutethimide  amprenavir, fosamprenavir  atazanavir; cobicistat  anastrozole  bosentan  exemestane  letrozole  metyrapone  testolactone This medicine may also interact with the following medications:  acetaminophen  antiviral medicines for HIV or AIDS  aprepitant  barbiturates  certain antibiotics like rifampin, rifabutin, rifapentine, and possibly penicillins or tetracyclines  certain diuretics like amiloride, spironolactone, triamterene  certain medicines for fungal infections like griseofulvin, ketoconazole, itraconazole  certain medications for high blood pressure or heart conditions like ACE-inhibitors, Angiotensin-II receptor blockers, eplerenone  certain medicines for seizures like carbamazepine, oxcarbazepine, phenobarbital, phenytoin  cholestyramine  cobicistat  corticosteroid like hydrocortisone and prednisolone  cyclosporine  dantrolene  felbamate  grapefruit juice  heparin  lamotrigine  medicines for diabetes, including pioglitazone  modafinil  NSAIDs  potassium supplements  pyrimethamine  raloxifene  St. John's wort  sulfasalazine  tamoxifen  topiramate  thyroid hormones  warfarin his list may not describe all possible interactions. Give your health care provider a list of all the medicines, herbs, non-prescription drugs, or dietary supplements  you use. Also tell them  if you smoke, drink alcohol, or use illegal drugs. Some items may interact with your medicine. This list may not describe all possible interactions. Give your health care provider a list of all the medicines, herbs, non-prescription drugs, or dietary supplements you use. Also tell them if you smoke, drink alcohol, or use illegal drugs. Some items may interact with your medicine. What should I watch for while using this medicine? Visit your doctor or health care professional for regular checks on your progress. You will need a regular breast and pelvic exam and Pap smear while on this medicine. Use an additional method of contraception during the first cycle that you take these tablets. If you have any reason to think you are pregnant, stop taking this medicine right away and contact your doctor or health care professional. If you are taking this medicine for hormone related problems, it may take several cycles of use to see improvement in your condition. Smoking increases the risk of getting a blood clot or having a stroke while you are taking birth control pills, especially if you are more than 24 years old. You are strongly advised not to smoke. This medicine can make your body retain fluid, making your fingers, hands, or ankles swell. Your blood pressure can go up. Contact your doctor or health care professional if you feel you are retaining fluid. This medicine can make you more sensitive to the sun. Keep out of the sun. If you cannot avoid being in the sun, wear protective clothing and use sunscreen. Do not use sun lamps or tanning beds/booths. If you wear contact lenses and notice visual changes, or if the lenses begin to feel uncomfortable, consult your eye care specialist. In some women, tenderness, swelling, or minor bleeding of the gums may occur. Notify your dentist if this happens. Brushing and flossing your teeth regularly may help limit this. See your dentist  regularly and inform your dentist of the medicines you are taking. If you are going to have elective surgery, you may need to stop taking this medicine before the surgery. Consult your health care professional for advice. This medicine does not protect you against HIV infection (AIDS) or any other sexually transmitted diseases. What side effects may I notice from receiving this medicine? Side effects that you should report to your doctor or health care professional as soon as possible:  allergic reactions like skin rash, itching or hives, swelling of the face, lips, or tongue  breast tissue changes or discharge  changes in vision  chest pain  confusion, trouble speaking or understanding  dark urine  general ill feeling or flu-like symptoms  light-colored stools  nausea, vomiting  pain, swelling, warmth in the leg  right upper belly pain  severe headaches  shortness of breath  sudden numbness or weakness of the face, arm or leg  trouble walking, dizziness, loss of balance or coordination  unusual vaginal bleeding  yellowing of the eyes or skin Side effects that usually do not require medical attention (report to your doctor or health care professional if they continue or are bothersome):  acne  brown spots on the face  change in appetite  change in sexual desire  depressed mood or mood swings  fluid retention and swelling  stomach cramps or bloating  unusually weak or tired  weight gain This list may not describe all possible side effects. Call your doctor for medical advice about side effects. You may report side effects to FDA at 1-800-FDA-1088. Where should I  keep my medicine? Keep out of the reach of children. Store at room temperature between 15 and 30 degrees C (59 and 86 degrees F). Throw away any unused medicine after the expiration date. NOTE: This sheet is a summary. It may not cover all possible information. If you have questions about this  medicine, talk to your doctor, pharmacist, or health care provider.  2020 Elsevier/Gold Standard (2016-05-11 13:52:56)   Preventive Care 82-71 Years Old, Female Preventive care refers to visits with your health care provider and lifestyle choices that can promote health and wellness. This includes:  A yearly physical exam. This may also be called an annual well check.  Regular dental visits and eye exams.  Immunizations.  Screening for certain conditions.  Healthy lifestyle choices, such as eating a healthy diet, getting regular exercise, not using drugs or products that contain nicotine and tobacco, and limiting alcohol use. What can I expect for my preventive care visit? Physical exam Your health care provider will check your:  Height and weight. This may be used to calculate body mass index (BMI), which tells if you are at a healthy weight.  Heart rate and blood pressure.  Skin for abnormal spots. Counseling Your health care provider may ask you questions about your:  Alcohol, tobacco, and drug use.  Emotional well-being.  Home and relationship well-being.  Sexual activity.  Eating habits.  Work and work Statistician.  Method of birth control.  Menstrual cycle.  Pregnancy history. What immunizations do I need?  Influenza (flu) vaccine  This is recommended every year. Tetanus, diphtheria, and pertussis (Tdap) vaccine  You may need a Td booster every 10 years. Varicella (chickenpox) vaccine  You may need this if you have not been vaccinated. Human papillomavirus (HPV) vaccine  If recommended by your health care provider, you may need three doses over 6 months. Measles, mumps, and rubella (MMR) vaccine  You may need at least one dose of MMR. You may also need a second dose. Meningococcal conjugate (MenACWY) vaccine  One dose is recommended if you are age 99-21 years and a first-year college student living in a residence hall, or if you have one of  several medical conditions. You may also need additional booster doses. Pneumococcal conjugate (PCV13) vaccine  You may need this if you have certain conditions and were not previously vaccinated. Pneumococcal polysaccharide (PPSV23) vaccine  You may need one or two doses if you smoke cigarettes or if you have certain conditions. Hepatitis A vaccine  You may need this if you have certain conditions or if you travel or work in places where you may be exposed to hepatitis A. Hepatitis B vaccine  You may need this if you have certain conditions or if you travel or work in places where you may be exposed to hepatitis B. Haemophilus influenzae type b (Hib) vaccine  You may need this if you have certain conditions. You may receive vaccines as individual doses or as more than one vaccine together in one shot (combination vaccines). Talk with your health care provider about the risks and benefits of combination vaccines. What tests do I need?  Blood tests  Lipid and cholesterol levels. These may be checked every 5 years starting at age 60.  Hepatitis C test.  Hepatitis B test. Screening  Diabetes screening. This is done by checking your blood sugar (glucose) after you have not eaten for a while (fasting).  Sexually transmitted disease (STD) testing.  BRCA-related cancer screening. This may be done if  you have a family history of breast, ovarian, tubal, or peritoneal cancers.  Pelvic exam and Pap test. This may be done every 3 years starting at age 1. Starting at age 7, this may be done every 5 years if you have a Pap test in combination with an HPV test. Talk with your health care provider about your test results, treatment options, and if necessary, the need for more tests. Follow these instructions at home: Eating and drinking   Eat a diet that includes fresh fruits and vegetables, whole grains, lean protein, and low-fat dairy.  Take vitamin and mineral supplements as  recommended by your health care provider.  Do not drink alcohol if: ? Your health care provider tells you not to drink. ? You are pregnant, may be pregnant, or are planning to become pregnant.  If you drink alcohol: ? Limit how much you have to 0-1 drink a day. ? Be aware of how much alcohol is in your drink. In the [place] one drink equals one 12 oz bottle of beer (355 mL), one 5 oz glass of wine (148 mL), or one 1 oz glass of hard liquor (44 mL). Lifestyle  Take daily care of your teeth and gums.  Stay active. Exercise for at least 30 minutes on 5 or more days each week.  Do not use any products that contain nicotine or tobacco, such as cigarettes, e-cigarettes, and chewing tobacco. If you need help quitting, ask your health care provider.  If you are sexually active, practice safe sex. Use a condom or other form of birth control (contraception) in order to prevent pregnancy and STIs (sexually transmitted infections). If you plan to become pregnant, see your health care provider for a preconception visit. What's next?  Visit your health care provider once a year for a well check visit.  Ask your health care provider how often you should have your eyes and teeth checked.  Stay up to date on all vaccines. This information is not intended to replace advice given to you by your health care provider. Make sure you discuss any questions you have with your health care provider. Document Revised: 05/01/2018 Document Reviewed: 05/01/2018 Elsevier Patient Education  [place].   Polycystic Ovarian Syndrome  Polycystic ovarian syndrome (PCOS) is a common hormonal disorder among women of reproductive age. In most women with PCOS, many small fluid-filled sacs (cysts) grow on the ovaries, and the cysts are not part of a normal menstrual cycle. PCOS can cause problems with your menstrual periods and make it difficult to get pregnant. It can also cause an increased risk of miscarriage with  pregnancy. If it is not treated, PCOS can lead to serious health problems, such as diabetes and heart disease. What are the causes? The cause of PCOS is not known, but it may be the result of a combination of certain factors, such as:  Irregular menstrual cycle.  High levels of certain hormones (androgens).  Problems with the hormone that helps to control blood sugar (insulin resistance).  Certain genes. What increases the risk? This condition is more likely to develop in women who have a family history of PCOS. What are the signs or symptoms? Symptoms of PCOS may include:  Multiple ovarian cysts.  Infrequent periods or no periods.  Periods that are too frequent or too heavy.  Unpredictable periods.  Inability to get pregnant (infertility) because of not ovulating.  Increased growth of hair on the face, chest, stomach, back, thumbs, thighs, or toes.  Acne or oily skin. Acne may develop during adulthood, and it may not respond to treatment.  Pelvic pain.  Weight gain or obesity.  Patches of thickened and dark brown or black skin on the neck, arms, breasts, or thighs (acanthosis nigricans).  Excess hair growth on the face, chest, abdomen, or upper thighs (hirsutism). How is this diagnosed? This condition is diagnosed based on:  Your medical history.  A physical exam, including a pelvic exam. Your health care provider may look for areas of increased hair growth on your skin.  Tests, such as: ? Ultrasound. This may be used to examine the ovaries and the lining of the uterus (endometrium) for cysts. ? Blood tests. These may be used to check levels of sugar (glucose), female hormone (testosterone), and female hormones (estrogen and progesterone) in your blood. How is this treated? There is no cure for PCOS, but treatment can help to manage symptoms and prevent more health problems from developing. Treatment varies depending on:  Your symptoms.  Whether you want to have a  baby or whether you need birth control (contraception). Treatment may include nutrition and lifestyle changes along with:  Progesterone hormone to start a menstrual period.  Birth control pills to help you have regular menstrual periods.  Medicines to make you ovulate, if you want to get pregnant.  Medicine to reduce excessive hair growth.  Surgery, in severe cases. This may involve making small holes in one or both of your ovaries. This decreases the amount of testosterone that your body produces. Follow these instructions at home:  Take over-the-counter and prescription medicines only as told by your health care provider.  Follow a healthy meal plan. This can help you reduce the effects of PCOS. ? Eat a healthy diet that includes lean proteins, complex carbohydrates, fresh fruits and vegetables, low-fat dairy products, and healthy fats. Make sure to eat enough fiber.  If you are overweight, lose weight as told by your health care provider. ? Losing 10% of your body weight may improve symptoms. ? Your health care provider can determine how much weight loss is best for you and can help you lose weight safely.  Keep all follow-up visits as told by your health care provider. This is important. Contact a health care provider if:  Your symptoms do not get better with medicine.  You develop new symptoms. This information is not intended to replace advice given to you by your health care provider. Make sure you discuss any questions you have with your health care provider. Document Revised: 08/02/2017 Document Reviewed: 02/05/2016 Elsevier Patient Education  2020 [place] for Polycystic Ovary Syndrome Polycystic ovary syndrome (PCOS) is a disorder of the chemicals (hormones) that regulate a woman's reproductive system, including monthly periods (menstruation). The condition causes important hormones to be out of balance. PCOS can:  Stop your periods or make them  irregular.  Cause cysts to develop on your ovaries.  Make it difficult to get pregnant.  Stop your body from responding to the effects of insulin (insulin resistance). Insulin resistance can lead to obesity and diabetes. Changing what you eat can help you manage PCOS and improve your health. Following a balanced diet can help you lose weight and improve the way that your body uses insulin. What are tips for following this plan?  Follow a balanced diet for meals and snacks. Eat breakfast, lunch, dinner, and one or two snacks every day.  Include protein in each meal and snack.  Choose  whole grains instead of products that are made with refined flour.  Eat a variety of foods.  Exercise regularly as told by your health care provider. Aim to do 30 or more minutes of exercise on most days of the week.  If you are overweight or obese: ? Pay attention to how many calories you eat. Cutting down on calories can help you lose weight. ? Work with your health care provider or a diet and nutrition specialist (dietitian) to figure out how many calories you need each day. What foods can I eat?  Fruits Include a variety of colors and types. All fruits are helpful for PCOS. Vegetables Include a variety of colors and types. All vegetables are helpful for PCOS. Grains Whole grains, such as whole wheat. Whole-grain breads, crackers, cereals, and pasta. Unsweetened oatmeal, bulgur, barley, quinoa, and brown rice. Tortillas made from corn or whole-wheat flour. Meats and other proteins Low-fat (lean) proteins, such as fish, chicken, beans, eggs, and tofu. Dairy Low-fat dairy products, such as skim milk, cheese sticks, and yogurt. Beverages Low-fat or fat-free drinks, such as water, low-fat milk, sugar-free drinks, and small amounts of 100% fruit juice. Seasonings and condiments Ketchup. Mustard. Barbecue sauce. Relish. Low-fat or fat-free mayonnaise. Fats and oils Olive oil or canola oil. Walnuts and  almonds. The items listed above may not be a complete list of recommended foods and beverages. Contact a dietitian for more options. What foods are not recommended? Foods that are high in calories or fat. Fried foods. Sweets. Products that are made from refined white flour, including white bread, pastries, white rice, and pasta. The items listed above may not be a complete list of foods and beverages to avoid. Contact a dietitian for more information. Summary  PCOS is a hormonal imbalance that affects a woman's reproductive system.  You can help to manage your PCOS by exercising regularly and eating a healthy, varied diet of vegetables, fruit, whole grains, low-fat (lean) protein, and low-fat dairy products.  Changing what you eat can improve the way that your body uses insulin, help your hormones reach normal levels, and help you lose weight. This information is not intended to replace advice given to you by your health care provider. Make sure you discuss any questions you have with your health care provider. Document Revised: 12/10/2018 Document Reviewed: 06/24/2017 Elsevier Patient Education  [place].

## 2020-04-03 ENCOUNTER — Encounter: Payer: Self-pay | Admitting: Certified Nurse Midwife

## 2020-04-03 NOTE — Progress Notes (Signed)
ANNUAL PREVENTATIVE CARE GYN  ENCOUNTER NOTE  Subjective:       Sandra Marsh is a 24 y.o. G0P0 female here for a routine annual gynecologic exam.  Current complaints: 1. Needs OCP refill  History significant for PCOS, not currently managed with medication.   Denies difficulty breathing or respiratory distress, chest pain, abdominal pain, excessive vaginal bleeding, dysuria, and leg pain or swelling.    Gynecologic History  Patient's last menstrual period was 02/01/2020 (within days).  Contraception: OCP (estrogen/progesterone)  Last Pap: unsure. Results were: normal  Obstetric History  OB History  Gravida Para Term Preterm AB Living  0 0 0 0 0 0  SAB TAB Ectopic Multiple Live Births  0 0 0 0 0    Past Medical History:  Diagnosis Date  . Anxiety 07/09/2018  . MDD (major depressive disorder) 09/07/2019  . PCOS (polycystic ovarian syndrome)     No past surgical history on file.  Current Outpatient Medications on File Prior to Visit  Medication Sig Dispense Refill  . buPROPion (WELLBUTRIN XL) 150 MG 24 hr tablet Take 1 tablet (150 mg total) by mouth daily. 30 tablet 1  . hydrOXYzine (ATARAX/VISTARIL) 25 MG tablet Take 1 tablet (25 mg total) by mouth 3 (three) times daily as needed for itching or anxiety. 30 tablet 1  . lamoTRIgine (LAMICTAL) 25 MG tablet Take 1 tablet (25 mg total) by mouth daily. 30 tablet 1  . norethindrone-ethinyl estradiol (CYCLAFEM) 0.5/0.75/1-35 MG-MCG tablet Take 1 tablet by mouth daily.    Marland Kitchen albuterol (PROVENTIL HFA;VENTOLIN HFA) 108 (90 Base) MCG/ACT inhaler Inhale 2 puffs into the lungs every 6 (six) hours as needed for wheezing or shortness of breath. (Patient not taking: Reported on 04/01/2020) 1 Inhaler 2  . hydrOXYzine (ATARAX/VISTARIL) 50 MG tablet Take 1 tablet (50 mg total) by mouth at bedtime as needed (insomnia). 30 tablet 1   No current facility-administered medications on file prior to visit.    Allergies  Allergen Reactions  . Nickel  Rash    Social History   Socioeconomic History  . Marital status: Single    Spouse name: Not on file  . Number of children: Not on file  . Years of education: Not on file  . Highest education level: Not on file  Occupational History  . Not on file  Tobacco Use  . Smoking status: Former Research scientist (life sciences)  . Smokeless tobacco: Never Used  Substance and Sexual Activity  . Alcohol use: Never  . Drug use: Never  . Sexual activity: Yes  Other Topics Concern  . Not on file  Social History Narrative  . Not on file   Social Determinants of Health   Financial Resource Strain:   . Difficulty of Paying Living Expenses:   Food Insecurity:   . Worried About Charity fundraiser in the Last Year:   . Arboriculturist in the Last Year:   Transportation Needs:   . Film/video editor (Medical):   Marland Kitchen Lack of Transportation (Non-Medical):   Physical Activity:   . Days of Exercise per Week:   . Minutes of Exercise per Session:   Stress:   . Feeling of Stress :   Social Connections:   . Frequency of Communication with Friends and Family:   . Frequency of Social Gatherings with Friends and Family:   . Attends Religious Services:   . Active Member of Clubs or Organizations:   . Attends Archivist Meetings:   Marland Kitchen Marital Status:  Intimate Partner Violence:   . Fear of Current or Ex-Partner:   . Emotionally Abused:   Marland Kitchen Physically Abused:   . Sexually Abused:     Family History  Problem Relation Age of Onset  . Hypertension Paternal Grandmother     The following portions of the patient's history were reviewed and updated as appropriate: allergies, current medications, past family history, past medical history, past social history, past surgical history and problem list.  Review of Systems  ROS negative except as noted above. Information obtained from patient.    Objective:   BP (!) 126/96   Pulse 84   Ht 5' 0.5" (1.537 m)   Wt 201 lb 8 oz (91.4 kg)   LMP 02/01/2020 (Within  Days)   BMI 38.70 kg/m   CONSTITUTIONAL: Well-developed, well-nourished female in no acute distress.   PSYCHIATRIC: Normal mood and affect. Normal behavior. Normal judgment and thought content.  [place]: Alert and oriented to person, place, and time. Normal muscle tone coordination. No cranial nerve deficit noted.  HENT:  Normocephalic, atraumatic, External right and left ear normal.  EYES: Conjunctivae and EOM are normal. Pupils are equal and round.  NECK: Normal range of motion, supple, no masses.  Normal thyroid.   SKIN: Skin is warm and dry. No rash noted. Not diaphoretic. No erythema. No pallor.  CARDIOVASCULAR: Normal heart rate noted, regular rhythm, no murmur.  RESPIRATORY: Clear to auscultation bilaterally. Effort and breath sounds normal, no problems with respiration noted.  BREASTS: Symmetric in size. No masses, skin changes, nipple drainage, or lymphadenopathy. Bilateral nipple piercings present.   ABDOMEN: Soft, normal bowel sounds, no distention noted.  No tenderness, rebound or guarding.   PELVIC:  External Genitalia: Normal  BUS: Normal  Vagina: Normal  Cervix: Normal, Pap collected  Uterus: Normal  Adnexa: Normal  MUSCULOSKELETAL: Normal range of motion. No tenderness.  No cyanosis, clubbing, or edema.  2+ distal pulses.  LYMPHATIC: No Axillary, Supraclavicular, or Inguinal Adenopathy.  Assessment:   Annual gynecologic examination 24 y.o.   Contraception: OCP (estrogen/progesterone)   Obesity 2   Problem List Items Addressed This Visit    None    Visit Diagnoses    Well woman exam    -  Primary   Relevant Orders   Cytology - PAP   Prolactin   Testosterone, Free, Total, SHBG   Progesterone   Thyroid Panel With TSH   FSH/LH   Hemoglobin A1c   Lipid panel   Screening for cervical cancer       Relevant Orders   Cytology - PAP   Menorrhagia with irregular cycle       Relevant Orders   Prolactin   Testosterone, Free, Total, SHBG    Progesterone   Thyroid Panel With TSH   FSH/LH   History of PCOS       Relevant Orders   Prolactin   Testosterone, Free, Total, SHBG   Progesterone   Thyroid Panel With TSH   FSH/LH   Hemoglobin A1c   Lipid panel   Routine screening for STI (sexually transmitted infection)       Relevant Orders   Cytology - PAP   BMI 38.0-38.9,adult       Relevant Orders   Thyroid Panel With TSH   Hemoglobin A1c   Lipid panel      Plan:   Pap: Pap, Reflex if ASCUS and GC/CT NAAT   Labs: See orders  Routine preventative health maintenance measures emphasized: Exercise/Diet/Weight control, Tobacco Warnings,  Alcohol/Substance use risks, Stress Management, Peer Pressure Issues and Safe Sex; see AVS  Rx Yaz, see orders  Reviewed red flag symptoms and when to call  Return to [place] for [employer] or sooner if needed   [name], CNM  [facility], [facility]

## 2020-04-25 LAB — CYTOLOGY - PAP
Chlamydia: NEGATIVE
Comment: NEGATIVE
Comment: NEGATIVE
Comment: NEGATIVE
Comment: NORMAL
Diagnosis: NEGATIVE
HSV1: NEGATIVE
HSV2: NEGATIVE
Neisseria Gonorrhea: NEGATIVE
Trichomonas: NEGATIVE

## 2020-09-07 DIAGNOSIS — F603 Borderline personality disorder: Secondary | ICD-10-CM | POA: Diagnosis not present

## 2020-09-14 DIAGNOSIS — F603 Borderline personality disorder: Secondary | ICD-10-CM | POA: Diagnosis not present

## 2020-09-21 DIAGNOSIS — F603 Borderline personality disorder: Secondary | ICD-10-CM | POA: Diagnosis not present

## 2020-09-28 DIAGNOSIS — F603 Borderline personality disorder: Secondary | ICD-10-CM | POA: Diagnosis not present

## 2020-10-05 DIAGNOSIS — F603 Borderline personality disorder: Secondary | ICD-10-CM | POA: Diagnosis not present

## 2020-10-06 DIAGNOSIS — F39 Unspecified mood [affective] disorder: Secondary | ICD-10-CM | POA: Diagnosis not present

## 2020-10-06 DIAGNOSIS — F5105 Insomnia due to other mental disorder: Secondary | ICD-10-CM | POA: Diagnosis not present

## 2020-10-06 DIAGNOSIS — F509 Eating disorder, unspecified: Secondary | ICD-10-CM | POA: Diagnosis not present

## 2020-10-06 DIAGNOSIS — F411 Generalized anxiety disorder: Secondary | ICD-10-CM | POA: Diagnosis not present

## 2020-10-12 DIAGNOSIS — F603 Borderline personality disorder: Secondary | ICD-10-CM | POA: Diagnosis not present

## 2020-11-02 DIAGNOSIS — F603 Borderline personality disorder: Secondary | ICD-10-CM | POA: Diagnosis not present

## 2020-11-09 DIAGNOSIS — F603 Borderline personality disorder: Secondary | ICD-10-CM | POA: Diagnosis not present

## 2020-11-16 DIAGNOSIS — F603 Borderline personality disorder: Secondary | ICD-10-CM | POA: Diagnosis not present

## 2020-11-23 DIAGNOSIS — F603 Borderline personality disorder: Secondary | ICD-10-CM | POA: Diagnosis not present

## 2020-11-30 DIAGNOSIS — F603 Borderline personality disorder: Secondary | ICD-10-CM | POA: Diagnosis not present

## 2020-12-05 ENCOUNTER — Other Ambulatory Visit: Payer: Self-pay

## 2020-12-05 ENCOUNTER — Encounter: Payer: Self-pay | Admitting: Certified Nurse Midwife

## 2020-12-05 ENCOUNTER — Ambulatory Visit (INDEPENDENT_AMBULATORY_CARE_PROVIDER_SITE_OTHER): Payer: BC Managed Care – PPO | Admitting: Certified Nurse Midwife

## 2020-12-05 VITALS — BP 116/84 | HR 89 | Resp 16 | Ht 60.0 in | Wt 187.6 lb

## 2020-12-05 DIAGNOSIS — N946 Dysmenorrhea, unspecified: Secondary | ICD-10-CM | POA: Diagnosis not present

## 2020-12-05 DIAGNOSIS — Z8742 Personal history of other diseases of the female genital tract: Secondary | ICD-10-CM | POA: Diagnosis not present

## 2020-12-05 DIAGNOSIS — N921 Excessive and frequent menstruation with irregular cycle: Secondary | ICD-10-CM | POA: Diagnosis not present

## 2020-12-05 NOTE — Progress Notes (Signed)
GYN ENCOUNTER NOTE  Subjective:       Sandra Marsh is a 25 y.o. G0P0000 female is here for gynecologic evaluation of the following issues:  1. Heavy, painful irregular menses despite taking Yaz daily as prescribed  Denies difficulty breathing or respiratory distress, chest pain, dysuria, and leg pain or swelling.   History significant for PCOS.    Gynecologic History  Patient's last menstrual period was 11/07/2020 (within days). Period Cycle (Days): 28 Period Duration (Days): Four (4) to five (5) Period Pattern: (!) Irregular Menstrual Flow: Heavy Menstrual Control: Maxi pad Menstrual Control Change Freq (Hours): Two (2) to three (3) Dysmenorrhea: (!) Moderate Dysmenorrhea Symptoms: Cramping  Contraception: OCP (estrogen/progesterone), Yaz  Last Pap: 03/2020. Results were: normal  Obstetric History  OB History  Gravida Para Term Preterm AB Living  0 0 0 0 0 0  SAB IAB Ectopic Multiple Live Births  0 0 0 0 0    Past Medical History:  Diagnosis Date  . Anxiety 07/09/2018  . MDD (major depressive disorder) 09/07/2019  . PCOS (polycystic ovarian syndrome)     History reviewed. No pertinent surgical history.  Current Outpatient Medications on File Prior to Visit  Medication Sig Dispense Refill  . buPROPion (WELLBUTRIN XL) 150 MG 24 hr tablet Take 1 tablet (150 mg total) by mouth daily. 30 tablet 1  . drospirenone-ethinyl estradiol (YAZ) 3-0.02 MG tablet Take 1 tablet by mouth daily. 84 tablet 4  . hydrOXYzine (ATARAX/VISTARIL) 25 MG tablet Take 1 tablet (25 mg total) by mouth 3 (three) times daily as needed for itching or anxiety. 30 tablet 1  . lamoTRIgine (LAMICTAL) 200 MG tablet Take by mouth.    . norethindrone-ethinyl estradiol (CYCLAFEM) 0.5/0.75/1-35 MG-MCG tablet Take 1 tablet by mouth daily.     No current facility-administered medications on file prior to visit.    Allergies  Allergen Reactions  . Nickel Rash    Social History   Socioeconomic History   . Marital status: Single    Spouse name: Not on file  . Number of children: Not on file  . Years of education: Not on file  . Highest education level: Not on file  Occupational History  . Not on file  Tobacco Use  . Smoking status: Former Research scientist (life sciences)  . Smokeless tobacco: Never Used  Substance and Sexual Activity  . Alcohol use: Never  . Drug use: Never  . Sexual activity: Yes  Other Topics Concern  . Not on file  Social History Narrative  . Not on file   Social Determinants of Health   Financial Resource Strain: Not on file  Food Insecurity: Not on file  Transportation Needs: Not on file  Physical Activity: Not on file  Stress: Not on file  Social Connections: Not on file  Intimate Partner Violence: Not on file    Family History  Problem Relation Age of Onset  . Hypertension Paternal Grandmother     The following portions of the patient's history were reviewed and updated as appropriate: allergies, current medications, past family history, past medical history, past social history, past surgical history and problem list.  Review of Systems  ROS negative except as noted above. Information obtained from patient.   Objective:   BP 116/84   Pulse 89   Resp 16   Ht 5' (1.524 m)   Wt 187 lb 9.6 oz (85.1 kg)   LMP 11/07/2020 (Within Days)   BMI 36.64 kg/m    CONSTITUTIONAL: Well-developed, well-nourished female in no  acute distress.   PHYSICAL EXAM: Not indicated.   Assessment:   1. History of PCOS  - US PELVIC COMPLETE WITH TRANSVAGINAL; Future - Testosterone, Free, Total, SHBG - Prolactin - TSH - FSH/LH - Progesterone - Hemoglobin A1c  2. Menorrhagia with irregular cycle  - US PELVIC COMPLETE WITH TRANSVAGINAL; Future - Testosterone, Free, Total, SHBG - Prolactin - TSH - FSH/LH - Progesterone - Hemoglobin A1c  3. Dysmenorrhea  - US PELVIC COMPLETE WITH TRANSVAGINAL; Future - Testosterone, Free, Total, SHBG - Prolactin - TSH - FSH/LH -  Progesterone - Hemoglobin A1c     Plan:   Labs today, see orders.   Ultrasound ordered, see chart.   Discussed menses and PCOS management options including diet, herbs and use of hormonal contraception; patient considering Mirena placement.   Reviewed red flag symptoms and when to call.   RTC for Mirena placement if desired.   RTC if symptoms worsen or fail to improve.    [name], CNM [facility], [facility] 12/05/20 12:15 PM

## 2020-12-05 NOTE — Patient Instructions (Addendum)
Levonorgestrel intrauterine device (IUD) What is this medicine? LEVONORGESTREL IUD (LEE voe nor jes trel) is a contraceptive (birth control) device. The device is placed inside the uterus by a health care provider. It is used to prevent pregnancy. Some devices can also be used to treat heavy bleeding that occurs during your period. This medicine may be used for other purposes; ask your health care provider or pharmacist if you have questions. COMMON BRAND NAME(S): Kyleena, LILETTA, Mirena, Skyla What should I tell my health care provider before I take this medicine? They need to know if you have any of these conditions:  abnormal Pap smear  cancer of the breast, uterus, or cervix  diabetes  endometritis  genital or pelvic infection now or in the past  have more than one sexual partner or your partner has more than one partner  heart disease  history of an ectopic or tubal pregnancy  immune system problems  IUD in place  liver disease or tumor  problems with blood clots or take blood-thinners  seizures  use intravenous drugs  uterus of unusual shape  vaginal bleeding that has not been explained  an unusual or allergic reaction to levonorgestrel, other hormones, silicone, or polyethylene, medicines, foods, dyes, or preservatives  pregnant or trying to get pregnant  breast-feeding How should I use this medicine? This device is placed inside the uterus by a health care professional. Talk to your pediatrician regarding the use of this medicine in children. Special care may be needed. Overdosage: If you think you have taken too much of this medicine contact a poison control center or emergency room at once. NOTE: This medicine is only for you. Do not share this medicine with others. What if I miss a dose? This does not apply. Depending on the brand of device you have inserted, the device will need to be replaced every 3 to 7 years if you wish to continue using this type  of birth control. What may interact with this medicine? Do not take this medicine with any of the following medications:  amprenavir  bosentan  fosamprenavir This medicine may also interact with the following medications:  aprepitant  armodafinil  barbiturate medicines for inducing sleep or treating seizures  bexarotene  boceprevir  griseofulvin  medicines to treat seizures like carbamazepine, ethotoin, felbamate, oxcarbazepine, phenytoin, topiramate  modafinil  pioglitazone  rifabutin  rifampin  rifapentine  some medicines to treat HIV infection like atazanavir, efavirenz, indinavir, lopinavir, nelfinavir, tipranavir, ritonavir  St. John's wort  warfarin This list may not describe all possible interactions. Give your health care provider a list of all the medicines, herbs, non-prescription drugs, or dietary supplements you use. Also tell them if you smoke, drink alcohol, or use illegal drugs. Some items may interact with your medicine. What should I watch for while using this medicine? Visit your doctor or health care professional for regular check ups. See your doctor if you or your partner has sexual contact with others, becomes HIV positive, or gets a sexual transmitted disease. This product does not protect you against HIV infection (AIDS) or other sexually transmitted diseases. You can check the placement of the IUD yourself by reaching up to the top of your vagina with clean fingers to feel the threads. Do not pull on the threads. It is a good habit to check placement after each menstrual period. Call your doctor right away if you feel more of the IUD than just the threads or if you cannot feel the threads   at all. The IUD may come out by itself. You may become pregnant if the device comes out. If you notice that the IUD has come out use a backup birth control method like condoms and call your health care provider. Using tampons will not change the position of the  IUD and are okay to use during your period. This IUD can be safely scanned with magnetic resonance imaging (MRI) only under specific conditions. Before you have an MRI, tell your healthcare provider that you have an IUD in place, and which type of IUD you have in place. What side effects may I notice from receiving this medicine? Side effects that you should report to your doctor or health care professional as soon as possible:  allergic reactions like skin rash, itching or hives, swelling of the face, lips, or tongue  fever, flu-like symptoms  genital sores  high blood pressure  no menstrual period for 6 weeks during use  pain, swelling, warmth in the leg  pelvic pain or tenderness  severe or sudden headache  signs of pregnancy  stomach cramping  sudden shortness of breath  trouble with balance, talking, or walking  unusual vaginal bleeding, discharge  yellowing of the eyes or skin Side effects that usually do not require medical attention (report to your doctor or health care professional if they continue or are bothersome):  acne  breast pain  change in sex drive or performance  changes in weight  cramping, dizziness, or faintness while the device is being inserted  headache  irregular menstrual bleeding within first 3 to 6 months of use  nausea This list may not describe all possible side effects. Call your doctor for medical advice about side effects. You may report side effects to FDA at 1-800-FDA-1088. Where should I keep my medicine? This does not apply. NOTE: This sheet is a summary. It may not cover all possible information. If you have questions about this medicine, talk to your doctor, pharmacist, or health care provider.  2021 Elsevier/Gold Standard (2020-04-19 16:27:45)   Menorrhagia Menorrhagia is when your monthly periods are heavy or last longer than normal. If you have this condition, bleeding and cramping may make it hard for you to do your  daily activities. What are the causes? Common causes of this condition include:  Growths in the womb (uterus). These are polyps or fibroids. These growths are not cancer.  Problems with two hormones called estrogen and progesterone.  One of the ovaries not releasing an egg during one or more months.  A problem with the thyroid gland.  Having a device for birth control (IUD).  Side effects of some medicines, such as NSAIDs or blood thinners.  A disorder that stops the blood from clotting normally. What increases the risk? You are more likely to have this condition if you have cancer of the womb. What are the signs or symptoms?  Having to change your pad or tampon every 1-2 hours because it is soaked.  Needing to use pads and tampons at the same time because of heavy bleeding.  Needing to wake up to change your pads or tampons during the night.  Passing blood clots larger than 1 inch (2.5 cm) in size.  Having bleeding that lasts for more than 7 days.  Having symptoms of low iron levels (anemia), such as feeling tired or having shortness of breath. How is this treated? You may not need to be treated for this condition. But if you need treatment, you may  be given medicines:  To reduce bleeding during your period. These include birth control medicines.  To make your blood thick. This slows bleeding.  To reduce swelling. Medicines that do this include ibuprofen.  That have a hormone called progestin.  That make the ovaries stop working for a short time.  To treat low iron levels. You will be given iron pills if you have this condition. If medicines do not work, surgery may be done. Surgery may be done to:  Remove a part of the lining of the womb. This lining is called the endometrium. This reduces bleeding during a period.  Remove growths in the womb. These may be polyps or fibroids.  Remove the entire lining of the womb.  Remove the womb entirely. This procedure is  called a hysterectomy.   Follow these instructions at home: Medicines  Take over-the-counter and prescription medicines only as told by your doctor. This includes iron pills.  Do not change or switch medicines without asking your doctor.  Do not take aspirin or medicines that contain aspirin 1 week before or during your period. Aspirin may make bleeding worse. Managing constipation Iron pills may cause trouble pooping (constipation). To prevent or treat problems when pooping, you may need to:  Drink enough fluid to keep your pee (urine) pale yellow.  Take over-the-counter or prescription medicines.  Eat foods that are high in fiber. These include beans, whole grains, and fresh fruits and vegetables.  Limit foods that are high in fat and sugar. These include fried or sweet foods. General instructions  If you need to change your pad or tampon more than once every 2 hours, limit your activity until the bleeding stops.  Eat healthy meals and foods that are high in iron. Foods that have a lot of iron include: ? Leafy green vegetables. ? Meat. ? Liver. ? Eggs. ? Whole-grain breads and cereals.  Do not try to lose weight until your heavy bleeding has stopped and you have normal amounts of iron in your blood. If you need to lose weight, work with your doctor.  Keep all follow-up visits. Contact a doctor if:  You soak through a pad or tampon every 1 or 2 hours, and this happens every time you have a period.  You need to use pads and tampons at the same time because you are bleeding so much.  You are taking medicine, and: ? You feel like you may vomit. ? You vomit. ? You have watery poop (diarrhea).  You have other problems that may be related to the medicine you are taking. Get help right away if:  You soak through more than a pad or tampon in 1 hour.  You pass clots bigger than 1 inch (2.5 cm) wide.  You feel short of breath.  You feel like your heart is beating too  fast.  You feel dizzy or you faint.  You feel very weak or tired. Summary  Menorrhagia is when your menstrual periods are heavy or last longer than normal.  You may not need to be treated for this condition. If you need treatment, you may be given medicines or have surgery.  Take over-the-counter and prescription medicines only as told by your doctor. This includes iron pills.  Get help right away if you soak through more than a pad or tampon in 1 hour or you pass large clots. Also, get help right away if you feel dizzy, short of breath, or very weak or tired. This information is  not intended to replace advice given to you by your health care provider. Make sure you discuss any questions you have with your health care provider. Document Revised: 05/03/2020 Document Reviewed: 05/03/2020 Elsevier Patient Education  2021 [employer].   Dysmenorrhea Dysmenorrhea means cramps during your period (menstrual period) that cause pain in your lower belly (abdomen). The pain is caused by the tightening (contracting) of the muscles of the womb (uterus). The pain may be mild or very bad. Primary dysmenorrhea is cramps that last a couple of days when a woman starts having periods or soon after. As a woman gets older or has a baby, the cramps will usually lessen or disappear. Secondary dysmenorrhea begins later in life and is caused by other problems. What are the causes? This condition may be caused by problems with the:  Tissue that lines the womb. This tissue may grow: ? Outside of the womb. ? Into the walls of the womb.  Blood vessels in the area between your hip bones (pelvis).  Tissue in the lower part of the womb (cervix), including growths (polyps).  Muscles that hold up the womb.  Bladder.  Bowels. It can also be caused by cancer. Other causes include:  A very tipped womb.  The lower part of the womb having a small opening.  Tumors in the womb that are not cancer.  Pelvic  inflammatory disease (PID).  Scars from surgeries you have had.  A cyst in the ovaries.  An IUD (intrauterine device). What increases the risk?  Being younger than age 77.  Having started puberty early.  Having irregular bleeding or heavy bleeding.  Never having given birth.  Having a family history of period cramps.  Smoking or using products with nicotine.  Having a high body weight or a low body weight. What are the signs or symptoms?  Cramps and pain in the lower belly or lower back.  A feeling of fullness in the lower belly.  Periods lasting for longer than 7 days.  Headaches.  Bloating.  Tiredness (fatigue).  Feeling like you may vomit (nauseous) or vomiting.  Watery poop (diarrhea) or loose poop (stool).  Sweating.  Dizziness. How is this treated? Treatment depends on the cause of the cramps. Treatment may include medicines, such as:  Medicines for pain.  Medicines for bleeding.  Body chemical (hormone) replacement therapy. ? Shots (injections) to stop the menstrual period. ? Birth control pills. ? An IUD.  NSAIDs, such as ibuprofen. Other treatments may include:  Surgeries.  Procedures.  Nerve stimulation.  Doing exercises.  Yoga and alternative treatments. Work with your doctor to find what treatments are best for you. Follow these instructions at home: Helping pain and cramping  If told, put heat on your lower back or belly when you have pain or cramps. Do this as often as told by your doctor. Use the heat source that your doctor recommends, such as a moist heat pack or a heating pad. ? Place a towel between your skin and the heat. ? Leave the heat on for 20-30 minutes. ? Take off the heat if your skin turns bright red. This is very important. If you cannot feel pain, heat, or cold, you have a greater risk of getting burned.  Do not sleep with a heating pad.  Exercise. Walking, swimming, or biking can help take away  cramps.  Massage your lower back or belly. This may help lessen pain.   General instructions  Take over-the-counter and prescription medicines only  as told by your doctor.  Ask your doctor if you should avoid driving or using machines while you are taking your medicine.  Avoid alcohol and caffeine during and right before your period. These can make cramps worse.  Do not smoke or use any products that contain nicotine or tobacco. If you need help quitting, ask your doctor.  Keep all follow-up visits. Contact a doctor if:  You have pain that gets worse.  You have pain that does not get better with medicine.  You have pain during sex.  You feel like you may vomit or you vomit during your period and medicine does not help. Get help right away if:  You faint. Summary  Dysmenorrhea means painful cramps during your period.  Put heat on your lower back or belly when you have pain or cramps.  Do exercises like walking, swimming, or biking to help with cramps.  Contact a doctor if you have pain during sex. This information is not intended to replace advice given to you by your health care provider. Make sure you discuss any questions you have with your health care provider. Document Revised: 04/06/2020 Document Reviewed: 04/06/2020 Elsevier Patient Education  2021 [employer].   Polycystic Ovary Syndrome  Polycystic ovarian syndrome (PCOS) is a common hormonal disorder among women of reproductive age. In most women with PCOS, small fluid-filled sacs (cysts) grow on the ovaries. PCOS can cause problems with menstrual periods and make it hard to get and stay pregnant. If this condition is not treated, it can lead to serious health problems, such as diabetes and heart disease. What are the causes? The cause of this condition is not known. It may be due to certain factors, such as:  Irregular menstrual cycle.  High levels of certain hormones.  Problems with the hormone that  helps to control blood sugar (insulin).  Certain genes. What increases the risk? You are more likely to develop this condition if you:  Have a family history of PCOS or type 2 diabetes.  Are overweight, eat unhealthy foods, and are not active. These factors may cause problems with blood sugar control, which can contribute to PCOS or PCOS symptoms. What are the signs or symptoms? Symptoms of this condition include:  Ovarian cysts and sometimes pelvic pain.  Menstrual periods that are not regular or are too heavy.  Inability to get or stay pregnant.  Increased growth of hair on the face, chest, stomach, back, thumbs, thighs, or toes.  Acne or oily skin. Acne may develop during adulthood, and it may not get better with treatment.  Weight gain or obesity.  Patches of thickened and dark brown or black skin on the neck, arms, breasts, or thighs. How is this diagnosed? This condition is diagnosed based on:  Your medical history.  A physical exam that includes a pelvic exam. Your health care provider may look for areas of increased hair growth on your skin.  Tests, such as: ? An ultrasound to check the ovaries for cysts and to view the lining of the uterus. ? Blood tests to check levels of sugar (glucose), female hormone (testosterone), and female hormones (estrogen and progesterone). How is this treated? There is no cure for this condition, but treatment can help to manage symptoms and prevent more health problems from developing. Treatment varies depending on your symptoms and if you want to have a baby or if you need birth control. Treatment may include:  Making nutrition and lifestyle changes.  Taking the progesterone  hormone to start a menstrual period.  Taking birth control pills to help you have regular menstrual periods.  Taking medicines such as: ? Medicines to make you ovulate, if you want to get pregnant. ? Medicine to reduce extra hair growth.  Having surgery in  severe cases. This may involve making small holes in one or both of your ovaries. This decreases the amount of testosterone that your body makes. Follow these instructions at home:  Take over-the-counter and prescription medicines only as told by your health care provider.  Follow a healthy meal plan that includes lean proteins, complex carbohydrates, fresh fruits and vegetables, low-fat dairy products, healthy fats, and fiber.  If you are overweight, lose weight as told by your health care provider. Your health care provider can determine how much weight loss is best for you and can help you lose weight safely.  Keep all follow-up visits. This is important. Contact a health care provider if:  Your symptoms do not get better with medicine.  Your symptoms get worse or you develop new symptoms. Summary  Polycystic ovarian syndrome (PCOS) is a common hormonal disorder among women of reproductive age.  PCOS can cause problems with menstrual periods and make it hard to get and stay pregnant.  If this condition is not treated, it can lead to serious health problems, such as diabetes and heart disease.  There is no cure for this condition, but treatment can help to manage symptoms and prevent more health problems from developing. This information is not intended to replace advice given to you by your health care provider. Make sure you discuss any questions you have with your health care provider. Document Revised: 01/28/2020 Document Reviewed: 01/28/2020 Elsevier Patient Education  [employer].    Diet for Polycystic Ovary Syndrome Polycystic ovary syndrome (PCOS) is a common hormonal disorder that affects a woman's reproductive system. It can cause problems with menstrual periods and make it hard to get and stay pregnant. Changing what you eat can help your hormones reach normal levels, improve your health, and help you better manage PCOS. Following a balanced diet can help you lose  weight and improve the way that your body uses the hormone insulin to control blood sugar. This may include:  Eating low-fat (lean) proteins, complex carbohydrates, fresh fruits and vegetables, low-fat dairy products, healthy fats, and fiber.  Cutting down on calories.  Exercising regularly. What are tips for following this plan?  Follow a balanced diet for meals and snacks. Eat breakfast, lunch, dinner, and one or two snacks every day.  Include protein in each meal and snack.  Choose whole grains instead of products that are made with refined flour.  Eat a variety of foods.  Exercise regularly as told by your health care provider. Aim to do at least 30 minutes of exercise on most days of the week.  If you are overweight or obese: ? Pay attention to how many calories you eat. Cutting down on calories can help you lose weight. ? Work with your health care provider or a dietitian to figure out how many calories you need each day. What foods should I eat? Fruits Include a variety of colors and types. All fruits are helpful for PCOS. Vegetables Include a variety of colors and types. All vegetables are helpful for PCOS. Grains Whole grains, such as whole wheat. Whole-grain breads, crackers, cereals, and pasta. Unsweetened oatmeal. Bulgur, barley, quinoa, and brown rice. Tortillas made from corn or whole-wheat flour. Meats and other  proteins Lean proteins, such as fish, chicken, beans, eggs, and tofu. Dairy Low-fat dairy products, such as skim milk, cheese sticks, and yogurt. Beverages Low-fat or fat-free drinks, such as water, low-fat milk, sugar-free drinks, and small amounts of 100% fruit juice. Seasonings and condiments Ketchup. Mustard. Barbecue sauce. Relish. Low-fat or fat-free mayonnaise. Fats and oils Olive oil or canola oil. Walnuts and almonds. The items listed above may not be a complete list of recommended foods and beverages. Contact a dietitian for more options.    What foods should I avoid? Foods that are high in calories or fat, especially saturated or trans fats. Fried foods. Sweets. Products that are made from refined white flour, including white bread, pastries, white rice, and pasta. The items listed above may not be a complete list of foods and beverages to avoid. Contact a dietitian for more information. Summary  PCOS is a hormonal imbalance that affects a woman's reproductive system. It can cause problems with menstrual periods and make it hard to get and stay pregnant.  You can help to manage your PCOS by exercising regularly and eating a healthy, varied diet of vegetables, fruit, whole grains, lean protein, and low-fat dairy products.  Changing what you eat can improve the way that your body uses insulin, help your hormones reach normal levels, and help you lose weight. This information is not intended to replace advice given to you by your health care provider. Make sure you discuss any questions you have with your health care provider. Document Revised: 01/28/2020 Document Reviewed: 01/28/2020 Elsevier Patient Education  2021 [employer].

## 2020-12-06 LAB — TESTOSTERONE, FREE, TOTAL, SHBG
Sex Hormone Binding: 153 nmol/L — ABNORMAL HIGH (ref 24.6–122.0)
Testosterone, Free: 1.4 pg/mL (ref 0.0–4.2)
Testosterone: 28 ng/dL (ref 13–71)

## 2020-12-06 LAB — FSH/LH
FSH: 8.8 m[IU]/mL
LH: 8.5 m[IU]/mL

## 2020-12-06 LAB — TSH: TSH: 1.33 u[IU]/mL (ref 0.450–4.500)

## 2020-12-06 LAB — HEMOGLOBIN A1C
Est. average glucose Bld gHb Est-mCnc: 103 mg/dL
Hgb A1c MFr Bld: 5.2 % (ref 4.8–5.6)

## 2020-12-06 LAB — PROLACTIN: Prolactin: 18.9 ng/mL (ref 4.8–23.3)

## 2020-12-06 LAB — PROGESTERONE: Progesterone: 0.3 ng/mL

## 2020-12-07 DIAGNOSIS — F603 Borderline personality disorder: Secondary | ICD-10-CM | POA: Diagnosis not present

## 2020-12-28 DIAGNOSIS — F603 Borderline personality disorder: Secondary | ICD-10-CM | POA: Diagnosis not present

## 2021-01-03 DIAGNOSIS — F39 Unspecified mood [affective] disorder: Secondary | ICD-10-CM | POA: Diagnosis not present

## 2021-01-03 DIAGNOSIS — F5105 Insomnia due to other mental disorder: Secondary | ICD-10-CM | POA: Diagnosis not present

## 2021-01-03 DIAGNOSIS — F509 Eating disorder, unspecified: Secondary | ICD-10-CM | POA: Diagnosis not present

## 2021-01-03 DIAGNOSIS — F411 Generalized anxiety disorder: Secondary | ICD-10-CM | POA: Diagnosis not present

## 2021-01-06 ENCOUNTER — Ambulatory Visit
Admission: RE | Admit: 2021-01-06 | Discharge: 2021-01-06 | Disposition: A | Discharge status: Home / Self Care | Payer: BC Managed Care – PPO | Source: Ambulatory Visit | Attending: Certified Nurse Midwife | Admitting: Certified Nurse Midwife

## 2021-01-06 ENCOUNTER — Other Ambulatory Visit: Payer: Self-pay

## 2021-01-06 DIAGNOSIS — N946 Dysmenorrhea, unspecified: Secondary | ICD-10-CM | POA: Insufficient documentation

## 2021-01-06 DIAGNOSIS — Z8742 Personal history of other diseases of the female genital tract: Secondary | ICD-10-CM | POA: Insufficient documentation

## 2021-01-06 DIAGNOSIS — N921 Excessive and frequent menstruation with irregular cycle: Secondary | ICD-10-CM | POA: Diagnosis not present

## 2021-01-06 DIAGNOSIS — N920 Excessive and frequent menstruation with regular cycle: Secondary | ICD-10-CM | POA: Diagnosis not present

## 2021-01-06 DIAGNOSIS — N83291 Other ovarian cyst, right side: Secondary | ICD-10-CM | POA: Diagnosis not present

## 2021-01-09 ENCOUNTER — Telehealth: Payer: Self-pay

## 2021-01-09 ENCOUNTER — Encounter: Payer: Self-pay | Admitting: Certified Nurse Midwife

## 2021-01-09 DIAGNOSIS — N83201 Unspecified ovarian cyst, right side: Secondary | ICD-10-CM | POA: Insufficient documentation

## 2021-01-09 NOTE — Telephone Encounter (Signed)
Sandra Marsh from [facility] called to give results of U/S. Results are in [place].

## 2021-01-11 DIAGNOSIS — F603 Borderline personality disorder: Secondary | ICD-10-CM | POA: Diagnosis not present

## 2021-01-23 NOTE — Progress Notes (Signed)
Pt present for consult for ultrasound review and ovarian cyst.

## 2021-01-24 ENCOUNTER — Ambulatory Visit (INDEPENDENT_AMBULATORY_CARE_PROVIDER_SITE_OTHER): Payer: BC Managed Care – PPO | Admitting: Obstetrics and Gynecology

## 2021-01-24 ENCOUNTER — Other Ambulatory Visit: Payer: Self-pay

## 2021-01-24 ENCOUNTER — Encounter: Payer: Self-pay | Admitting: Obstetrics and Gynecology

## 2021-01-24 VITALS — BP 138/88 | HR 91 | Ht 60.0 in | Wt 187.9 lb

## 2021-01-24 DIAGNOSIS — N921 Excessive and frequent menstruation with irregular cycle: Secondary | ICD-10-CM

## 2021-01-24 DIAGNOSIS — N83201 Unspecified ovarian cyst, right side: Secondary | ICD-10-CM | POA: Diagnosis not present

## 2021-01-24 DIAGNOSIS — N946 Dysmenorrhea, unspecified: Secondary | ICD-10-CM

## 2021-01-24 DIAGNOSIS — Z8742 Personal history of other diseases of the female genital tract: Secondary | ICD-10-CM

## 2021-01-24 DIAGNOSIS — N83202 Unspecified ovarian cyst, left side: Secondary | ICD-10-CM

## 2021-01-24 NOTE — Progress Notes (Signed)
GYNECOLOGY PROGRESS NOTE  Subjective:    Patient ID: Sandra Marsh, female    DOB: November 05, 1995, 25 y.o.   MRN: [id number]  HPI  Patient is a 25 y.o. Sandra Marsh female who presents for consultation of ovarian cyst.  She is currently a patient of Sandra Marsh, CNM.  Sandra Marsh has a history of PCOS.  She has been on OCPs (Yaz) for the past 8 months.  She has had issues with her cycle (menorhagia with irregular cycles, passage of clots, dysmenorrhea) for at least the past year. She states that the OCPs have helped with the regulation of her cycles, but still experiencing menorrhagia, and dysmenorrhea.  Had an ultrasound recently for further evaluation which noted a moderate-sized right adnexal mass. She does have a history of of an ovarian cyst in the past.    The following portions of the patient's history were reviewed and updated as appropriate: She  has a past medical history of Anxiety (07/09/2018), MDD (major depressive disorder) (09/07/2019), Ovarian cyst, and PCOS (polycystic ovarian syndrome).   She  has no past surgical history on file.   Her family history includes Hypertension in her paternal grandmother; Ovarian cancer in her mother.   She  reports that she has quit smoking. She has never used smokeless tobacco. She reports that she does not drink alcohol and does not use drugs.   Current Outpatient Medications on File Prior to Visit  Medication Sig Dispense Refill  . buPROPion (WELLBUTRIN XL) 150 MG 24 hr tablet Take 1 tablet (150 mg total) by mouth daily. 30 tablet 1  . drospirenone-ethinyl estradiol (YAZ) 3-0.02 MG tablet Take 1 tablet by mouth daily. 84 tablet 4  . hydrOXYzine (ATARAX/VISTARIL) 25 MG tablet Take 1 tablet (25 mg total) by mouth 3 (three) times daily as needed for itching or anxiety. 30 tablet 1  . lamoTRIgine (LAMICTAL) 200 MG tablet Take by mouth.     No current facility-administered medications on file prior to visit.   She is allergic to nickel..  Review of  Systems Pertinent items noted in HPI and remainder of comprehensive ROS otherwise negative.   Objective:   Blood pressure 138/88, pulse 91, height 5' (1.524 m), weight 187 lb 14.4 oz (85.2 kg), last menstrual period 01/03/2021. General appearance: alert and no distress Lungs: clear to auscultation bilaterally Heart: regular rate and rhythm, S1, S2 normal, no murmur, click, rub or gallop Abdomen: soft, non-tender; bowel sounds normal; no masses,  no organomegaly Pelvic: deferred Neurologic: Grossly normal   Labs:  Office Visit on 12/05/2020  Component Date Value Ref Range Status  . Testosterone 12/05/2020 28  13 - 71 ng/dL Final  . Testosterone, Free 12/05/2020 1.4  0.0 - 4.2 pg/mL Final  . Sex Hormone Binding 12/05/2020 153.0* 24.6 - 122.0 nmol/L Final  . Prolactin 12/05/2020 18.9  4.8 - 23.3 ng/mL Final  . TSH 12/05/2020 1.330  0.450 - 4.500 uIU/mL Final  . LH 12/05/2020 8.5  mIU/mL Final   Comment:                     Adult Female:                       Follicular phase      2.4 -  12.6                       Ovulation phase      14.0 -  95.6                       Luteal phase          1.0 -  11.4                       Postmenopausal        7.7 -  58.5   . [facility] 12/05/2020 8.8  mIU/mL Final   Comment:                     Adult Female:                       Follicular phase      3.5 -  12.5                       Ovulation phase       4.7 -  21.5                       Luteal phase          1.7 -   7.7                       Postmenopausal       25.8 - 134.8   . Progesterone 12/05/2020 0.3  ng/mL Final   Comment:                      Follicular phase       0.1 -   0.9                      Luteal phase           1.8 -  23.9                      Ovulation phase        0.1 -  12.0                      Pregnant                         First trimester    11.0 -  44.3                         Second trimester   25.4 -  83.3                         Third trimester    58.7 - 214.0                       Postmenopausal         0.0 -   0.1   . Hgb A1c MFr Bld 12/05/2020 5.2  4.8 - 5.6 % Final   Comment:          Prediabetes: 5.7 - 6.4          Diabetes: >6.4          Glycemic control for adults with diabetes: <7.0   . Est. average glucose Bld gHb Est-m* 12/05/2020 103  mg/dL Final     Imaging:  US PELVIC COMPLETE WITH TRANSVAGINAL CLINICAL DATA:  History of PCOS.  Menorrhagia.  Dysmenorrhea.  EXAM: TRANSABDOMINAL AND TRANSVAGINAL ULTRASOUND OF PELVIS  TECHNIQUE: Both transabdominal and transvaginal ultrasound examinations of the pelvis were performed. Transabdominal technique was performed for global imaging of the pelvis including uterus, ovaries, adnexal regions, and pelvic cul-de-sac. It was necessary to proceed with endovaginal exam following the transabdominal exam to visualize the endometrium and ovaries.  COMPARISON:  None  FINDINGS: Uterus  Measurements: 5.0 x 3.4 x 4.5 cm = volume: 40.7 mL. No fibroids or other mass visualized.  Endometrium  Thickness: 2 mm.  Otherwise normal.  Right ovary  Measurements: 8.0 x 5.3 x 7.1 cm = volume: 161 mL. Contains a large thick walled cyst with peripheral nodularity measuring 7.3 x 4.6 x 6.5 cm. There may be a thickened septation peripherally as well.  Left ovary  Measurements: 3.8 x 2.1 x 2.0 cm = volume: 8.0 mL. Normal appearance/no adnexal mass.  Other findings  No abnormal free fluid.  IMPRESSION: 1. The right ovary contains a 7.3 x 4.6 x 6.5 cm thick walled complex cystic mass with peripheral nodularity and possibly a peripheral thickened septation. The findings are not specific but are concerning for a neoplasm. Recommend gynecologic consultation. MRI could further assess. 2. No other abnormalities.  These results will be called to the ordering clinician or representative by the Radiologist Assistant, and communication documented in the PACS or [employer].  Electronically Signed    By: Dorise Bullion III M.D   On: 01/06/2021 21:04   Assessment:   1. Right ovarian cyst   2. History of PCOS   3. Menorrhagia with irregular cycle   4. Dysmenorrhea     Plan:   1. Discussed management options for adnexal cyst, including expectant management with increasing current OCP dose (currently on 20 mcg, can increase to 30 or 35 mcg) and using continuously for 6 weeks, vs surgical management (ovarian cystectomy, possible oophorectomy). After discussion of both options, patient opts for surgical management. The risks of surgery were discussed in detail with the patient including but not limited to: bleeding which may require transfusion or reoperation; infection which may require prolonged hospitalization or re-hospitalization and antibiotic therapy; injury to bowel, bladder, ureters and major vessels or other surrounding organs; formation of adhesions; need for additional procedures including laparotomy or subsequent procedures secondary to abnormal pathology; thromboembolic phenomenon; incisional problems and other postoperative or anesthesia complications.  Patient was told that the likelihood that her condition and symptoms will be treated effectively with this surgical management was very high; the postoperative expectations were also discussed in detail. The patient also understands the alternative treatment options which were discussed in full. All questions were answered.  She was told that she will be contacted by our surgical scheduler regarding the time and date of her surgery; routine preoperative instructions will be given to her by the preoperative nursing team.   She is aware of need for preoperative COVID testing and subsequent quarantine from time of test to time of surgery; she will be given further preoperative instructions at that [facility] screening visit.  Patient education handouts about the procedure were given to the patient to review at home.  Surgery scheduled for  02/06/2021 - Discussed further management of her menorrhagia, clotting and dysmenorrhea.  Offered option of increasing OCP dosing, or changing to different hormonal method. Patient notes that she is considering an IUD. Advised that she could consider IUD insertion at time of surgery, which she notes she would prefer.   A  total of 15 minutes were spent face-to-face with the patient during this encounter and over half of that time dealt with counseling and coordination of care.   Rubie Maid, MD [facility]

## 2021-01-24 NOTE — Patient Instructions (Signed)
Diagnostic Laparoscopy, Care After The following information offers guidance on how to care for yourself after your procedure. Your health care provider may also give you more specific instructions. If you have problems or questions, contact your health care provider. What can I expect after the procedure? After the procedure, it is common to have:  Mild discomfort in the abdomen.  Sore throat. Women who have laparoscopy with a pelvic examination may have mild cramping and fluid coming from the vagina for a few days after the procedure. Follow these instructions at home: Medicines  Take over-the-counter and prescription medicines only as told by your health care provider.  If you were prescribed an antibiotic medicine, take it as told by your health care provider. Do not stop taking the antibiotic even if you start to feel better.  Ask your health care provider if the medicine prescribed to you: ? Requires you to avoid driving or using machinery. ? Can cause constipation. You may need to take these actions to prevent or treat constipation:  Drink enough fluid to keep your urine pale yellow.  Take over-the-counter or prescription medicines.  Eat foods that are high in fiber, such as beans, whole grains, and fresh fruits and vegetables.  Limit foods that are high in fat and processed sugars, such as fried or sweet foods. Incision care  Follow instructions from your health care provider about how to take care of your incisions. Make sure you: ? Wash your hands with soap and water for at least 20 seconds before and after you change your bandage (dressing). If soap and water are not available, use hand sanitizer. ? Change your dressing as told by your health care provider. ? Leave stitches (sutures), skin glue, or surgical tape in place. These skin closures may need to stay in place for 2 weeks or longer. If surgical tape edges start to loosen and curl up, you may trim the loose edges. Do  not remove the surgical tape completely unless your health care provider tells you to do that.  Check your incision areas every day for signs of infection. Check for: ? Redness, swelling, or pain. ? Fluid or blood. ? Warmth. ? Pus or a bad smell.   Activity  Return to your normal activities as told by your health care provider. Ask your health care provider what activities are safe for you.  Do not lift anything that is heavier than 10 lb (4.5 kg), or the limit that you are told, until your health care provider says that it is safe.  Avoid sitting for a long time without moving. Get up to take short walks every 1-2 hours. This is important to improve blood flow and breathing. Ask for help if you feel weak or unsteady. General instructions  Do not use any products that contain nicotine or tobacco. These products include cigarettes, chewing tobacco, and vaping devices, such as e-cigarettes. If you need help quitting, ask your health care provider.  If you were given a sedative during the procedure, it can affect you for several hours. Do not drive or operate machinery until your health care provider says that it is safe.  Do not take baths, swim, or use a hot tub until your health care provider approves. Ask your health care provider if you may take showers. You may only be allowed to take sponge baths.  Keep all follow-up visits. This is important. Contact a health care provider if:  You develop shoulder pain.  You feel light-headed or  faint.  You are unable to pass gas or have a bowel movement.  You feel nauseous or you vomit.  You develop a rash.  You have any of these signs of infection: ? Redness, swelling, or pain around an incision. ? Fluid or blood coming from an incision. ? Warmth coming from an incision. ? Pus or a bad smell coming from an incision. ? A fever or chills. Get help right away if:  You have severe pain.  You have vomiting that does not go away.  You  have heavy bleeding from the vagina.  Any incision opens up.  You have trouble breathing.  You have chest pain. These symptoms may represent a serious problem that is an emergency. Do not wait to see if the symptoms will go away. Get medical help right away. Call your local emergency services (911 in the [place] Do not drive yourself to the hospital. Summary  After the procedure, it is common to have mild discomfort in the abdomen and a sore throat.  Check your incision areas every day for signs of infection.  Return to your normal activities as told by your health care provider. Ask your health care provider what activities are safe for you. This information is not intended to replace advice given to you by your health care provider. Make sure you discuss any questions you have with your health care provider. Document Revised: 04/15/2020 Document Reviewed: 04/15/2020 Elsevier Patient Education  2021 [employer].

## 2021-01-24 NOTE — Progress Notes (Incomplete)
    GYNECOLOGY PROGRESS NOTE  Subjective:    Patient ID: Sandra Marsh, female    DOB: [birth year], 25 y.o.   MRN: [id number]  HPI  Patient is a 25 y.o. [place] female who presents for consultation of ovarian cyst.  She is currently a patient of Sandra Marsh, CNM.  Sandra Marsh has a history of PCOS.  She has been on   {Common ambulatory SmartLinks:19316}  Review of Systems {ros; complete:30496}   Objective:   Blood pressure 138/88, pulse 91, height 5' (1.524 m), weight 187 lb 14.4 oz (85.2 kg), last menstrual period 01/03/2021. General appearance: {general exam:16600} Abdomen: {abdominal exam:16834} Pelvic: {pelvic exam:16852::"cervix normal in appearance","external genitalia normal","no adnexal masses or tenderness","no cervical motion tenderness","rectovaginal septum normal","uterus normal size, shape, and consistency","vagina normal without discharge"} Extremities: {extremity exam:5109} Neurologic: {neuro exam:17854}   Assessment:    Plan:

## 2021-01-25 NOTE — H&P (Signed)
GYNECOLOGY PREOPERATIVE HISTORY AND PHYSICAL  Subjective:  Sandra Marsh is a 25 y.o. [id number] here for surgical management of right ovarian cyst, menorrhagia, dysmenorrhea.  Currently on combined OCPs. No significant preoperative concerns.  Proposed surgery: Laparoscopic Right ovarian cystectomy (possible oophorectomy), IUD insertion   Pertinent Gynecological History: Menses: regular every month without intermenstrual spotting, with severe dysmenorrhea and flow is excessive Contraception: OCP (estrogen/progesterone) Last pap: normal Date: 04/01/2020   Past Medical History:  Diagnosis Date  . Anxiety 07/09/2018  . MDD (major depressive disorder) 09/07/2019  . Ovarian cyst   . PCOS (polycystic ovarian syndrome)     History reviewed. No pertinent surgical history.    OB History  Gravida Para Term Preterm AB Living  0 0 0 0 0 0  SAB IAB Ectopic Multiple Live Births  0 0 0 0 0    Family History  Problem Relation Age of Onset  . Hypertension Paternal Grandmother   . Ovarian cancer Mother    Social History   Socioeconomic History  . Marital status: Single    Spouse name: Not on file  . Number of children: Not on file  . Years of education: Not on file  . Highest education level: Not on file  Occupational History  . Not on file  Tobacco Use  . Smoking status: Former Research scientist (life sciences)  . Smokeless tobacco: Never Used  Vaping Use  . Vaping Use: Every day  . Substances: CBD  Substance and Sexual Activity  . Alcohol use: Never  . Drug use: Never  . Sexual activity: Yes    Birth control/protection: Pill  Other Topics Concern  . Not on file  Social History Narrative  . Not on file   Social Determinants of Health   Financial Resource Strain: Not on file  Food Insecurity: Not on file  Transportation Needs: Not on file  Physical Activity: Not on file  Stress: Not on file  Social Connections: Not on file  Intimate Partner Violence: Not on file    Current Outpatient  Medications on File Prior to Visit  Medication Sig Dispense Refill  . buPROPion (WELLBUTRIN XL) 150 MG 24 hr tablet Take 1 tablet (150 mg total) by mouth daily. 30 tablet 1  . drospirenone-ethinyl estradiol (YAZ) 3-0.02 MG tablet Take 1 tablet by mouth daily. 84 tablet 4  . hydrOXYzine (ATARAX/VISTARIL) 25 MG tablet Take 1 tablet (25 mg total) by mouth 3 (three) times daily as needed for itching or anxiety. 30 tablet 1  . lamoTRIgine (LAMICTAL) 200 MG tablet Take by mouth.     No current facility-administered medications on file prior to visit.   Allergies  Allergen Reactions  . Nickel Rash     Review of Systems Constitutional: No recent fever/chills/sweats Respiratory: No recent cough/bronchitis Cardiovascular: No chest pain Gastrointestinal: No recent nausea/vomiting/diarrhea Genitourinary: No UTI symptoms Hematologic/lymphatic:No history of coagulopathy or recent blood thinner use   Objective:   Blood pressure 138/88, pulse 91, height 5' (1.524 m), weight 187 lb 14.4 oz (85.2 kg), last menstrual period 01/03/2021. CONSTITUTIONAL: Well-developed, well-nourished female in no acute distress.  HENT:  Normocephalic, atraumatic, External right and left ear normal. Oropharynx is clear and moist EYES: Conjunctivae and EOM are normal. Pupils are equal, round, and reactive to light. No scleral icterus.  NECK: Normal range of motion, supple, no masses SKIN: Skin is warm and dry. No rash noted. Not diaphoretic. No erythema. No pallor. NEUROLOGIC: Alert and oriented to person, place, and time. Normal reflexes, muscle tone  coordination. No cranial nerve deficit noted. PSYCHIATRIC: Normal mood and affect. Normal behavior. Normal judgment and thought content. CARDIOVASCULAR: Normal heart rate noted, regular rhythm RESPIRATORY: Effort and breath sounds normal, no problems with respiration noted ABDOMEN: Soft, nontender, nondistended. PELVIC: Deferred MUSCULOSKELETAL: Normal range of motion. No  edema and no tenderness. 2+ distal pulses.    Labs: No results found for this or any previous visit (from the past 336 hour(s)).   Imaging Studies: US PELVIC COMPLETE WITH TRANSVAGINAL  Result Date: 01/06/2021 CLINICAL DATA:  History of PCOS.  Menorrhagia.  Dysmenorrhea. EXAM: TRANSABDOMINAL AND TRANSVAGINAL ULTRASOUND OF PELVIS TECHNIQUE: Both transabdominal and transvaginal ultrasound examinations of the pelvis were performed. Transabdominal technique was performed for global imaging of the pelvis including uterus, ovaries, adnexal regions, and pelvic cul-de-sac. It was necessary to proceed with endovaginal exam following the transabdominal exam to visualize the endometrium and ovaries. COMPARISON:  None FINDINGS: Uterus Measurements: 5.0 x 3.4 x 4.5 cm = volume: 40.7 mL. No fibroids or other mass visualized. Endometrium Thickness: 2 mm.  Otherwise normal. Right ovary Measurements: 8.0 x 5.3 x 7.1 cm = volume: 161 mL. Contains a large thick walled cyst with peripheral nodularity measuring 7.3 x 4.6 x 6.5 cm. There may be a thickened septation peripherally as well. Left ovary Measurements: 3.8 x 2.1 x 2.0 cm = volume: 8.0 mL. Normal appearance/no adnexal mass. Other findings No abnormal free fluid. IMPRESSION: 1. The right ovary contains a 7.3 x 4.6 x 6.5 cm thick walled complex cystic mass with peripheral nodularity and possibly a peripheral thickened septation. The findings are not specific but are concerning for a neoplasm. Recommend gynecologic consultation. MRI could further assess. 2. No other abnormalities. These results will be called to the ordering clinician or representative by the Radiologist Assistant, and communication documented in the PACS or [employer]. Electronically Signed   By: Dorise Bullion III M.D   On: 01/06/2021 21:04    Assessment:    1. Right ovarian cyst   2. History of PCOS   3. Menorrhagia with irregular cycle   4. Dysmenorrhea     Plan:   - Counseling:  Procedure, risks, reasons, benefits and complications (including injury to bowel, bladder, major blood vessel, ureter, bleeding, possibility of transfusion, infection, or fistula formation) reviewed in detail. Likelihood of success in alleviating the patient's condition was discussed. Routine postoperative instructions will be reviewed with the patient and her family in detail after surgery.  The patient concurred with the proposed plan, giving informed written consent for the surgery.   - Preop testing ordered. - Instructions reviewed, including NPO after midnight.

## 2021-01-25 NOTE — H&P (View-Only) (Signed)
GYNECOLOGY PREOPERATIVE HISTORY AND PHYSICAL  Subjective:  Sandra Marsh is a 25 y.o. G0P0000 here for surgical management of right ovarian cyst, menorrhagia, dysmenorrhea.  Currently on combined OCPs. No significant preoperative concerns.  Proposed surgery: Laparoscopic Right ovarian cystectomy (possible oophorectomy), IUD insertion   Pertinent Gynecological History: Menses: regular every month without intermenstrual spotting, with severe dysmenorrhea and flow is excessive Contraception: OCP (estrogen/progesterone) Last pap: normal Date: 04/01/2020   Past Medical History:  Diagnosis Date  . Anxiety 07/09/2018  . MDD (major depressive disorder) 09/07/2019  . Ovarian cyst   . PCOS (polycystic ovarian syndrome)     History reviewed. No pertinent surgical history.    OB History  Gravida Para Term Preterm AB Living  0 0 0 0 0 0  SAB IAB Ectopic Multiple Live Births  0 0 0 0 0    Family History  Problem Relation Age of Onset  . Hypertension Paternal Grandmother   . Ovarian cancer Mother    Social History   Socioeconomic History  . Marital status: Single    Spouse name: Not on file  . Number of children: Not on file  . Years of education: Not on file  . Highest education level: Not on file  Occupational History  . Not on file  Tobacco Use  . Smoking status: Former Research scientist (life sciences)  . Smokeless tobacco: Never Used  Vaping Use  . Vaping Use: Every day  . Substances: CBD  Substance and Sexual Activity  . Alcohol use: Never  . Drug use: Never  . Sexual activity: Yes    Birth control/protection: Pill  Other Topics Concern  . Not on file  Social History Narrative  . Not on file   Social Determinants of Health   Financial Resource Strain: Not on file  Food Insecurity: Not on file  Transportation Needs: Not on file  Physical Activity: Not on file  Stress: Not on file  Social Connections: Not on file  Intimate Partner Violence: Not on file    Current Outpatient  Medications on File Prior to Visit  Medication Sig Dispense Refill  . buPROPion (WELLBUTRIN XL) 150 MG 24 hr tablet Take 1 tablet (150 mg total) by mouth daily. 30 tablet 1  . drospirenone-ethinyl estradiol (YAZ) 3-0.02 MG tablet Take 1 tablet by mouth daily. 84 tablet 4  . hydrOXYzine (ATARAX/VISTARIL) 25 MG tablet Take 1 tablet (25 mg total) by mouth 3 (three) times daily as needed for itching or anxiety. 30 tablet 1  . lamoTRIgine (LAMICTAL) 200 MG tablet Take by mouth.     No current facility-administered medications on file prior to visit.   Allergies  Allergen Reactions  . Nickel Rash     Review of Systems Constitutional: No recent fever/chills/sweats Respiratory: No recent cough/bronchitis Cardiovascular: No chest pain Gastrointestinal: No recent nausea/vomiting/diarrhea Genitourinary: No UTI symptoms Hematologic/lymphatic:No history of coagulopathy or recent blood thinner use   Objective:   Blood pressure 138/88, pulse 91, height 5' (1.524 m), weight 187 lb 14.4 oz (85.2 kg), last menstrual period 01/03/2021. CONSTITUTIONAL: Well-developed, well-nourished female in no acute distress.  HENT:  Normocephalic, atraumatic, External right and left ear normal. Oropharynx is clear and moist EYES: Conjunctivae and EOM are normal. Pupils are equal, round, and reactive to light. No scleral icterus.  NECK: Normal range of motion, supple, no masses SKIN: Skin is warm and dry. No rash noted. Not diaphoretic. No erythema. No pallor. NEUROLOGIC: Alert and oriented to person, place, and time. Normal reflexes, muscle tone  coordination. No cranial nerve deficit noted. PSYCHIATRIC: Normal mood and affect. Normal behavior. Normal judgment and thought content. CARDIOVASCULAR: Normal heart rate noted, regular rhythm RESPIRATORY: Effort and breath sounds normal, no problems with respiration noted ABDOMEN: Soft, nontender, nondistended. PELVIC: Deferred MUSCULOSKELETAL: Normal range of motion. No  edema and no tenderness. 2+ distal pulses.    Labs: No results found for this or any previous visit (from the past 336 hour(s)).   Imaging Studies: US PELVIC COMPLETE WITH TRANSVAGINAL  Result Date: 01/06/2021 CLINICAL DATA:  History of PCOS.  Menorrhagia.  Dysmenorrhea. EXAM: TRANSABDOMINAL AND TRANSVAGINAL ULTRASOUND OF PELVIS TECHNIQUE: Both transabdominal and transvaginal ultrasound examinations of the pelvis were performed. Transabdominal technique was performed for global imaging of the pelvis including uterus, ovaries, adnexal regions, and pelvic cul-de-sac. It was necessary to proceed with endovaginal exam following the transabdominal exam to visualize the endometrium and ovaries. COMPARISON:  None FINDINGS: Uterus Measurements: 5.0 x 3.4 x 4.5 cm = volume: 40.7 mL. No fibroids or other mass visualized. Endometrium Thickness: 2 mm.  Otherwise normal. Right ovary Measurements: 8.0 x 5.3 x 7.1 cm = volume: 161 mL. Contains a large thick walled cyst with peripheral nodularity measuring 7.3 x 4.6 x 6.5 cm. There may be a thickened septation peripherally as well. Left ovary Measurements: 3.8 x 2.1 x 2.0 cm = volume: 8.0 mL. Normal appearance/no adnexal mass. Other findings No abnormal free fluid. IMPRESSION: 1. The right ovary contains a 7.3 x 4.6 x 6.5 cm thick walled complex cystic mass with peripheral nodularity and possibly a peripheral thickened septation. The findings are not specific but are concerning for a neoplasm. Recommend gynecologic consultation. MRI could further assess. 2. No other abnormalities. These results will be called to the ordering clinician or representative by the Radiologist Assistant, and communication documented in the PACS or [employer]. Electronically Signed   By: Dorise Bullion III M.D   On: 01/06/2021 21:04    Assessment:    1. Right ovarian cyst   2. History of PCOS   3. Menorrhagia with irregular cycle   4. Dysmenorrhea     Plan:   - Counseling:  Procedure, risks, reasons, benefits and complications (including injury to bowel, bladder, major blood vessel, ureter, bleeding, possibility of transfusion, infection, or fistula formation) reviewed in detail. Likelihood of success in alleviating the patient's condition was discussed. Routine postoperative instructions will be reviewed with the patient and her family in detail after surgery.  The patient concurred with the proposed plan, giving informed written consent for the surgery.   - Preop testing ordered. - Instructions reviewed, including NPO after midnight.

## 2021-02-02 ENCOUNTER — Other Ambulatory Visit: Payer: Self-pay

## 2021-02-02 ENCOUNTER — Other Ambulatory Visit
Admission: RE | Admit: 2021-02-02 | Discharge: 2021-02-02 | Disposition: A | Discharge status: Home / Self Care | Payer: BC Managed Care – PPO | Source: Ambulatory Visit | Attending: Obstetrics and Gynecology | Admitting: Obstetrics and Gynecology

## 2021-02-02 ENCOUNTER — Encounter
Admission: RE | Admit: 2021-02-02 | Discharge: 2021-02-02 | Disposition: A | Discharge status: Home / Self Care | Payer: BC Managed Care – PPO | Source: Ambulatory Visit | Attending: Obstetrics and Gynecology | Admitting: Obstetrics and Gynecology

## 2021-02-02 ENCOUNTER — Other Ambulatory Visit: Payer: BC Managed Care – PPO

## 2021-02-02 DIAGNOSIS — Z01812 Encounter for preprocedural laboratory examination: Secondary | ICD-10-CM | POA: Insufficient documentation

## 2021-02-02 DIAGNOSIS — Z20822 Contact with and (suspected) exposure to covid-19: Secondary | ICD-10-CM | POA: Diagnosis not present

## 2021-02-02 LAB — TYPE AND SCREEN
ABO/RH(D): O POS
Antibody Screen: NEGATIVE

## 2021-02-02 LAB — CBC
HCT: 36.5 % (ref 36.0–46.0)
Hemoglobin: 12.4 g/dL (ref 12.0–15.0)
MCH: 29.4 pg (ref 26.0–34.0)
MCHC: 34 g/dL (ref 30.0–36.0)
MCV: 86.5 fL (ref 80.0–100.0)
Platelets: 323 10*3/uL (ref 150–400)
RBC: 4.22 MIL/uL (ref 3.87–5.11)
RDW: 12.8 % (ref 11.5–15.5)
WBC: 11.8 10*3/uL — ABNORMAL HIGH (ref 4.0–10.5)
nRBC: 0 % (ref 0.0–0.2)

## 2021-02-02 NOTE — Patient Instructions (Signed)
Your procedure is scheduled on: 02/06/21 Report to [facility]. To find out your arrival time please call [phone] between 1PM - 3PM on 02/03/21.  Remember: Instructions that are not followed completely may result in serious medical risk, up to and including death, or upon the discretion of your surgeon and anesthesiologist your surgery may need to be rescheduled.     _X__ 1. Do not eat food or drink liquids after midnight the night before your procedure.                 No gum chewing or hard candies. You may have sips of water with your medications  __X__2.  On the morning of surgery brush your teeth with toothpaste and water, you                 may rinse your mouth with mouthwash if you wish.  Do not swallow any              toothpaste of mouthwash.     _X__ 3.  No Alcohol for 24 hours before or after surgery.   _X__ 4.  Do Not Smoke or use e-cigarettes For 24 Hours Prior to Your Surgery.                 Do not use any chewable tobacco products for at least 6 hours prior to                 surgery.  ____  5.  Bring all medications with you on the day of surgery if instructed.   __X__  6.  Notify your doctor if there is any change in your medical condition      (cold, fever, infections).     Do not wear jewelry, make-up, hairpins, clips or nail polish. Do not wear lotions, powders, or perfumes.  Do not shave 48 hours prior to surgery. Men may shave face and neck. Do not bring valuables to the hospital.    [facility] is not responsible for any belongings or valuables.  Contacts, dentures/partials or body piercings may not be worn into surgery. Bring a case for your contacts, glasses or hearing aids, a denture cup will be supplied. Leave your suitcase in the car. After surgery it may be brought to your room. For patients admitted to the hospital, discharge time is determined by your treatment team.   Patients discharged the  day of surgery will not be allowed to drive home.   Please read over the following fact sheets that you were given:   CHG soap  __X__ Take these medicines the morning of surgery with A SIP OF WATER:    1. buPROPion (WELLBUTRIN SR) 150 MG 12 hr tablet  2. lamoTRIgine (LAMICTAL) 200 MG tablet  3. hydrOXYzine (ATARAX/VISTARIL) 25 MG tablet if needed for anxiety  4.  5.  6.  ____ Fleet Enema (as directed)   __X__ Use CHG Soap/SAGE wipes as directed  ____ Use inhalers on the day of surgery  ____ Stop metformin/Janumet/Farxiga 2 days prior to surgery    ____ Take 1/2 of usual insulin dose the night before surgery. No insulin the morning          of surgery.   ____ Stop Blood Thinners Coumadin/Plavix/Xarelto/Pleta/Pradaxa/Eliquis/Effient/Aspirin  on   Or contact your Surgeon, Cardiologist or Medical Doctor regarding  ability to stop your blood thinners  __X__ Stop Anti-inflammatories 7 days before surgery such  as Advil, Ibuprofen, Motrin,  BC or Goodies Powder, Naprosyn, Naproxen, Aleve, Aspirin    __X__ Stop all herbal supplements, fish oil or vitamin E until after surgery.    ____ Bring C-Pap to the hospital.

## 2021-02-03 ENCOUNTER — Other Ambulatory Visit: Admission: RE | Admit: 2021-02-03 | Payer: BC Managed Care – PPO | Source: Ambulatory Visit

## 2021-02-03 LAB — SARS CORONAVIRUS 2 (TAT 6-24 HRS): SARS Coronavirus 2: NEGATIVE

## 2021-02-06 ENCOUNTER — Encounter
Admission: RE | Disposition: A | Discharge status: Home / Self Care | Payer: Self-pay | Source: Home / Self Care | Attending: Obstetrics and Gynecology

## 2021-02-06 ENCOUNTER — Ambulatory Visit
Admission: RE | Admit: 2021-02-06 | Discharge: 2021-02-06 | Disposition: A | Discharge status: Home / Self Care | Payer: BC Managed Care – PPO | Attending: Obstetrics and Gynecology | Admitting: Obstetrics and Gynecology

## 2021-02-06 ENCOUNTER — Ambulatory Visit: Payer: BC Managed Care – PPO | Admitting: Anesthesiology

## 2021-02-06 ENCOUNTER — Encounter: Payer: Self-pay | Admitting: Obstetrics and Gynecology

## 2021-02-06 DIAGNOSIS — Z79899 Other long term (current) drug therapy: Secondary | ICD-10-CM | POA: Diagnosis not present

## 2021-02-06 DIAGNOSIS — N920 Excessive and frequent menstruation with regular cycle: Secondary | ICD-10-CM | POA: Insufficient documentation

## 2021-02-06 DIAGNOSIS — Z8041 Family history of malignant neoplasm of ovary: Secondary | ICD-10-CM | POA: Diagnosis not present

## 2021-02-06 DIAGNOSIS — N83201 Unspecified ovarian cyst, right side: Secondary | ICD-10-CM | POA: Insufficient documentation

## 2021-02-06 DIAGNOSIS — Z9889 Other specified postprocedural states: Secondary | ICD-10-CM

## 2021-02-06 DIAGNOSIS — Z8742 Personal history of other diseases of the female genital tract: Secondary | ICD-10-CM | POA: Diagnosis not present

## 2021-02-06 DIAGNOSIS — N921 Excessive and frequent menstruation with irregular cycle: Secondary | ICD-10-CM | POA: Diagnosis not present

## 2021-02-06 DIAGNOSIS — Z30430 Encounter for insertion of intrauterine contraceptive device: Secondary | ICD-10-CM

## 2021-02-06 DIAGNOSIS — Z87891 Personal history of nicotine dependence: Secondary | ICD-10-CM | POA: Insufficient documentation

## 2021-02-06 DIAGNOSIS — N946 Dysmenorrhea, unspecified: Secondary | ICD-10-CM | POA: Diagnosis not present

## 2021-02-06 HISTORY — PX: LAPAROSCOPIC OVARIAN CYSTECTOMY: SHX6248

## 2021-02-06 HISTORY — PX: INTRAUTERINE DEVICE (IUD) INSERTION: SHX5877

## 2021-02-06 LAB — POCT PREGNANCY, URINE: Preg Test, Ur: NEGATIVE

## 2021-02-06 LAB — URINE DRUG SCREEN, QUALITATIVE (ARMC ONLY)
Amphetamines, Ur Screen: NOT DETECTED
Barbiturates, Ur Screen: NOT DETECTED
Benzodiazepine, Ur Scrn: NOT DETECTED
Cannabinoid 50 Ng, Ur ~~LOC~~: POSITIVE — AB
Cocaine Metabolite,Ur ~~LOC~~: NOT DETECTED
MDMA (Ecstasy)Ur Screen: NOT DETECTED
Methadone Scn, Ur: NOT DETECTED
Opiate, Ur Screen: NOT DETECTED
Phencyclidine (PCP) Ur S: NOT DETECTED
Tricyclic, Ur Screen: NOT DETECTED

## 2021-02-06 LAB — ABO/RH: ABO/RH(D): O POS

## 2021-02-06 SURGERY — EXCISION, CYST, OVARY, LAPAROSCOPIC
Anesthesia: General | Laterality: Right

## 2021-02-06 MED ORDER — FENTANYL CITRATE (PF) 100 MCG/2ML IJ SOLN
INTRAMUSCULAR | Status: AC
  Filled 2021-02-06: qty 2

## 2021-02-06 MED ORDER — DEXAMETHASONE SODIUM PHOSPHATE 10 MG/ML IJ SOLN
INTRAMUSCULAR | Status: AC
  Filled 2021-02-06: qty 1

## 2021-02-06 MED ORDER — ROCURONIUM BROMIDE 100 MG/10ML IV SOLN
INTRAVENOUS | Status: DC | PRN
  Administered 2021-02-06: 50 mg via INTRAVENOUS

## 2021-02-06 MED ORDER — PROPOFOL 10 MG/ML IV BOLUS
INTRAVENOUS | Status: DC | PRN
  Administered 2021-02-06: 50 mg via INTRAVENOUS
  Administered 2021-02-06: 150 mg via INTRAVENOUS

## 2021-02-06 MED ORDER — MIDAZOLAM HCL 2 MG/2ML IJ SOLN
INTRAMUSCULAR | Status: AC
  Filled 2021-02-06: qty 2

## 2021-02-06 MED ORDER — FENTANYL CITRATE (PF) 100 MCG/2ML IJ SOLN
25.0000 ug | INTRAMUSCULAR | Status: DC | PRN
  Administered 2021-02-06 (×3): 25 ug via INTRAVENOUS

## 2021-02-06 MED ORDER — IBUPROFEN 800 MG PO TABS
800.0000 mg | ORAL_TABLET | Freq: Three times a day (TID) | ORAL | 0 refills | Status: DC | PRN
Start: 2021-02-06 — End: 2022-02-18

## 2021-02-06 MED ORDER — ACETAMINOPHEN 500 MG PO TABS
ORAL_TABLET | ORAL | Status: AC
  Administered 2021-02-06: 1000 mg via ORAL
  Filled 2021-02-06: qty 2

## 2021-02-06 MED ORDER — BUPIVACAINE HCL (PF) 0.5 % IJ SOLN
INTRAMUSCULAR | Status: AC
  Filled 2021-02-06: qty 30

## 2021-02-06 MED ORDER — POVIDONE-IODINE 10 % EX SWAB: 2.0000 "application " | Freq: Once | CUTANEOUS | Status: DC

## 2021-02-06 MED ORDER — KETAMINE HCL 50 MG/ML IJ SOLN
INTRAMUSCULAR | Status: DC | PRN
  Administered 2021-02-06: 50 mg via INTRAMUSCULAR

## 2021-02-06 MED ORDER — LEVONORGESTREL 20 MCG/DAY IU IUD
1.0000 | INTRAUTERINE_SYSTEM | INTRAUTERINE | Status: AC
  Administered 2021-02-06: 1 via INTRAUTERINE
  Filled 2021-02-06: qty 1

## 2021-02-06 MED ORDER — CHLORHEXIDINE GLUCONATE 0.12 % MT SOLN
OROMUCOSAL | Status: AC
  Administered 2021-02-06: 15 mL via OROMUCOSAL
  Filled 2021-02-06: qty 15

## 2021-02-06 MED ORDER — DEXAMETHASONE SODIUM PHOSPHATE 10 MG/ML IJ SOLN
INTRAMUSCULAR | Status: DC | PRN
  Administered 2021-02-06: 10 mg via INTRAVENOUS

## 2021-02-06 MED ORDER — FENTANYL CITRATE (PF) 100 MCG/2ML IJ SOLN
INTRAMUSCULAR | Status: AC
  Administered 2021-02-06: 25 ug via INTRAVENOUS
  Filled 2021-02-06: qty 2

## 2021-02-06 MED ORDER — SIMETHICONE 80 MG PO CHEW: 80.0000 mg | CHEWABLE_TABLET | Freq: Four times a day (QID) | ORAL | 1 refills | Status: DC | PRN

## 2021-02-06 MED ORDER — ONDANSETRON HCL 4 MG/2ML IJ SOLN
INTRAMUSCULAR | Status: AC
  Filled 2021-02-06: qty 2

## 2021-02-06 MED ORDER — GABAPENTIN 300 MG PO CAPS: 300.0000 mg | ORAL_CAPSULE | ORAL | Status: AC

## 2021-02-06 MED ORDER — BUPIVACAINE HCL 0.5 % IJ SOLN
INTRAMUSCULAR | Status: DC | PRN
  Administered 2021-02-06: 21 mL

## 2021-02-06 MED ORDER — GABAPENTIN 300 MG PO CAPS
ORAL_CAPSULE | ORAL | Status: AC
  Administered 2021-02-06: 300 mg via ORAL
  Filled 2021-02-06: qty 1

## 2021-02-06 MED ORDER — ONDANSETRON HCL 4 MG/2ML IJ SOLN
4.0000 mg | Freq: Once | INTRAMUSCULAR | Status: DC | PRN
Start: 2021-02-06 — End: 2021-02-06

## 2021-02-06 MED ORDER — ONDANSETRON HCL 4 MG/2ML IJ SOLN
INTRAMUSCULAR | Status: DC | PRN
  Administered 2021-02-06: 4 mg via INTRAVENOUS

## 2021-02-06 MED ORDER — FENTANYL CITRATE (PF) 100 MCG/2ML IJ SOLN
INTRAMUSCULAR | Status: DC | PRN
  Administered 2021-02-06 (×2): 50 ug via INTRAVENOUS

## 2021-02-06 MED ORDER — MIDAZOLAM HCL 2 MG/2ML IJ SOLN
INTRAMUSCULAR | Status: DC | PRN
  Administered 2021-02-06: 2 mg via INTRAVENOUS

## 2021-02-06 MED ORDER — FAMOTIDINE 20 MG PO TABS
ORAL_TABLET | ORAL | Status: AC
  Administered 2021-02-06: 20 mg via ORAL
  Filled 2021-02-06: qty 1

## 2021-02-06 MED ORDER — LACTATED RINGERS IV SOLN: INTRAVENOUS | Status: DC

## 2021-02-06 MED ORDER — ORAL CARE MOUTH RINSE: 15.0000 mL | Freq: Once | OROMUCOSAL | Status: AC

## 2021-02-06 MED ORDER — OXYCODONE-ACETAMINOPHEN 5-325 MG PO TABS: 1.0000 | ORAL_TABLET | Freq: Four times a day (QID) | ORAL | 0 refills | Status: DC | PRN

## 2021-02-06 MED ORDER — FAMOTIDINE 20 MG PO TABS: 20.0000 mg | ORAL_TABLET | Freq: Once | ORAL | Status: AC

## 2021-02-06 MED ORDER — SUGAMMADEX SODIUM 200 MG/2ML IV SOLN
INTRAVENOUS | Status: DC | PRN
  Administered 2021-02-06: 200 mg via INTRAVENOUS

## 2021-02-06 MED ORDER — LIDOCAINE HCL (PF) 2 % IJ SOLN
INTRAMUSCULAR | Status: AC
  Filled 2021-02-06: qty 5

## 2021-02-06 MED ORDER — PHENYLEPHRINE HCL (PRESSORS) 10 MG/ML IV SOLN
INTRAVENOUS | Status: DC | PRN
  Administered 2021-02-06: 100 ug via INTRAVENOUS

## 2021-02-06 MED ORDER — DOCUSATE SODIUM 100 MG PO CAPS: 100.0000 mg | ORAL_CAPSULE | Freq: Two times a day (BID) | ORAL | 1 refills | Status: DC | PRN

## 2021-02-06 MED ORDER — PROPOFOL 10 MG/ML IV BOLUS
INTRAVENOUS | Status: AC
  Filled 2021-02-06: qty 20

## 2021-02-06 MED ORDER — CHLORHEXIDINE GLUCONATE 0.12 % MT SOLN: 15.0000 mL | Freq: Once | OROMUCOSAL | Status: AC

## 2021-02-06 MED ORDER — ACETAMINOPHEN 500 MG PO TABS: 1000.0000 mg | ORAL_TABLET | ORAL | Status: AC

## 2021-02-06 MED ORDER — LIDOCAINE HCL (CARDIAC) PF 100 MG/5ML IV SOSY
PREFILLED_SYRINGE | INTRAVENOUS | Status: DC | PRN
  Administered 2021-02-06: 80 mg via INTRAVENOUS

## 2021-02-06 SURGICAL SUPPLY — 50 items
ADH SKN CLS APL DERMABOND .7 (GAUZE/BANDAGES/DRESSINGS) ×2
APL PRP STRL LF DISP 70% ISPRP (MISCELLANEOUS) ×2
BAG SPEC RTRVL LRG 6X4 10 (ENDOMECHANICALS)
BLADE SURG SZ11 CARB STEEL (BLADE) ×3 IMPLANT
CANISTER SUCT 1200ML W/VALVE (MISCELLANEOUS) IMPLANT
CATH ROBINSON RED A/P 16FR (CATHETERS) ×3 IMPLANT
CHLORAPREP W/TINT 26 (MISCELLANEOUS) ×3 IMPLANT
CORD MONOPOLAR M/FML 12FT (MISCELLANEOUS) IMPLANT
COVER WAND RF STERILE (DRAPES) ×3 IMPLANT
CUP MEDICINE 2OZ PLAST GRAD ST (MISCELLANEOUS) ×3 IMPLANT
DERMABOND ADVANCED (GAUZE/BANDAGES/DRESSINGS) ×1
DERMABOND ADVANCED .7 DNX12 (GAUZE/BANDAGES/DRESSINGS) ×2 IMPLANT
DRAPE UNDER BUTTOCK W/FLU (DRAPES) ×3 IMPLANT
DRSG TELFA 3X8 NADH (GAUZE/BANDAGES/DRESSINGS) ×3 IMPLANT
GAUZE 4X4 16PLY RFD (DISPOSABLE) ×3 IMPLANT
GLOVE SURG ENC MOIS LTX SZ6.5 (GLOVE) ×3 IMPLANT
GLOVE SURG ENC MOIS LTX SZ8 (GLOVE) ×3 IMPLANT
GLOVE SURG UNDER LTX SZ7 (GLOVE) ×3 IMPLANT
GOWN STRL REUS W/ TWL LRG LVL3 (GOWN DISPOSABLE) ×4 IMPLANT
GOWN STRL REUS W/TWL LRG LVL3 (GOWN DISPOSABLE) ×6
GOWN STRL REUS W/TWL XL LVL4 (GOWN DISPOSABLE) ×3 IMPLANT
GRASPER SUT TROCAR 14GX15 (MISCELLANEOUS) ×3 IMPLANT
IRRIGATION STRYKERFLOW (MISCELLANEOUS) IMPLANT
IRRIGATOR STRYKERFLOW (MISCELLANEOUS)
IV LACTATED RINGERS 1000ML (IV SOLUTION) IMPLANT
KIT PINK PAD W/HEAD ARE REST (MISCELLANEOUS) ×3
KIT PINK PAD W/HEAD ARM REST (MISCELLANEOUS) ×2 IMPLANT
KIT TURNOVER CYSTO (KITS) ×3 IMPLANT
MANIFOLD NEPTUNE II (INSTRUMENTS) ×3 IMPLANT
Mirena ×3 IMPLANT
NS IRRIG 500ML POUR BTL (IV SOLUTION) ×3 IMPLANT
PACK DNC HYST (MISCELLANEOUS) ×3 IMPLANT
PACK GYN LAPAROSCOPIC (MISCELLANEOUS) ×3 IMPLANT
PAD OB MATERNITY 4.3X12.25 (PERSONAL CARE ITEMS) ×3 IMPLANT
PAD PREP 24X41 OB/GYN DISP (PERSONAL CARE ITEMS) ×3 IMPLANT
POUCH ENDO CATCH 10MM SPEC (MISCELLANEOUS) IMPLANT
POUCH SPECIMEN RETRIEVAL 10MM (ENDOMECHANICALS) IMPLANT
SCISSORS METZENBAUM CVD 33 (INSTRUMENTS) IMPLANT
SET TUBE SMOKE EVAC HIGH FLOW (TUBING) ×3 IMPLANT
SHEARS HARMONIC ACE PLUS 36CM (ENDOMECHANICALS) ×3 IMPLANT
SLEEVE ENDOPATH XCEL 5M (ENDOMECHANICALS) ×3 IMPLANT
SOL PREP PVP 2OZ (MISCELLANEOUS)
SOLUTION PREP PVP 2OZ (MISCELLANEOUS) IMPLANT
SUT VIC AB 3-0 SH 27 (SUTURE)
SUT VIC AB 3-0 SH 27X BRD (SUTURE) IMPLANT
SUT VICRYL 0 AB UR-6 (SUTURE) ×3 IMPLANT
TOWEL OR 17X26 4PK STRL BLUE (TOWEL DISPOSABLE) ×3 IMPLANT
TROCAR ENDO BLADELESS 11MM (ENDOMECHANICALS) ×3 IMPLANT
TROCAR XCEL NON-BLD 5MMX100MML (ENDOMECHANICALS) ×3 IMPLANT
TROCAR XCEL UNIV SLVE 11M 100M (ENDOMECHANICALS) IMPLANT

## 2021-02-06 NOTE — Op Note (Signed)
Procedure(s): LAPAROSCOPIC OVARIAN CYSTECTOMY INTRAUTERINE DEVICE (IUD) INSERTION Procedure Note  Sandra Marsh female 25 y.o. 02/06/2021  Indications: The patient is a 25 y.o. G0P0000 female with persistent right ovarian cyst (despite continuous OCP use), dysmenorrhea, menorrhagia.   Pre-operative Diagnosis: Persistent right ovarian cyst, dysmenorrhea, menorrhagia  Post-operative Diagnosis:  Same  Surgeon: Rubie Maid, MD  Assistants:  Jeannie Fend, MD. An experienced assistant was required given the standard of surgical care given the complexity of the case.  This assistant was needed for exposure, dissection, suctioning, retraction, instrument exchange, and for overall help during the procedure.  Anesthesia: General endotracheal anesthesia   Findings: The uterus was sounded to 7.5 cm.  Fallopian tubes and left ovary appeared normal.  The right ovary was noted to have a moderate sized ovarian cyst.  Serous fluid aspirated, ~ 150 ml. Powder burn lesion noted on right ovary.  Bilateral ovarian fossa, posterior cul-de-sac, and upper abdomen appeared normal.    Procedure Details: The patient was seen in the Holding Room. The risks, benefits, complications, treatment options, and expected outcomes were discussed with the patient.  The patient concurred with the proposed plan, giving informed consent.  The site of surgery properly noted/marked. The patient was taken to the Operating Room, identified as Sandra Marsh and the procedure verified as Procedure(s) (LRB): LAPAROSCOPIC OVARIAN CYSTECTOMY (Right), INTRAUTERINE DEVICE (IUD) INSERTION (N/A). A Time Out was held and the above information confirmed.  She was then placed under general anesthesia without difficulty. She was placed in the dorsal lithotomy position, and was prepped and draped in a sterile manner.  A straight catheterization was performed. A sterile speculum was inserted into the vagina and the cervix was grasped at the  anterior lip using a single-toothed tenaculum.  The uterus was sounded to 7.5 cm, and a Hulka clamp was placed for uterine manipulation.  The speculum and tenaculum were then removed. After an adequate timeout was performed, attention was turned to the abdomen where an umbilical incision was made with the scalpel.  The Optiview 5-mm trocar and sleeve were then advanced without difficulty with the laparoscope under direct visualization into the abdomen.  The abdomen was then insufflated with carbon dioxide gas and adequate pneumoperitoneum was obtained. A 5-mm left lower quadrant port and an 11-mm right lower quadrant port were then placed under direct visualization.  A survey of the patient's pelvis and abdomen revealed the findings as above.  A cyst aspirator was used to drain the ovarian cyst, with ~ 150 ml of bubbly serous fluid.  There was no separate cyst wall identified  And so no cystectomy was performed.  There was an area with lesion on the ovary that had the appearance of a powder-burn lesion. This area was biopsied.  The Harmonic device was used for hemostasis.  A small adhesion of the ovary to the omentum was lysed with the Harmonic Scalpel.    A final survey was performed, where good hemostasis was noted throughout.  The 11 mm trochar was removed, and the fascial incision was closed with a PMI device.  All other trocars were removed under direct visualization, and the abdomen which was desufflated.    All skin incisions were closed with 4-0 Monocryl subcuticular stitches. Dermabond was placed over the incisions.  The incisions were injected with a total of 20 ml of 0.5% Sensorcaine.   Attention was then turned to the pelvis.  The speculum was placed in the vagina.  The Hulka clamp was removed and a  single-toothed tenaculum was used to grasp the anterior lip of the cervix. The Mirena IUD was placed per manufacturer's recommendations.  Strings were trimmed to 3 cm. The tenaculum was removed, with  good hemostasis noted.    The patient tolerated the procedures well.  All instruments, needles, and sponge counts were correct x 2. The patient was taken to the recovery room awake, extubated and in stable condition.    Estimated Blood Loss:  Minimal (<5 ml)      Drains: straight catheterization prior to procedure with  30 ml of clear urine         Total IV Fluids: 1000 ml  Specimens: Right ovarian cyst biopsy.          Implants: Mirena IUD.  Lot #: [id number].  Exp: 12/2022.         Complications:  None; patient tolerated the procedure well.         Disposition: PACU - hemodynamically stable.         Condition: stable   Rubie Maid, MD [facility]

## 2021-02-06 NOTE — Anesthesia Procedure Notes (Signed)
Procedure Name: Intubation Date/Time: 02/06/2021 10:48 AM Performed by: Debe Coder, CRNA Pre-anesthesia Checklist: Patient identified, Emergency Drugs available, Suction available and Patient being monitored Patient Re-evaluated:Patient Re-evaluated prior to induction Oxygen Delivery Method: Circle system utilized Preoxygenation: Pre-oxygenation with 100% oxygen Induction Type: IV induction Ventilation: Mask ventilation without difficulty Laryngoscope Size: Mac and 4 Grade View: Grade I Tube type: Oral Tube size: 6.5 mm Number of attempts: 1 Airway Equipment and Method: Stylet Placement Confirmation: ETT inserted through vocal cords under direct vision,  positive ETCO2 and breath sounds checked- equal and bilateral Secured at: 19 cm Tube secured with: Tape Dental Injury: Teeth and Oropharynx as per pre-operative assessment

## 2021-02-06 NOTE — Transfer of Care (Signed)
Immediate Anesthesia Transfer of Care Note  Patient: Sandra Marsh  Procedure(s) Performed: LAPAROSCOPIC OVARIAN CYSTECTOMY (Right ) INTRAUTERINE DEVICE (IUD) INSERTION (N/A )  Patient Location: PACU  Anesthesia Type:General  Level of Consciousness: awake, drowsy and patient cooperative  Airway & Oxygen Therapy: Patient Spontanous Breathing  Post-op Assessment: Report given to RN and Post -op Vital signs reviewed and stable  Post vital signs: Reviewed and stable  Last Vitals:  Vitals Value Taken Time  BP 123/100 02/06/21 1230  Temp    Pulse 93 02/06/21 1236  Resp 19 02/06/21 1236  SpO2 100 % 02/06/21 1236  Vitals shown include unvalidated device data.  Last Pain:  Vitals:   02/06/21 0923  TempSrc: Temporal  PainSc: 0-No pain         Complications: No complications documented.

## 2021-02-06 NOTE — Interval H&P Note (Signed)
History and Physical Interval Note:  02/06/2021 10:28 AM  Sandra Marsh  has presented today for surgery, with the diagnosis of Right Ovarian Cyat, Menorrhagia,Dysmenorrhea.  The various methods of treatment have been discussed with the patient and family. After consideration of risks, benefits and other options for treatment, the patient has consented to  Procedure(s): LAPAROSCOPIC OVARIAN CYSTECTOMY (Right) LAPAROSCOPIC OOPHORECTOMY (RIGHT) - POSSIBLE  INTRAUTERINE DEVICE (IUD) INSERTION (N/A) as a surgical intervention.  The patient's history has been reviewed, patient examined, no change in status, stable for surgery.  I have reviewed the patient's chart and labs.  Questions were answered to the patient's satisfaction.     Rubie Maid, MD

## 2021-02-06 NOTE — Anesthesia Preprocedure Evaluation (Signed)
Anesthesia Evaluation  Patient identified by MRN, date of birth, ID band Patient awake    Reviewed: Allergy & Precautions, NPO status , Patient's Chart, lab work & pertinent test results  History of Anesthesia Complications Negative for: history of anesthetic complications  Airway Mallampati: II       Dental   Pulmonary neg sleep apnea, neg COPD, Not current smoker, former smoker,           Cardiovascular (-) hypertension(-) Past MI and (-) CHF (-) dysrhythmias (-) Valvular Problems/Murmurs     Neuro/Psych neg Seizures Anxiety Depression    GI/Hepatic neg GERD  ,  Endo/Other  neg diabetes  Renal/GU negative Renal ROS     Musculoskeletal   Abdominal   Peds  Hematology   Anesthesia Other Findings   Reproductive/Obstetrics                             Anesthesia Physical Anesthesia Plan  ASA: II  Anesthesia Plan: General   Post-op Pain Management:    Induction: Intravenous  PONV Risk Score and Plan: 3 and Ondansetron and Dexamethasone  Airway Management Planned: Oral ETT  Additional Equipment:   Intra-op Plan:   Post-operative Plan:   Informed Consent: I have reviewed the patients History and Physical, chart, labs and discussed the procedure including the risks, benefits and alternatives for the proposed anesthesia with the patient or authorized representative who has indicated his/her understanding and acceptance.       Plan Discussed with:   Anesthesia Plan Comments:         Anesthesia Quick Evaluation

## 2021-02-06 NOTE — Anesthesia Postprocedure Evaluation (Signed)
Anesthesia Post Note  Patient: Sandra Marsh  Procedure(s) Performed: LAPAROSCOPIC OVARIAN CYSTECTOMY (Right ) INTRAUTERINE DEVICE (IUD) INSERTION (N/A )  Patient location during evaluation: PACU Anesthesia Type: General Level of consciousness: awake and alert Pain management: pain level controlled Vital Signs Assessment: post-procedure vital signs reviewed and stable Respiratory status: spontaneous breathing and respiratory function stable Cardiovascular status: stable Anesthetic complications: no   No complications documented.   Last Vitals:  Vitals:   02/06/21 1236 02/06/21 1243  BP:    Pulse: 93 90  Resp: 19 10  Temp:    SpO2: 100% 98%    Last Pain:  Vitals:   02/06/21 1243  TempSrc:   PainSc: 7                  Sinan Tuch K

## 2021-02-06 NOTE — Discharge Instructions (Addendum)
Diagnostic Laparoscopy, Care After The following information offers guidance on how to care for yourself after your procedure. Your health care provider may also give you more specific instructions. If you have problems or questions, contact your health care provider. What can I expect after the procedure? After the procedure, it is common to have:  Mild discomfort in the abdomen.  Sore throat. Women who have laparoscopy with a pelvic examination may have mild cramping and fluid coming from the vagina for a few days after the procedure. Follow these instructions at home: Medicines  Take over-the-counter and prescription medicines only as told by your health care provider.  If you were prescribed an antibiotic medicine, take it as told by your health care provider. Do not stop taking the antibiotic even if you start to feel better.  Ask your health care provider if the medicine prescribed to you: ? Requires you to avoid driving or using machinery. ? Can cause constipation. You may need to take these actions to prevent or treat constipation:  Drink enough fluid to keep your urine pale yellow.  Take over-the-counter or prescription medicines.  Eat foods that are high in fiber, such as beans, whole grains, and fresh fruits and vegetables.  Limit foods that are high in fat and processed sugars, such as fried or sweet foods. Incision care  Follow instructions from your health care provider about how to take care of your incisions. Make sure you: ? Wash your hands with soap and water for at least 20 seconds before and after you change your bandage (dressing). If soap and water are not available, use hand sanitizer. ? Change your dressing as told by your health care provider. ? Leave stitches (sutures), skin glue, or surgical tape in place. These skin closures may need to stay in place for 2 weeks or longer. If surgical tape edges start to loosen and curl up, you may trim the loose edges. Do  not remove the surgical tape completely unless your health care provider tells you to do that.  Check your incision areas every day for signs of infection. Check for: ? Redness, swelling, or pain. ? Fluid or blood. ? Warmth. ? Pus or a bad smell.   Activity  Return to your normal activities as told by your health care provider. Ask your health care provider what activities are safe for you.  Do not lift anything that is heavier than 10 lb (4.5 kg), or the limit that you are told, until your health care provider says that it is safe.  Avoid sitting for a long time without moving. Get up to take short walks every 1-2 hours. This is important to improve blood flow and breathing. Ask for help if you feel weak or unsteady. General instructions  Do not use any products that contain nicotine or tobacco. These products include cigarettes, chewing tobacco, and vaping devices, such as e-cigarettes. If you need help quitting, ask your health care provider.  If you were given a sedative during the procedure, it can affect you for several hours. Do not drive or operate machinery until your health care provider says that it is safe.  Do not take baths, swim, or use a hot tub until your health care provider approves. Ask your health care provider if you may take showers. You may only be allowed to take sponge baths.  Keep all follow-up visits. This is important. Contact a health care provider if:  You develop shoulder pain.  You feel light-headed or  faint.  You are unable to pass gas or have a bowel movement.  You feel nauseous or you vomit.  You develop a rash.  You have any of these signs of infection: ? Redness, swelling, or pain around an incision. ? Fluid or blood coming from an incision. ? Warmth coming from an incision. ? Pus or a bad smell coming from an incision. ? A fever or chills. Get help right away if:  You have severe pain.  You have vomiting that does not go away.  You  have heavy bleeding from the vagina.  Any incision opens up.  You have trouble breathing.  You have chest pain. These symptoms may represent a serious problem that is an emergency. Do not wait to see if the symptoms will go away. Get medical help right away. Call your local emergency services (911 in the [place] Do not drive yourself to the hospital. Summary  After the procedure, it is common to have mild discomfort in the abdomen and a sore throat.  Check your incision areas every day for signs of infection.  Return to your normal activities as told by your health care provider. Ask your health care provider what activities are safe for you. This information is not intended to replace advice given to you by your health care provider. Make sure you discuss any questions you have with your health care provider. Document Revised: 04/15/2020 Document Reviewed: 04/15/2020 Elsevier Patient Education  2021 [place].    Intrauterine Device Insertion, Care After This sheet gives you information about how to care for yourself after your procedure. Your health care provider may also give you more specific instructions. If you have problems or questions, contact your health care provider. What can I expect after the procedure? After the procedure, it is common to have:  Cramps and pain in the abdomen.  Bleeding. It may be light or heavy. This may last for a few days.  Lower back pain.  Dizziness.  Headaches.  Nausea. Follow these instructions at home:  Before resuming sexual activity, check to make sure that you can feel the IUD string or strings. You should be able to feel the end of the string below the opening of your cervix. If your IUD string is in place, you may resume sexual activity. ? If you had a hormonal IUD inserted more than 7 days after your most recent period started, you will need to use a backup method of birth control for 7 days after IUD insertion. Ask your health  care provider whether this applies to you.  Continue to check that the IUD is still in place by feeling for the strings after every menstrual period, or once a month.  An IUD will not protect you from sexually transmitted infections (STIs). Use methods to prevent the exchange of body fluids between partners (barrier protection) every time you have sex. Barrier protection can be used during oral, vaginal, or anal sex. Commonly used barrier methods include: ? Female condom. ? Female condom. ? Dental dam.  Take over-the-counter and prescription medicines only as told by your health care provider.  Keep all follow-up visits as told by your health care provider. This is important.   Contact a health care provider if:  You feel light-headed or weak.  You have any of the following problems with your IUD string or strings: ? The string bothers or hurts you or your sexual partner. ? You cannot feel the string. ? The string has gotten  longer.  You can feel the IUD in your vagina.  You think you may be pregnant, or you miss your menstrual period.  You think you may have a sexually transmitted infection (STI). Get help right away if:  You have flu-like symptoms, such as tiredness (fatigue) and muscle aches.  You have a fever and chills.  You have bleeding that is heavier or lasts longer than a normal menstrual cycle.  You have abnormal or bad-smelling discharge from your vagina.  You develop abdominal pain that is new, is getting worse, or is not in the same area of earlier cramping and pain.  You have pain during sexual activity. Summary  After the procedure, it is common to have cramps and pain in the abdomen. It is also common to have light bleeding or heavier bleeding that is like your menstrual period.  Continue to check that the IUD is still in place by feeling for the strings after every menstrual period, or once a month.  Keep all follow-up visits as told by your health care  provider. This is important.  Contact your health care provider if you have problems with your IUD strings, such as the string getting longer or bothering you or your sexual partner. This information is not intended to replace advice given to you by your health care provider. Make sure you discuss any questions you have with your health care provider. Document Revised: 08/11/2019 Document Reviewed: 08/11/2019 Elsevier Patient Education  2021 [place]   1) The drugs that you were given will stay in your system until tomorrow so for the next 24 hours you should not:  A) Drive an automobile B) Make any legal decisions C) Drink any alcoholic beverage   2) You may resume regular meals tomorrow.  Today it is better to start with liquids and gradually work up to solid foods.  You may eat anything you prefer, but it is better to start with liquids, then soup and crackers, and gradually work up to solid foods.   3) Please notify your doctor immediately if you have any unusual bleeding, trouble breathing, redness and pain at the surgery site, drainage, fever, or pain not relieved by medication.    4) Additional Instructions:        Please contact your physician with any problems or Same Day Surgery at [phone], Monday through Friday 6 am to 4 pm, or [place] [facility] number at [phone].

## 2021-02-07 LAB — SURGICAL PATHOLOGY

## 2021-02-15 DIAGNOSIS — F603 Borderline personality disorder: Secondary | ICD-10-CM | POA: Diagnosis not present

## 2021-02-17 ENCOUNTER — Other Ambulatory Visit: Payer: Self-pay

## 2021-02-17 ENCOUNTER — Encounter: Payer: Self-pay | Admitting: Obstetrics and Gynecology

## 2021-02-17 ENCOUNTER — Ambulatory Visit (INDEPENDENT_AMBULATORY_CARE_PROVIDER_SITE_OTHER): Payer: BC Managed Care – PPO | Admitting: Obstetrics and Gynecology

## 2021-02-17 VITALS — BP 129/83 | HR 91 | Ht 60.0 in | Wt 190.5 lb

## 2021-02-17 DIAGNOSIS — Z975 Presence of (intrauterine) contraceptive device: Secondary | ICD-10-CM

## 2021-02-17 DIAGNOSIS — Z09 Encounter for follow-up examination after completed treatment for conditions other than malignant neoplasm: Secondary | ICD-10-CM

## 2021-02-17 DIAGNOSIS — Z8742 Personal history of other diseases of the female genital tract: Secondary | ICD-10-CM

## 2021-02-17 NOTE — Progress Notes (Signed)
Pt present for postop after surgery. Pt stated that she was doing well other than having some pain at the incision sites.

## 2021-02-17 NOTE — Progress Notes (Signed)
    OBSTETRICS/GYNECOLOGY POST-OPERATIVE CLINIC VISIT  Subjective:     Sandra Marsh is a 25 y.o. female who presents to the clinic 10 days status post for laparoscopic right ovarian cyst aspiration and IUD insertion. Eating a regular diet without difficulty. Bowel movements are normal. Pain is controlled without any medications.  The following portions of the patient's history were reviewed and updated as appropriate: allergies, current medications, past family history, past medical history, past social history, past surgical history, and problem list.  Review of Systems Pertinent items noted in HPI and remainder of comprehensive ROS otherwise negative.    Objective:    BP 129/83   Pulse 91   Ht 5' (1.524 m)   Wt 190 lb 8 oz (86.4 kg)   LMP 01/03/2021 (Exact Date)   BMI 37.20 kg/m  General:  alert and no distress  Abdomen: soft, bowel sounds active, non-tender  Incision:   healing well, no drainage, no erythema, no hernia, no seroma, no swelling, no dehiscence, incision well approximated    Pathology:   A. RIGHT OVARY; BIOPSY:  - FRAGMENT OF BENIGN OVARIAN PARENCHYMA.  - NO DEFINITIVE EVIDENCE OF CYST WALL.  - NO EVIDENCE OF ENDOMETRIOSIS.  - NEGATIVE FOR MALIGNANCY.    Assessment:   Doing well postoperatively. H/o right ovarian cyst s/p aspiration IUD in place  Plan:   1. Continue any current medications as needed. 2. Wound care discussed. 3. Operative findings reviewed. Pathology report discussed. 4. Activity restrictions: none 5. Anticipated return to work:  now, if applicable . 6. Follow up: 4 weeks for IUD check.    Rubie Maid, MD [facility]

## 2021-02-22 DIAGNOSIS — F603 Borderline personality disorder: Secondary | ICD-10-CM | POA: Diagnosis not present

## 2021-03-23 DIAGNOSIS — Z20822 Contact with and (suspected) exposure to covid-19: Secondary | ICD-10-CM | POA: Diagnosis not present

## 2021-03-30 ENCOUNTER — Encounter: Payer: BC Managed Care – PPO | Admitting: Obstetrics and Gynecology

## 2021-03-31 ENCOUNTER — Encounter: Payer: BC Managed Care – PPO | Admitting: Obstetrics and Gynecology

## 2021-04-03 ENCOUNTER — Encounter: Payer: Medicaid Other | Admitting: Certified Nurse Midwife

## 2021-04-04 ENCOUNTER — Encounter: Payer: Self-pay | Admitting: Certified Nurse Midwife

## 2021-04-04 ENCOUNTER — Other Ambulatory Visit: Payer: Self-pay

## 2021-04-04 ENCOUNTER — Ambulatory Visit (INDEPENDENT_AMBULATORY_CARE_PROVIDER_SITE_OTHER): Payer: BC Managed Care – PPO | Admitting: Certified Nurse Midwife

## 2021-04-04 VITALS — BP 129/88 | HR 109 | Ht 60.0 in | Wt 187.7 lb

## 2021-04-04 DIAGNOSIS — Z1159 Encounter for screening for other viral diseases: Secondary | ICD-10-CM | POA: Diagnosis not present

## 2021-04-04 DIAGNOSIS — Z114 Encounter for screening for human immunodeficiency virus [HIV]: Secondary | ICD-10-CM | POA: Diagnosis not present

## 2021-04-04 DIAGNOSIS — Z01419 Encounter for gynecological examination (general) (routine) without abnormal findings: Secondary | ICD-10-CM | POA: Diagnosis not present

## 2021-04-04 DIAGNOSIS — Z113 Encounter for screening for infections with a predominantly sexual mode of transmission: Secondary | ICD-10-CM

## 2021-04-04 NOTE — Progress Notes (Signed)
GYNECOLOGY ANNUAL PREVENTATIVE CARE ENCOUNTER NOTE  History:     Sandra Marsh is a 25 y.o. G0P0000 female here for a routine annual gynecologic exam.  Current complaints: none.   Denies abnormal vaginal bleeding, discharge, pelvic pain, problems with intercourse or other gynecologic concerns.     Social Relationship: female partner x 7 months Living: alone with 2 cats Work: housing Copywriter, advertising  Exercise: none Smoke/Alcohol/drug use: Vapes-10 to 15 time day, denies alcohol and drug use.   Gynecologic History Patient's last menstrual period was 04/02/2021 (exact date). Contraception: IUD Last Pap: 03/2020. Results were: normal  Last mammogram: n/a   The pregnancy intention screening data noted above was reviewed. Potential methods of contraception were discussed. The patient elected to proceed with IUD. Marland Kitchen   Obstetric History OB History  Gravida Para Term Preterm AB Living  0 0 0 0 0 0  SAB IAB Ectopic Multiple Live Births  0 0 0 0 0    Past Medical History:  Diagnosis Date   Anxiety 07/09/2018   MDD (major depressive disorder) 09/07/2019   Ovarian cyst    PCOS (polycystic ovarian syndrome)     Past Surgical History:  Procedure Laterality Date   INTRAUTERINE DEVICE (IUD) INSERTION N/A 02/06/2021   Procedure: INTRAUTERINE DEVICE (IUD) INSERTION;  Surgeon: Rubie Maid, MD;  Location: [facility] ORS;  Service: Gynecology;  Laterality: N/A;   LAPAROSCOPIC OVARIAN CYSTECTOMY Right 02/06/2021   Procedure: LAPAROSCOPIC OVARIAN CYSTECTOMY;  Surgeon: Rubie Maid, MD;  Location: [facility] ORS;  Service: Gynecology;  Laterality: Right;    Current Outpatient Medications on File Prior to Visit  Medication Sig Dispense Refill   buPROPion (WELLBUTRIN SR) 150 MG 12 hr tablet Take 150 mg by mouth every morning.     docusate sodium (COLACE) 100 MG capsule Take 1 capsule (100 mg total) by mouth 2 (two) times daily as needed. 30 capsule 1   ibuprofen (ADVIL) 800 MG tablet Take 1 tablet  (800 mg total) by mouth every 8 (eight) hours as needed. 60 tablet 0   lamoTRIgine (LAMICTAL) 200 MG tablet Take 200 mg by mouth in the morning.     hydrOXYzine (ATARAX/VISTARIL) 25 MG tablet Take 1 tablet (25 mg total) by mouth 3 (three) times daily as needed for itching or anxiety. (Patient not taking: Reported on 04/04/2021) 30 tablet 1   oxyCODONE-acetaminophen (PERCOCET) 5-325 MG tablet Take 1-2 tablets by mouth every 6 (six) hours as needed for severe pain. 15 tablet 0   simethicone (GAS-X) 80 MG chewable tablet Chew 1 tablet (80 mg total) by mouth 4 (four) times daily as needed for flatulence (bloating). 30 tablet 1   No current facility-administered medications on file prior to visit.    Allergies  Allergen Reactions   Nickel Rash    Social History:  reports that she has quit smoking. She has never used smokeless tobacco. She reports current drug use. Drug: Marijuana. She reports that she does not drink alcohol.  Family History  Problem Relation Age of Onset   Hypertension Paternal Grandmother    Ovarian cancer Mother     The following portions of the patient's history were reviewed and updated as appropriate: allergies, current medications, past family history, past medical history, past social history, past surgical history and problem list.  Review of Systems Pertinent items noted in HPI and remainder of comprehensive ROS otherwise negative.  Physical Exam:  BP 129/88   Pulse (!) 109   Ht 5' (1.524 m)  Wt 187 lb 11.2 oz (85.1 kg)   LMP 04/02/2021 (Exact Date)   BMI 36.66 kg/m  CONSTITUTIONAL: Well-developed, well-nourished , over weight female in no acute distress.  HENT:  Normocephalic, atraumatic, External right and left ear normal. Oropharynx is clear and moist EYES: Conjunctivae and EOM are normal. Pupils are equal, round, and reactive to light. No scleral icterus.  NECK: Normal range of motion, supple, no masses.  Normal thyroid.  SKIN: Skin is warm and dry. No  rash noted. Not diaphoretic. No erythema. No pallor. Multiple tattoos  MUSCULOSKELETAL: Normal range of motion. No tenderness.  No cyanosis, clubbing, or edema.  2+ distal pulses. NEUROLOGIC: Alert and oriented to person, place, and time. Normal reflexes, muscle tone coordination.  PSYCHIATRIC: Normal mood and affect. Normal behavior. Normal judgment and thought content. CARDIOVASCULAR: Normal heart rate noted, regular rhythm RESPIRATORY: Clear to auscultation bilaterally. Effort and breath sounds normal, no problems with respiration noted. BREASTS: Symmetric in size. No masses, tenderness, skin changes, nipple drainage, or lymphadenopathy bilaterally. Nipple piercing bilaterally  ABDOMEN: Soft, no distention noted.  No tenderness, rebound or guarding.  PELVIC: pt on her period, not due for pap , declines pelvic exam today.    Assessment and Plan:    1. Well woman exam with routine gynecological exam   Pap: not due Mammogram : n/a  Labs:HIV, Hep c  Refills: none Referral: none  Routine preventative health maintenance measures emphasized. Please refer to After Visit Summary for other counseling recommendations.      Philip Aspen, CNM [facility]

## 2021-04-06 LAB — HIV ANTIBODY (ROUTINE TESTING W REFLEX): HIV Screen 4th Generation wRfx: NONREACTIVE

## 2021-04-06 LAB — HEPATITIS C ANTIBODY: Hep C Virus Ab: <0.1 s/co ratio (ref 0.0–0.9)

## 2021-04-12 DIAGNOSIS — F603 Borderline personality disorder: Secondary | ICD-10-CM | POA: Diagnosis not present

## 2021-04-18 DIAGNOSIS — F5105 Insomnia due to other mental disorder: Secondary | ICD-10-CM | POA: Diagnosis not present

## 2021-04-18 DIAGNOSIS — F509 Eating disorder, unspecified: Secondary | ICD-10-CM | POA: Diagnosis not present

## 2021-04-18 DIAGNOSIS — F39 Unspecified mood [affective] disorder: Secondary | ICD-10-CM | POA: Diagnosis not present

## 2021-04-18 DIAGNOSIS — F411 Generalized anxiety disorder: Secondary | ICD-10-CM | POA: Diagnosis not present

## 2021-04-25 NOTE — Progress Notes (Signed)
    OBSTETRICS/GYNECOLOGY POST-OPERATIVE CLINIC VISIT  Subjective:     Sandra Marsh is a 25 y.o. [place] female who presents to the clinic for follow up and string check.  She is 10 weeks status post  laparoscopic right ovarian cyst aspiration and IUD insertion  for pelvic pain. She feels like she can feel the strings and has seen the strings from her IUD. Notes that she is still experiencing moderate cramping, sometimes before her cycle. Cycle is lasting a little longer (was 4-5 days, now 7 days however is lighter). Using 800 mg Ibuprofen for cramping occasionally or a heating pad. Notes that her overall pelvic pain continues to improve now that her cyst is gone.   The following portions of the patient's history were reviewed and updated as appropriate: allergies, current medications, past family history, past medical history, past social history, past surgical history, and problem list.  Review of Systems Pertinent items noted in HPI and remainder of comprehensive ROS otherwise negative.   Objective:   Blood pressure 107/73, pulse 88, resp. rate 16, height 5' (1.524 m), weight 189 lb (85.7 kg), last menstrual period 04/02/2021.  Body mass index is 36.91 kg/m.   General:  alert and no distress  Abdomen: soft, bowel sounds active, non-tender. Laparoscopic incision sites well healed.   Pelvis:    external genitalia normal, rectovaginal septum normal.  Vagina with small amount of thin yellow discharge.  Cervix normal appearing, no lesions and no motion tenderness.  IUD threads not visible.  Strings retrieved with cytobrush and  tonsil clamps, ~ 1 cm in length. Uterus mobile, nontender, normal shape and size.  Adnexae non-palpable, nontender bilaterally.    Extremities:  Nontender, no edema  Neurologic:  Grossly normal.     Assessment:   IUD check Dysmenorrhea PCOS H/o ovarian cyst Plan:   1. Can continue use of Mirena IUD for up to 7 years.  IUD threads shortened, informed patient  that she may no longer be able to feel regularly for her threads.   2. Dysmenorrhea, discussed other management tips including more frequent use of NSAIDs during her menses, decreasing caffeine intake (as patient notes she does intake a lot), and using Magnesium supplements.  3. PCOS and heavy cycles hopefully to be managed with IUD.  4. H/o ovarian cyst, doing better now since removal.  5. To follow up as needed.  Routine preventative health measures emphasized.   Rubie Maid, MD [facility]

## 2021-04-26 ENCOUNTER — Ambulatory Visit (INDEPENDENT_AMBULATORY_CARE_PROVIDER_SITE_OTHER): Payer: BC Managed Care – PPO | Admitting: Obstetrics and Gynecology

## 2021-04-26 ENCOUNTER — Other Ambulatory Visit: Payer: Self-pay

## 2021-04-26 ENCOUNTER — Encounter: Payer: Self-pay | Admitting: Obstetrics and Gynecology

## 2021-04-26 VITALS — BP 107/73 | HR 88 | Resp 16 | Ht 60.0 in | Wt 189.0 lb

## 2021-04-26 DIAGNOSIS — E282 Polycystic ovarian syndrome: Secondary | ICD-10-CM

## 2021-04-26 DIAGNOSIS — N946 Dysmenorrhea, unspecified: Secondary | ICD-10-CM | POA: Insufficient documentation

## 2021-04-26 DIAGNOSIS — Z975 Presence of (intrauterine) contraceptive device: Secondary | ICD-10-CM | POA: Insufficient documentation

## 2021-04-26 DIAGNOSIS — Z8742 Personal history of other diseases of the female genital tract: Secondary | ICD-10-CM

## 2021-04-26 NOTE — Patient Instructions (Signed)
Dysmenorrhea °Dysmenorrhea refers to cramps caused by the muscles of the uterus tightening (contracting) during a menstrual period. Dysmenorrhea may be mild, or it may be severe enough to interfere with everyday activities for a few days each month. Primary dysmenorrhea is menstrual cramps that last a couple of days when a female starts having menstrual periods or soon after. As a female gets older or has a baby, the cramps will usually lessen or disappear. °Secondary dysmenorrhea begins later in life and is caused by a disorder in the reproductive system. It lasts longer, and it may cause more pain than primary dysmenorrhea. The pain may start before the period and last a few days after the period. °What are the causes? °Dysmenorrhea is usually caused by an underlying problem, such as: °Endometriosis. The tissue that lines the uterus (endometrium) growing outside of the uterus in other areas of the body. °Adenomyosis. Endometrial tissue growing into the muscular walls of the uterus. °Pelvic congestive syndrome. Blood vessels in the pelvis that fill with blood just before the menstrual period. °Overgrowth of cells (polyps) in the endometrium or the lower part of the uterus (cervix). °Uterine prolapse. The uterus dropping down into the vagina due to stretched or weak muscles. °Bladder problems, such as infection or inflammation. °Intestinal problems, such as a tumor or irritable bowel syndrome. °Cancer of the reproductive organs or bladder. °Other causes of this condition may result from: °A severely tipped uterus. °A cervix that is closed or has a small opening. °Noncancerous (benign) tumors in the uterus (fibroids). °Pelvic inflammatory disease (PID). °Pelvic scarring (adhesions) from a previous surgery. °An ovarian cyst. °An IUD (intrauterine device). °What increases the risk? °You are more likely to develop this condition if: °You are younger than 26 years old. °You started puberty early. °You have irregular or  heavy bleeding. °You have never given birth. °You have a family history of dysmenorrhea. °You smoke or use nicotine products. °You have high body weight or a low body weight. °What are the signs or symptoms? °Symptoms of this condition include: °Cramping, throbbing pain in lower abdomen or lower back, or a feeling of fullness in the lower abdomen. °Periods lasting for longer than 7 days. °Headaches. °Bloating. °Fatigue. °Nausea or vomiting. °Diarrhea or loose stools. °Sweating or dizziness. °How is this diagnosed? °This condition may be diagnosed based on: °Your symptoms. °Your medical history. °A physical exam. °Blood tests. °A Pap test. This is a test in which cells from the cervix are tested for signs of cancer or infection. °A pregnancy test. °You may also have other tests, including: °Imaging tests, such as: °Ultrasound. °A procedure to remove and examine a sample of endometrial tissue (dilation and curettage, D&C). °A procedure to visually examine the inside of: °The uterus (hysteroscopy). °The abdomen or pelvis (laparoscopy). °The bladder (cystoscopy). °X-rays. °CT scan. °MRI. °How is this treated? °Treatment depends on the cause of the dysmenorrhea. Treatment may include medicines, such as: °Pain medicines. °Hormone replacement therapy. °Injections of progesterone to stop the menstrual period. °Birth control pills that contain the hormone progesterone. °An IUD that contains the hormone progesterone. °NSAIDs, such as ibuprofen. These may help to stop the production of hormones that cause cramps. °Antidepressant medicines. °Other treatment may include: °Surgery to remove adhesions, endometriosis, ovarian cysts, fibroids, or the entire uterus (hysterectomy). °Endometrial ablation. This is a procedure to destroy the endometrium. °Presacral neurectomy. This is a procedure to cut the nerves in the bottom of the spine (sacrum) that go to the reproductive organs. °Sacral nerve   stimulation. This is a procedure to  apply an electric current to nerves in the sacrum. °Exercise and physical therapy. °Meditation, yoga, and acupuncture. °Work with your health care provider to determine what treatment or combination of treatments is best for you. °Follow these instructions at home: °Relieving pain and cramping ° °If directed, apply heat to your lower back or abdomen when you experience pain or cramps. Use the heat source that your health care provider recommends, such as a moist heat pack or a heating pad. °Place a towel between your skin and the heat source. °Leave the heat on for 20-30 minutes. °Remove the heat if your skin turns bright red. This is especially important if you are unable to feel pain, heat, or cold. You may have a greater risk of getting burned. °Do not sleep with a heating pad on. °Exercise. Activities such as walking, swimming, or biking can help to relieve cramps. °Massage your lower back or abdomen to help relieve pain. °General instructions °Take over-the-counter and prescription medicines only as told by your health care provider. °Ask your health care provider if the medicine prescribed to you requires you to avoid driving or using machinery. °Avoid alcohol and caffeine during and right before your period. These can make cramps worse. °Do not use any products that contain nicotine or tobacco. These products include cigarettes, chewing tobacco, and vaping devices, such as e-cigarettes. If you need help quitting, ask your health care provider. °Keep all follow-up visits. This is important. °Contact a health care provider if: °You have pain that gets worse or does not get better with medicine. °You have pain with sex. °You develop nausea or vomiting with your period that is not controlled with medicine. °Get help right away if: °You faint. °Summary °Dysmenorrhea refers to cramps caused by the muscles of the uterus tightening (contracting) during a menstrual period. °Dysmenorrhea may be mild, or it may be  severe enough to interfere with everyday activities for a few days each month. °Treatment depends on the cause of the dysmenorrhea. °Work with your health care provider to determine what treatment or combination of treatments is best for you. °This information is not intended to replace advice given to you by your health care provider. Make sure you discuss any questions you have with your health care provider. °Document Revised: 04/06/2020 Document Reviewed: 04/06/2020 °Elsevier Patient Education © 2022 [employer] ° °

## 2021-05-03 DIAGNOSIS — F603 Borderline personality disorder: Secondary | ICD-10-CM | POA: Diagnosis not present

## 2021-05-10 DIAGNOSIS — F39 Unspecified mood [affective] disorder: Secondary | ICD-10-CM | POA: Diagnosis not present

## 2021-05-10 DIAGNOSIS — F411 Generalized anxiety disorder: Secondary | ICD-10-CM | POA: Diagnosis not present

## 2021-05-10 DIAGNOSIS — F5105 Insomnia due to other mental disorder: Secondary | ICD-10-CM | POA: Diagnosis not present

## 2021-05-10 DIAGNOSIS — F509 Eating disorder, unspecified: Secondary | ICD-10-CM | POA: Diagnosis not present

## 2021-05-17 DIAGNOSIS — F603 Borderline personality disorder: Secondary | ICD-10-CM | POA: Diagnosis not present

## 2021-05-29 DIAGNOSIS — Z20822 Contact with and (suspected) exposure to covid-19: Secondary | ICD-10-CM | POA: Diagnosis not present

## 2021-05-31 DIAGNOSIS — F603 Borderline personality disorder: Secondary | ICD-10-CM | POA: Diagnosis not present

## 2021-06-06 DIAGNOSIS — F429 Obsessive-compulsive disorder, unspecified: Secondary | ICD-10-CM | POA: Diagnosis not present

## 2021-06-06 DIAGNOSIS — F900 Attention-deficit hyperactivity disorder, predominantly inattentive type: Secondary | ICD-10-CM | POA: Diagnosis not present

## 2021-06-06 DIAGNOSIS — F3175 Bipolar disorder, in partial remission, most recent episode depressed: Secondary | ICD-10-CM | POA: Diagnosis not present

## 2021-06-13 DIAGNOSIS — F429 Obsessive-compulsive disorder, unspecified: Secondary | ICD-10-CM | POA: Diagnosis not present

## 2021-06-13 DIAGNOSIS — F900 Attention-deficit hyperactivity disorder, predominantly inattentive type: Secondary | ICD-10-CM | POA: Diagnosis not present

## 2021-06-13 DIAGNOSIS — F3175 Bipolar disorder, in partial remission, most recent episode depressed: Secondary | ICD-10-CM | POA: Diagnosis not present

## 2021-06-28 DIAGNOSIS — F603 Borderline personality disorder: Secondary | ICD-10-CM | POA: Diagnosis not present

## 2021-07-12 DIAGNOSIS — F603 Borderline personality disorder: Secondary | ICD-10-CM | POA: Diagnosis not present

## 2021-07-18 DIAGNOSIS — F411 Generalized anxiety disorder: Secondary | ICD-10-CM | POA: Diagnosis not present

## 2021-07-18 DIAGNOSIS — F509 Eating disorder, unspecified: Secondary | ICD-10-CM | POA: Diagnosis not present

## 2021-07-18 DIAGNOSIS — F5105 Insomnia due to other mental disorder: Secondary | ICD-10-CM | POA: Diagnosis not present

## 2021-07-18 DIAGNOSIS — F39 Unspecified mood [affective] disorder: Secondary | ICD-10-CM | POA: Diagnosis not present

## 2021-10-11 DIAGNOSIS — F603 Borderline personality disorder: Secondary | ICD-10-CM | POA: Diagnosis not present

## 2021-10-25 DIAGNOSIS — F603 Borderline personality disorder: Secondary | ICD-10-CM | POA: Diagnosis not present

## 2021-11-08 DIAGNOSIS — F5105 Insomnia due to other mental disorder: Secondary | ICD-10-CM | POA: Diagnosis not present

## 2021-11-08 DIAGNOSIS — F902 Attention-deficit hyperactivity disorder, combined type: Secondary | ICD-10-CM | POA: Diagnosis not present

## 2021-11-08 DIAGNOSIS — F39 Unspecified mood [affective] disorder: Secondary | ICD-10-CM | POA: Diagnosis not present

## 2021-11-08 DIAGNOSIS — F411 Generalized anxiety disorder: Secondary | ICD-10-CM | POA: Diagnosis not present

## 2021-11-08 DIAGNOSIS — F603 Borderline personality disorder: Secondary | ICD-10-CM | POA: Diagnosis not present

## 2021-11-15 DIAGNOSIS — F603 Borderline personality disorder: Secondary | ICD-10-CM | POA: Diagnosis not present

## 2021-11-29 DIAGNOSIS — F603 Borderline personality disorder: Secondary | ICD-10-CM | POA: Diagnosis not present

## 2021-12-18 ENCOUNTER — Other Ambulatory Visit (HOSPITAL_COMMUNITY)
Admission: RE | Admit: 2021-12-18 | Discharge: 2021-12-18 | Disposition: A | Discharge status: Home / Self Care | Payer: BC Managed Care – PPO | Source: Ambulatory Visit | Attending: Certified Nurse Midwife | Admitting: Certified Nurse Midwife

## 2021-12-18 ENCOUNTER — Ambulatory Visit (INDEPENDENT_AMBULATORY_CARE_PROVIDER_SITE_OTHER): Payer: BC Managed Care – PPO | Admitting: Certified Nurse Midwife

## 2021-12-18 ENCOUNTER — Encounter: Payer: Self-pay | Admitting: Certified Nurse Midwife

## 2021-12-18 VITALS — BP 128/86 | HR 116 | Ht 60.0 in | Wt 159.1 lb

## 2021-12-18 DIAGNOSIS — Z30431 Encounter for routine checking of intrauterine contraceptive device: Secondary | ICD-10-CM | POA: Diagnosis not present

## 2021-12-18 DIAGNOSIS — Z113 Encounter for screening for infections with a predominantly sexual mode of transmission: Secondary | ICD-10-CM | POA: Diagnosis not present

## 2021-12-18 DIAGNOSIS — L708 Other acne: Secondary | ICD-10-CM | POA: Diagnosis not present

## 2021-12-18 DIAGNOSIS — T8332XA Displacement of intrauterine contraceptive device, initial encounter: Secondary | ICD-10-CM

## 2021-12-18 NOTE — Progress Notes (Addendum)
? ?  GYNECOLOGY OFFICE ENCOUNTER NOTE ? ?History:  ?26 y.o. G0P0000 here today for today for IUD string check; Mirena  IUD was placed  06/19/2021. By Dr. Marcelline Mates.  She complains of pain that occurs few times a week , random. Notices increased frequency of pain when more active and with intercourse. She also notes increased acne. Marland Kitchen ?Pt request vaginal swab for STD testing, has  a new partner  ? ?The following portions of the patient's history were reviewed and updated as appropriate: allergies, current medications, past family history, past medical history, past social history, past surgical history and problem list.  ? ?Review of Systems:  ?Pertinent items are noted in HPI. ?  ?Objective:  ?Physical Exam ?Blood pressure 128/86, pulse (!) 116, height 5' (1.524 m), weight 159 lb 1.6 oz (72.2 kg). ?CONSTITUTIONAL: Well-developed, well-nourished female in no acute distress.  ?NEUROLOGIC: Alert and oriented to person, place, and time. Normal reflexes, muscle tone coordination.  ?PSYCHIATRIC: Normal mood and affect. Normal behavior. Normal judgment and thought content. ?CARDIOVASCULAR: Normal heart rate noted ?RESPIRATORY: Effort and breath sounds normal, no problems with respiration noted ?ABDOMEN: Soft, no distention noted.   ?PELVIC: Normal appearing external genitalia; normal appearing vaginal mucosa and cervix.  IUD strings not visualized.  ? ?Assessment & Plan:  ?Swab collected for STI screening per pt request. Fox swab used to remove excess discharge, no strings seen. U/s ordered for further evaluation. Discussed removal vs follow up with dermatology for acne treatment. She is in agreement. Orders placed for referral to dermatology. Will follow up with results of u/s and swab.  ? ?Philip Aspen, CNM  ? ? ?

## 2021-12-19 LAB — CERVICOVAGINAL ANCILLARY ONLY
Bacterial Vaginitis (gardnerella): NEGATIVE
Candida Glabrata: NEGATIVE
Candida Vaginitis: NEGATIVE
Chlamydia: NEGATIVE
Comment: NEGATIVE
Comment: NEGATIVE
Comment: NEGATIVE
Comment: NEGATIVE
Comment: NEGATIVE
Comment: NORMAL
Neisseria Gonorrhea: NEGATIVE
Trichomonas: NEGATIVE

## 2021-12-20 DIAGNOSIS — F603 Borderline personality disorder: Secondary | ICD-10-CM | POA: Diagnosis not present

## 2022-01-03 ENCOUNTER — Ambulatory Visit (INDEPENDENT_AMBULATORY_CARE_PROVIDER_SITE_OTHER): Payer: BC Managed Care – PPO

## 2022-01-03 DIAGNOSIS — T8332XA Displacement of intrauterine contraceptive device, initial encounter: Secondary | ICD-10-CM

## 2022-01-11 ENCOUNTER — Encounter: Payer: Self-pay | Admitting: Certified Nurse Midwife

## 2022-01-12 ENCOUNTER — Telehealth: Payer: Self-pay | Admitting: Certified Nurse Midwife

## 2022-01-12 NOTE — Telephone Encounter (Signed)
PT would like to discuss her resent [place]. Please return her call ?

## 2022-01-17 ENCOUNTER — Encounter: Payer: Self-pay | Admitting: Certified Nurse Midwife

## 2022-01-23 ENCOUNTER — Encounter: Payer: Self-pay | Admitting: Certified Nurse Midwife

## 2022-01-24 ENCOUNTER — Emergency Department: Payer: BC Managed Care – PPO

## 2022-01-24 ENCOUNTER — Emergency Department (HOSPITAL_COMMUNITY): Payer: BC Managed Care – PPO

## 2022-01-24 ENCOUNTER — Emergency Department
Admission: EM | Admit: 2022-01-24 | Discharge: 2022-01-25 | Disposition: A | Discharge status: Home / Self Care | Payer: BC Managed Care – PPO | Attending: Emergency Medicine | Admitting: Emergency Medicine

## 2022-01-24 ENCOUNTER — Other Ambulatory Visit: Payer: Self-pay | Admitting: Radiology

## 2022-01-24 ENCOUNTER — Encounter: Payer: Self-pay | Admitting: Medical Oncology

## 2022-01-24 DIAGNOSIS — R188 Other ascites: Secondary | ICD-10-CM | POA: Diagnosis not present

## 2022-01-24 DIAGNOSIS — N838 Other noninflammatory disorders of ovary, fallopian tube and broad ligament: Secondary | ICD-10-CM

## 2022-01-24 DIAGNOSIS — N83201 Unspecified ovarian cyst, right side: Secondary | ICD-10-CM | POA: Diagnosis not present

## 2022-01-24 DIAGNOSIS — N760 Acute vaginitis: Secondary | ICD-10-CM | POA: Insufficient documentation

## 2022-01-24 DIAGNOSIS — R102 Pelvic and perineal pain: Secondary | ICD-10-CM | POA: Diagnosis not present

## 2022-01-24 DIAGNOSIS — Z978 Presence of other specified devices: Secondary | ICD-10-CM | POA: Diagnosis not present

## 2022-01-24 DIAGNOSIS — N9489 Other specified conditions associated with female genital organs and menstrual cycle: Secondary | ICD-10-CM

## 2022-01-24 DIAGNOSIS — N7093 Salpingitis and oophoritis, unspecified: Secondary | ICD-10-CM | POA: Insufficient documentation

## 2022-01-24 LAB — URINALYSIS, ROUTINE W REFLEX MICROSCOPIC
Bilirubin Urine: NEGATIVE
Glucose, UA: NEGATIVE mg/dL
Hgb urine dipstick: NEGATIVE
Ketones, ur: NEGATIVE mg/dL
Leukocytes,Ua: NEGATIVE
Nitrite: NEGATIVE
Protein, ur: NEGATIVE mg/dL
Specific Gravity, Urine: 1.021 (ref 1.005–1.030)
pH: 5 (ref 5.0–8.0)

## 2022-01-24 LAB — WET PREP, GENITAL
Clue Cells Wet Prep HPF POC: NONE SEEN
Sperm: NONE SEEN
Trich, Wet Prep: NONE SEEN
WBC, Wet Prep HPF POC: >=10 — AB (ref <10)
Yeast Wet Prep HPF POC: NONE SEEN

## 2022-01-24 LAB — CHLAMYDIA/NGC RT PCR (ARMC ONLY)
Chlamydia Tr: NOT DETECTED
N gonorrhoeae: NOT DETECTED

## 2022-01-24 LAB — POC URINE PREG, ED: Preg Test, Ur: NEGATIVE

## 2022-01-24 MED ORDER — GADOBUTROL 1 MMOL/ML IV SOLN
7.0000 mL | Freq: Once | INTRAVENOUS | Status: AC | PRN
  Administered 2022-01-24: 7 mL via INTRAVENOUS

## 2022-01-24 NOTE — ED Notes (Signed)
ED Provider at bedside. 

## 2022-01-24 NOTE — ED Notes (Signed)
Pt in [place]

## 2022-01-24 NOTE — ED Notes (Signed)
See triage note. Pt denies fever, denies V/D, reports was initially nauseous and dizzy but not anymore, cramping at medial lower abdomen, history of cysts; had a cyst removed last year over the summer. Site nontender. Pt reports slight constipation and urination has been normal. Pt's resp reg/unlabored, skin dry, sitting calmly on stretcher.

## 2022-01-24 NOTE — ED Provider Triage Note (Signed)
Emergency Medicine Provider Triage Evaluation Note  Sandra Marsh , a 26 y.o. female  was evaluated in triage.  Pt complains of right-sided pelvic pain.  Patient has noticed increased pressure and changes in vaginal odor and a small amount of vaginal bleeding.  She denies fever, chills or deep dyspareunia.  Review of Systems  Positive: Patient has pelvic pain  Negative: Patient has right sided pelvic pain.   Physical Exam  BP 125/87 (BP Location: Left Arm)   Pulse (!) 107   Temp 99.2 F (37.3 C) (Oral)   Resp 17   Ht 5' (1.524 m)   Wt 72.1 kg   SpO2 100%   BMI 31.05 kg/m  Gen:   Awake, no distress   Resp:  Normal effort  MSK:   Moves extremities without difficulty  Other:    Medical Decision Making  Medically screening exam initiated at 4:40 PM.  Appropriate orders placed.  Sandra Marsh was informed that the remainder of the evaluation will be completed by another provider, this initial triage assessment does not replace that evaluation, and the importance of remaining in the ED until their evaluation is complete.     Vallarie Mare [place], [place] 01/24/22 1641

## 2022-01-24 NOTE — ED Provider Notes (Signed)
[facility] Provider Note    Event Date/Time   First MD Initiated Contact with Patient 01/24/22 1755     (approximate)   History   Abdominal Pain and Pelvic Pain   HPI  Sandra Marsh is a 26 y.o. female with history of ovarian cysts and as listed in EMR presents to the emergency department for treatment and evaluation of pelvic pain and pressure with vaginal odor for the past 4 days. No concern for STD.      Physical Exam   Triage Vital Signs: ED Triage Vitals [01/24/22 1634]  Enc Vitals Group     BP 125/87     Pulse Rate (!) 107     Resp 17     Temp 99.2 F (37.3 C)     Temp Source Oral     SpO2 100 %     Weight 159 lb (72.1 kg)     Height 5' (1.524 m)     Head Circumference      Peak Flow      Pain Score 4     Pain Loc      Pain Edu?      Excl. in [place]?     Most recent vital signs: Vitals:   01/24/22 1634 01/24/22 1943  BP: 125/87 (!) 116/95  Pulse: (!) 107 96  Resp: 17 18  Temp: 99.2 F (37.3 C)   SpO2: 100% 100%    General: Awake, no distress.  CV:  Good peripheral perfusion.  Resp:  Normal effort.  Abd:  No distention.  Other:  No CMT; cervix closed, thin white discharge in vaginal vault.   ED Results / Procedures / Treatments   Labs (all labs ordered are listed, but only abnormal results are displayed) Labs Reviewed  WET PREP, GENITAL - Abnormal; Notable for the following components:      Result Value   WBC, Wet Prep HPF POC >=10 (*)    All other components within normal limits  URINALYSIS, ROUTINE W REFLEX MICROSCOPIC - Abnormal; Notable for the following components:   Color, Urine YELLOW (*)    APPearance HAZY (*)    All other components within normal limits  CHLAMYDIA/NGC RT PCR ([facility] ONLY)            POC URINE PREG, ED     EKG  Not indicated.   RADIOLOGY  Image and radiology report reviewed by me.  Pelvic ultrasound showing an 8 cm complex cyst right adnexa with diffuse internal echoes and  nodular components with color-flow doppler signal indicated. Echoes have increased since last study. MR recommended.  PROCEDURES:  Critical Care performed: No  Procedures   MEDICATIONS ORDERED IN ED: Medications - No data to display   IMPRESSION / MDM / [place] / ED COURSE   I have reviewed the triage note.  Differential diagnosis includes, but is not limited to: STI, bacterial vaginosis, vaginal yeast infection, UTI, ovarian cyst.  26 year old female presenting to the emergency department for treatment and evaluation of pelvic pain and pressure with vaginal discharge.  See HPI for further details.   On exam, cervix is closed.  There is no cervical motion tenderness on speculum exam.  There is the moderate amount of thin white discharge in the vaginal vault.  Specimen sent to the lab.  Wet prep shows white blood cells but is otherwise negative.  GC and Chlamydia is tests are pending.  Ultrasound shows a complex cyst in the right  adnexa is concerning due to suspicious features.  MRI or surgical consultation was recommended by radiology.  The patient is in no acute distress and is not having any nausea or vomiting.  Her pain is well controlled without medication.  Do not feel that surgical consult is indicated at this time.  MR pelvis ordered. Dr. Cheri Fowler will follow up on results and any other treatment required.       FINAL CLINICAL IMPRESSION(S) / ED DIAGNOSES   Final diagnoses:  Acute vaginitis  Adnexal mass     Rx / DC Orders   ED Discharge Orders     None        Note:  This document was prepared using Dragon voice recognition software and may include unintentional dictation errors.   Victorino Dike, FNP 01/24/22 2008    Naaman Plummer, MD 01/24/22 [id number]

## 2022-01-24 NOTE — ED Triage Notes (Signed)
Pt reports that she has a history of ovarian cysts, states that she recently was told that she has a large cyst on the right. Pt reports Saturday she began having lots of pelvic pain/pressure and vaginal odor. Pt reports small amt of spotting.

## 2022-01-24 NOTE — ED Notes (Signed)
Patient transported to MRI 

## 2022-01-25 NOTE — ED Notes (Signed)
Pt discharge information reviewed. Pt understands need for follow up care and when to return if symptoms worsen. All questions answered. Pt is alert and oriented with even and regular respirations. Pt is seen ambulating out of department with string steady gait.   

## 2022-01-30 ENCOUNTER — Telehealth: Payer: Self-pay | Admitting: Certified Nurse Midwife

## 2022-01-30 NOTE — Telephone Encounter (Signed)
Pt called asking to leave a message for Deneise Lever, sating that she got results from recent MRI and the cyst is larger. Pt was advised to see dr.cherry asap- she is schedule for 6-13 and on our waitlist. She requested to make annie aware so she can view mri.

## 2022-01-31 DIAGNOSIS — F603 Borderline personality disorder: Secondary | ICD-10-CM | POA: Diagnosis not present

## 2022-02-01 ENCOUNTER — Encounter: Payer: Self-pay | Admitting: Obstetrics and Gynecology

## 2022-02-02 DIAGNOSIS — F411 Generalized anxiety disorder: Secondary | ICD-10-CM | POA: Diagnosis not present

## 2022-02-02 DIAGNOSIS — F5105 Insomnia due to other mental disorder: Secondary | ICD-10-CM | POA: Diagnosis not present

## 2022-02-02 DIAGNOSIS — F902 Attention-deficit hyperactivity disorder, combined type: Secondary | ICD-10-CM | POA: Diagnosis not present

## 2022-02-02 DIAGNOSIS — F39 Unspecified mood [affective] disorder: Secondary | ICD-10-CM | POA: Diagnosis not present

## 2022-02-13 ENCOUNTER — Encounter: Payer: Self-pay | Admitting: Obstetrics and Gynecology

## 2022-02-13 ENCOUNTER — Ambulatory Visit (INDEPENDENT_AMBULATORY_CARE_PROVIDER_SITE_OTHER): Payer: BC Managed Care – PPO | Admitting: Obstetrics and Gynecology

## 2022-02-13 VITALS — BP 112/82 | HR 107 | Ht 60.0 in | Wt 151.5 lb

## 2022-02-13 DIAGNOSIS — N83201 Unspecified ovarian cyst, right side: Secondary | ICD-10-CM

## 2022-02-13 DIAGNOSIS — R102 Pelvic and perineal pain: Secondary | ICD-10-CM | POA: Diagnosis not present

## 2022-02-13 DIAGNOSIS — Z975 Presence of (intrauterine) contraceptive device: Secondary | ICD-10-CM | POA: Diagnosis not present

## 2022-02-13 DIAGNOSIS — R634 Abnormal weight loss: Secondary | ICD-10-CM

## 2022-02-13 NOTE — Progress Notes (Signed)
GYNECOLOGY PROGRESS NOTE  Subjective:    Patient ID: Sandra Marsh, female    DOB: [birth year], 26 y.o.   MRN: [id number]  HPI  Patient is a 26 y.o. G0P0000 female who presents for follow up of large right ovarian cyst. She went to ED on 01/24/2022 for evaluation of pelvic pain and pressure with vaginal odor for the past 4 days. During that time a Ultrasound was obtained and the following was found:  IMPRESSION: 1. 8 cm diameter complex cyst in the right adnexum with diffuse internal echoes and nodular components with color flow Doppler signal indicated. Since the previous study, the internal echoes have increased. Size is similar. This represents an indeterminate lesion with suspicious features. Recommend MRI or surgical consultation for further evaluation. 2. Uterus and left ovary are normal. 3. Intrauterine device is in place.   Of note, patient has a h/o right ovarian cyst s/p laparoscopic aspiration late last year.  Currently she notes that the pain is not as severe as upon initial presentation. Also is noting a clear discharge over the past several weeks, unsure if it is urinary or from the vagina.   Lastly, she also expresses concern regarding recent weight loss. Reports that in the ER she weighed ~ 9 lbs more.   The following portions of the patient's history were reviewed and updated as appropriate: allergies, current medications, past family history, past medical history, past social history, past surgical history, and problem list.  Review of Systems Pertinent items are noted in HPI.   Objective:   Blood pressure 112/82, pulse (!) 107, height 5' (1.524 m), weight 151 lb 8 oz (68.7 kg). Body mass index is 29.59 kg/m. General appearance: alert and no distress Abdomen: normal findings: no masses palpable, no organomegaly, and abnormal findings: mild tenderness in lower abdomen.  See H&P for full exam.    Assessment:   1. Right ovarian cyst   2. Weight loss   3.  Pelvic pain   4. IUD (intrauterine device) in place      Plan:   1. Right ovarian cyst -Discussion had with patient regarding.  As this first is a second recurrence of the cyst in a short amount of time I recommended removal of the cyst and ovary as now appears more complex and may likely be representative of a dermoid.  Patient notes understanding, states that she was ready for the recommendation as she no longer wants to deal with recurrent cyst.  Patient will be for surgical management with laparoscopic right oophorectomy.  The risks of surgery were discussed in detail with the patient including but not limited to: bleeding which may require transfusion or reoperation; infection which may require prolonged hospitalization or re-hospitalization and antibiotic therapy; injury to bowel, bladder, ureters and major vessels or other surrounding organs which may lead to other procedures; formation of adhesions; need for additional procedures including laparotomy or subsequent procedures secondary to intraoperative injury or abnormal pathology; thromboembolic phenomenon; incisional problems and other postoperative or anesthesia complications.  Patient was told that the likelihood that her condition and symptoms will be treated effectively with this surgical management was very high; the postoperative expectations were also discussed in detail. The patient also understands the alternative treatment options which were discussed in full. All questions were answered.  She was told that she will be contacted by our surgical scheduler regarding the time and date of her surgery; routine preoperative instructions will be given to her by the preoperative nursing team.  Surgery has been scheduled for 03/12/2022.   2. Weight loss -Patient concerned regarding her weight loss in the presence cyst.  I discussed with patient that despite his complexity is still most likely benign in nature.  I do not suspect any issues  concerning for ovarian cancer at her age.  I discussed other reasons for weight loss including pain and discomfort leading to her decreased appetite.  3. Pelvic pain -Pelvic pain most likely due to the ovarian cyst.  Patient notes the pain right now is more tolerable.  However also thinks that some of her pain may be related to her IUD as she has had issues with that in the past.  Continue use of Tylenol and NSAIDs as needed.  4. IUD (intrauterine device) in place -Patient with IUD in place, however would like to discuss removal since she now will be undergoing surgery.  She reports that since the insertion of her IUD she has had worsening issues with acne, and sometimes still noting some pain.  She does report that the IUD did help with her menstrual cycles and her overall dysmenorrhea however would like to consider a different option at this time.  I discussed that she can have her IUD removed at the time of her surgery.  Patient would like to try to go back to OCPs.  I discussed the option of continuous use versus cyclical to continue to help with management of her cycles and her acne.  Patient notes she would be willing to try this.  Will prescribe after procedure.   A total of 25 minutes were spent face-to-face with the patient during this encounter and over half of that time involved counseling and coordination of care.   Rubie Maid, MD [facility]

## 2022-02-14 ENCOUNTER — Encounter: Payer: Self-pay | Admitting: Obstetrics and Gynecology

## 2022-02-14 DIAGNOSIS — F603 Borderline personality disorder: Secondary | ICD-10-CM | POA: Diagnosis not present

## 2022-02-14 NOTE — Patient Instructions (Signed)
GYNECOLOGY PRE-OPERATIVE INSTRUCTIONS  You are scheduled for surgery on 03/12/2022.  The name of your procedure is: Laparoscopic right oophorectomy, IUD removal.   Please read through these instructions carefully regarding preparation for your surgery: Nothing to eat after midnight on the day prior to surgery.  Do not take any medications unless recommended by your provider on day prior to surgery.  Do not take NSAIDs (Motrin, Aleve) or aspirin 7 days prior to surgery.  You may take Tylenol products for minor aches and pains.  You will receive a prescription for pain medications post-operatively.  You will be contacted by phone approximately 1 week prior to surgery to schedule your pre-operative appointment.  Please call the office if you have any questions regarding your upcoming surgery.    Thank you for choosing [facility].

## 2022-02-28 DIAGNOSIS — F603 Borderline personality disorder: Secondary | ICD-10-CM | POA: Diagnosis not present

## 2022-03-05 ENCOUNTER — Encounter
Admission: RE | Admit: 2022-03-05 | Discharge: 2022-03-05 | Disposition: A | Discharge status: Home / Self Care | Payer: BC Managed Care – PPO | Source: Ambulatory Visit | Attending: Obstetrics and Gynecology | Admitting: Obstetrics and Gynecology

## 2022-03-05 ENCOUNTER — Encounter: Payer: Self-pay | Admitting: Obstetrics and Gynecology

## 2022-03-05 VITALS — Ht 60.0 in | Wt 148.0 lb

## 2022-03-05 DIAGNOSIS — Z01812 Encounter for preprocedural laboratory examination: Secondary | ICD-10-CM

## 2022-03-05 NOTE — Patient Instructions (Addendum)
Your procedure is scheduled on: Monday March 12, 2022. Report to Day Surgery inside [facility] 2nd floor, stop by admissions desk before getting on elevator.  To find out your arrival time please call [phone] between 1PM - 3PM on Friday March 09, 2022.  Remember: Instructions that are not followed completely may result in serious medical risk,  up to and including death, or upon the discretion of your surgeon and anesthesiologist your  surgery may need to be rescheduled.     _X__ 1. Do not eat food after midnight the night before your procedure.                 No chewing gum or hard candies.   __X__2.  On the morning of surgery brush your teeth with toothpaste and water, you                may rinse your mouth with mouthwash if you wish.  Do not swallow any toothpaste or mouthwash.     _X__ 3.  No Alcohol for 24 hours before or after surgery.   _X__ 4.  Do Not Smoke or use e-cigarettes For 24 Hours Prior to Your Surgery.                 Do not use any chewable tobacco products for at least 6 hours prior to                 Surgery.  _X__  5.  Do not use any recreational drugs (marijuana, cocaine, heroin, ecstasy, MDMA or other)                For at least one week prior to your surgery.  Combination of these drugs with anesthesia                May have life threatening results.  ____  6.  Bring all medications with you on the day of surgery if instructed.   __X__  7.  Notify your doctor if there is any change in your medical condition      (cold, fever, infections).     Do not wear jewelry, make-up, hairpins, clips or nail polish. Do not wear lotions, powders, or perfumes. You may wear deodorant. Do not shave 48 hours prior to surgery.  Do not bring valuables to the hospital.    [facility] is not responsible for any belongings or valuables.  Contacts, dentures or bridgework may not be worn into surgery. Leave your suitcase in the car. After surgery  it may be brought to your room. For patients admitted to the hospital, discharge time is determined by your treatment team.   Patients discharged the day of surgery will not be allowed to drive home.   Make arrangements for someone to be with you for the first 24 hours of your Same Day Discharge.    __x__ Take these medicines the morning of surgery with A SIP OF WATER:    1. buPROPion (WELLBUTRIN SR) 150 MG   2. lamoTRIgine (LAMICTAL) 200 MG   3.   4.  5.  6.  ____ Fleet Enema (as directed)   __X__ Use CHG Soap (or wipes) as directed  ____ Use Benzoyl Peroxide Gel as instructed  ____ Use inhalers on the day of surgery  ____ Stop metformin 2 days prior to surgery    ____ Take 1/2 of usual insulin dose the night before surgery. No insulin the morning  of surgery.   ____ Call your PCP, cardiologist, or Pulmonologist if taking Coumadin/Plavix/aspirin and ask when to stop before your surgery.   __X__ One Week prior to surgery- Stop Anti-inflammatories such as Ibuprofen, Aleve, Advil, Motrin, meloxicam (MOBIC), diclofenac, etodolac, ketorolac, Toradol, Daypro, piroxicam, Goody's or BC powders. OK TO USE TYLENOL IF NEEDED   __X__ Stop supplements until after surgery.    ____ Bring C-Pap to the hospital.    If you have any questions regarding your pre-procedure instructions,  Please call Pre-admit Testing at [phone]

## 2022-03-07 ENCOUNTER — Encounter
Admission: RE | Admit: 2022-03-07 | Discharge: 2022-03-07 | Disposition: A | Discharge status: Home / Self Care | Payer: BC Managed Care – PPO | Source: Ambulatory Visit | Attending: Obstetrics and Gynecology | Admitting: Obstetrics and Gynecology

## 2022-03-07 DIAGNOSIS — Z01818 Encounter for other preprocedural examination: Secondary | ICD-10-CM | POA: Diagnosis not present

## 2022-03-07 DIAGNOSIS — Z01812 Encounter for preprocedural laboratory examination: Secondary | ICD-10-CM | POA: Diagnosis not present

## 2022-03-07 LAB — TYPE AND SCREEN
ABO/RH(D): O POS
Antibody Screen: NEGATIVE

## 2022-03-11 MED ORDER — LACTATED RINGERS IV SOLN: INTRAVENOUS | Status: DC

## 2022-03-11 MED ORDER — CHLORHEXIDINE GLUCONATE 0.12 % MT SOLN: 15.0000 mL | Freq: Once | OROMUCOSAL | Status: AC

## 2022-03-11 MED ORDER — ORAL CARE MOUTH RINSE: 15.0000 mL | Freq: Once | OROMUCOSAL | Status: AC

## 2022-03-11 MED ORDER — FAMOTIDINE 20 MG PO TABS: 20.0000 mg | ORAL_TABLET | Freq: Once | ORAL | Status: AC

## 2022-03-11 MED ORDER — GABAPENTIN 300 MG PO CAPS: 300.0000 mg | ORAL_CAPSULE | ORAL | Status: AC

## 2022-03-11 MED ORDER — ACETAMINOPHEN 500 MG PO TABS: 1000.0000 mg | ORAL_TABLET | ORAL | Status: AC

## 2022-03-12 ENCOUNTER — Other Ambulatory Visit: Payer: Self-pay

## 2022-03-12 ENCOUNTER — Ambulatory Visit: Payer: BC Managed Care – PPO | Admitting: Certified Registered Nurse Anesthetist

## 2022-03-12 ENCOUNTER — Encounter: Payer: Self-pay | Admitting: Obstetrics and Gynecology

## 2022-03-12 ENCOUNTER — Encounter
Admission: RE | Disposition: A | Discharge status: Home / Self Care | Payer: Self-pay | Source: Home / Self Care | Attending: Obstetrics and Gynecology

## 2022-03-12 ENCOUNTER — Ambulatory Visit
Admission: RE | Admit: 2022-03-12 | Discharge: 2022-03-12 | Disposition: A | Discharge status: Home / Self Care | Payer: BC Managed Care – PPO | Attending: Obstetrics and Gynecology | Admitting: Obstetrics and Gynecology

## 2022-03-12 DIAGNOSIS — F329 Major depressive disorder, single episode, unspecified: Secondary | ICD-10-CM | POA: Diagnosis not present

## 2022-03-12 DIAGNOSIS — N83201 Unspecified ovarian cyst, right side: Secondary | ICD-10-CM | POA: Diagnosis not present

## 2022-03-12 DIAGNOSIS — D270 Benign neoplasm of right ovary: Secondary | ICD-10-CM | POA: Insufficient documentation

## 2022-03-12 DIAGNOSIS — D3911 Neoplasm of uncertain behavior of right ovary: Secondary | ICD-10-CM | POA: Diagnosis not present

## 2022-03-12 DIAGNOSIS — F419 Anxiety disorder, unspecified: Secondary | ICD-10-CM | POA: Insufficient documentation

## 2022-03-12 DIAGNOSIS — Z01812 Encounter for preprocedural laboratory examination: Secondary | ICD-10-CM

## 2022-03-12 DIAGNOSIS — Z87891 Personal history of nicotine dependence: Secondary | ICD-10-CM | POA: Insufficient documentation

## 2022-03-12 DIAGNOSIS — R102 Pelvic and perineal pain: Secondary | ICD-10-CM

## 2022-03-12 DIAGNOSIS — N946 Dysmenorrhea, unspecified: Secondary | ICD-10-CM

## 2022-03-12 DIAGNOSIS — Z975 Presence of (intrauterine) contraceptive device: Secondary | ICD-10-CM | POA: Diagnosis not present

## 2022-03-12 HISTORY — PX: IUD REMOVAL: SHX5392

## 2022-03-12 LAB — POCT PREGNANCY, URINE: Preg Test, Ur: NEGATIVE

## 2022-03-12 SURGERY — OOPHORECTOMY, LAPAROSCOPIC
Anesthesia: General | Site: Uterus | Laterality: Right

## 2022-03-12 MED ORDER — ONDANSETRON HCL 4 MG/2ML IJ SOLN
INTRAMUSCULAR | Status: DC | PRN
  Administered 2022-03-12: 4 mg via INTRAVENOUS

## 2022-03-12 MED ORDER — SIMETHICONE 80 MG PO CHEW
80.0000 mg | CHEWABLE_TABLET | Freq: Four times a day (QID) | ORAL | 2 refills | Status: DC | PRN
Start: 2022-03-12 — End: 2022-03-28

## 2022-03-12 MED ORDER — FENTANYL CITRATE (PF) 100 MCG/2ML IJ SOLN
INTRAMUSCULAR | Status: AC
  Administered 2022-03-12: 25 ug via INTRAVENOUS
  Filled 2022-03-12: qty 2

## 2022-03-12 MED ORDER — IBUPROFEN 800 MG PO TABS: 800.0000 mg | ORAL_TABLET | Freq: Three times a day (TID) | ORAL | 1 refills | Status: DC | PRN

## 2022-03-12 MED ORDER — MIDAZOLAM HCL 2 MG/2ML IJ SOLN
INTRAMUSCULAR | Status: AC
  Filled 2022-03-12: qty 2

## 2022-03-12 MED ORDER — CHLORHEXIDINE GLUCONATE 0.12 % MT SOLN
OROMUCOSAL | Status: AC
  Administered 2022-03-12: 15 mL via OROMUCOSAL
  Filled 2022-03-12: qty 15

## 2022-03-12 MED ORDER — FENTANYL CITRATE (PF) 100 MCG/2ML IJ SOLN
25.0000 ug | INTRAMUSCULAR | Status: DC | PRN
  Administered 2022-03-12 (×3): 25 ug via INTRAVENOUS

## 2022-03-12 MED ORDER — OXYCODONE HCL 5 MG PO TABS
ORAL_TABLET | ORAL | Status: AC
  Filled 2022-03-12: qty 1

## 2022-03-12 MED ORDER — LIDOCAINE HCL (CARDIAC) PF 100 MG/5ML IV SOSY
PREFILLED_SYRINGE | INTRAVENOUS | Status: DC | PRN
  Administered 2022-03-12: 80 mg via INTRAVENOUS

## 2022-03-12 MED ORDER — BUPIVACAINE HCL (PF) 0.5 % IJ SOLN
INTRAMUSCULAR | Status: AC
  Filled 2022-03-12: qty 30

## 2022-03-12 MED ORDER — OXYCODONE HCL 5 MG PO TABS
5.0000 mg | ORAL_TABLET | Freq: Once | ORAL | Status: AC | PRN
  Administered 2022-03-12: 5 mg via ORAL

## 2022-03-12 MED ORDER — PROPOFOL 10 MG/ML IV BOLUS
INTRAVENOUS | Status: DC | PRN
  Administered 2022-03-12: 200 mg via INTRAVENOUS

## 2022-03-12 MED ORDER — BUPIVACAINE HCL 0.5 % IJ SOLN
INTRAMUSCULAR | Status: DC | PRN
  Administered 2022-03-12: 15 mL

## 2022-03-12 MED ORDER — OXYCODONE-ACETAMINOPHEN 5-325 MG PO TABS: 1.0000 | ORAL_TABLET | Freq: Four times a day (QID) | ORAL | 0 refills | Status: DC | PRN

## 2022-03-12 MED ORDER — 0.9 % SODIUM CHLORIDE (POUR BTL) OPTIME
TOPICAL | Status: DC | PRN
  Administered 2022-03-12: 500 mL

## 2022-03-12 MED ORDER — FENTANYL CITRATE (PF) 100 MCG/2ML IJ SOLN
INTRAMUSCULAR | Status: AC
  Filled 2022-03-12: qty 2

## 2022-03-12 MED ORDER — PHENYLEPHRINE 80 MCG/ML (10ML) SYRINGE FOR IV PUSH (FOR BLOOD PRESSURE SUPPORT)
PREFILLED_SYRINGE | INTRAVENOUS | Status: DC | PRN
  Administered 2022-03-12: 80 ug via INTRAVENOUS

## 2022-03-12 MED ORDER — LEVONORGEST-ETH ESTRAD 91-DAY 0.15-0.03 MG PO TABS: 1.0000 | ORAL_TABLET | Freq: Every day | ORAL | 4 refills | Status: DC

## 2022-03-12 MED ORDER — ACETAMINOPHEN 500 MG PO TABS
ORAL_TABLET | ORAL | Status: AC
  Administered 2022-03-12: 1000 mg via ORAL
  Filled 2022-03-12: qty 2

## 2022-03-12 MED ORDER — MIDAZOLAM HCL 2 MG/2ML IJ SOLN
INTRAMUSCULAR | Status: DC | PRN
  Administered 2022-03-12: 2 mg via INTRAVENOUS

## 2022-03-12 MED ORDER — ROCURONIUM BROMIDE 100 MG/10ML IV SOLN
INTRAVENOUS | Status: DC | PRN
  Administered 2022-03-12: 50 mg via INTRAVENOUS

## 2022-03-12 MED ORDER — KETOROLAC TROMETHAMINE 30 MG/ML IJ SOLN
INTRAMUSCULAR | Status: DC | PRN
  Administered 2022-03-12: 30 mg via INTRAVENOUS

## 2022-03-12 MED ORDER — ACETAMINOPHEN 10 MG/ML IV SOLN: 1000.0000 mg | Freq: Once | INTRAVENOUS | Status: DC | PRN

## 2022-03-12 MED ORDER — PROMETHAZINE HCL 25 MG/ML IJ SOLN: 6.2500 mg | INTRAMUSCULAR | Status: DC | PRN

## 2022-03-12 MED ORDER — GABAPENTIN 300 MG PO CAPS
ORAL_CAPSULE | ORAL | Status: AC
  Administered 2022-03-12: 300 mg via ORAL
  Filled 2022-03-12: qty 1

## 2022-03-12 MED ORDER — FENTANYL CITRATE (PF) 100 MCG/2ML IJ SOLN
INTRAMUSCULAR | Status: DC | PRN
  Administered 2022-03-12: 100 ug via INTRAVENOUS

## 2022-03-12 MED ORDER — OXYCODONE HCL 5 MG/5ML PO SOLN: 5.0000 mg | Freq: Once | ORAL | Status: AC | PRN

## 2022-03-12 MED ORDER — SUGAMMADEX SODIUM 200 MG/2ML IV SOLN
INTRAVENOUS | Status: DC | PRN
  Administered 2022-03-12: 200 mg via INTRAVENOUS

## 2022-03-12 MED ORDER — FAMOTIDINE 20 MG PO TABS
ORAL_TABLET | ORAL | Status: AC
  Administered 2022-03-12: 20 mg via ORAL
  Filled 2022-03-12: qty 1

## 2022-03-12 MED ORDER — DEXAMETHASONE SODIUM PHOSPHATE 10 MG/ML IJ SOLN
INTRAMUSCULAR | Status: DC | PRN
  Administered 2022-03-12: 10 mg via INTRAVENOUS

## 2022-03-12 MED ORDER — PROPOFOL 10 MG/ML IV BOLUS
INTRAVENOUS | Status: AC
  Filled 2022-03-12: qty 20

## 2022-03-12 SURGICAL SUPPLY — 48 items
ADH SKN CLS APL DERMABOND .7 (GAUZE/BANDAGES/DRESSINGS) ×2
APL PRP STRL LF DISP 70% ISPRP (MISCELLANEOUS) ×2
BACTOSHIELD CHG 4% 4OZ (MISCELLANEOUS) ×1
BLADE SURG SZ11 CARB STEEL (BLADE) ×3 IMPLANT
CATH ROBINSON RED A/P 16FR (CATHETERS) ×3 IMPLANT
CHLORAPREP W/TINT 26 (MISCELLANEOUS) ×3 IMPLANT
CORD MONOPOLAR M/FML 12FT (MISCELLANEOUS) IMPLANT
DERMABOND ADVANCED (GAUZE/BANDAGES/DRESSINGS) ×1
DERMABOND ADVANCED .7 DNX12 (GAUZE/BANDAGES/DRESSINGS) ×2 IMPLANT
GAUZE 4X4 16PLY ~~LOC~~+RFID DBL (SPONGE) ×6 IMPLANT
GLOVE BIO SURGEON STRL SZ 6.5 (GLOVE) ×3 IMPLANT
GLOVE BIO SURGEON STRL SZ8 (GLOVE) ×3 IMPLANT
GLOVE SURG UNDER LTX SZ7 (GLOVE) ×3 IMPLANT
GOWN STRL REUS W/ TWL LRG LVL3 (GOWN DISPOSABLE) ×4 IMPLANT
GOWN STRL REUS W/TWL LRG LVL3 (GOWN DISPOSABLE) ×6
GOWN STRL REUS W/TWL XL LVL4 (GOWN DISPOSABLE) ×3 IMPLANT
GRASPER SUT TROCAR 14GX15 (MISCELLANEOUS) ×1 IMPLANT
IRRIGATION STRYKERFLOW (MISCELLANEOUS) ×2 IMPLANT
IRRIGATOR STRYKERFLOW (MISCELLANEOUS) ×3
IV LACTATED RINGERS 1000ML (IV SOLUTION) ×3 IMPLANT
KIT PINK PAD W/HEAD ARE REST (MISCELLANEOUS) ×3
KIT PINK PAD W/HEAD ARM REST (MISCELLANEOUS) ×2 IMPLANT
KIT TURNOVER CYSTO (KITS) ×3 IMPLANT
MANIFOLD NEPTUNE II (INSTRUMENTS) ×3 IMPLANT
NS IRRIG 500ML POUR BTL (IV SOLUTION) ×3 IMPLANT
PACK GYN LAPAROSCOPIC (MISCELLANEOUS) ×3 IMPLANT
PAD OB MATERNITY 4.3X12.25 (PERSONAL CARE ITEMS) ×3 IMPLANT
PAD PREP 24X41 OB/GYN DISP (PERSONAL CARE ITEMS) ×3 IMPLANT
POUCH ENDO CATCH 10MM SPEC (MISCELLANEOUS) IMPLANT
SCISSORS METZENBAUM CVD 33 (INSTRUMENTS) IMPLANT
SCRUB CHG 4% DYNA-HEX 4OZ (MISCELLANEOUS) ×2 IMPLANT
SET TUBE SMOKE EVAC HIGH FLOW (TUBING) ×3 IMPLANT
SHEARS HARMONIC ACE PLUS 36CM (ENDOMECHANICALS) IMPLANT
SLEEVE ENDOPATH XCEL 5M (ENDOMECHANICALS) ×3 IMPLANT
SUT MNCRL 4-0 (SUTURE) ×3
SUT MNCRL 4-0 27XMFL (SUTURE) ×2
SUT MNCRL AB 3-0 PS2 27 (SUTURE) IMPLANT
SUT VIC AB 3-0 SH 27 (SUTURE)
SUT VIC AB 3-0 SH 27X BRD (SUTURE) IMPLANT
SUT VICRYL 0 AB UR-6 (SUTURE) ×3 IMPLANT
SUTURE MNCRL 4-0 27XMF (SUTURE) IMPLANT
SYR 50ML LL SCALE MARK (SYRINGE) ×1 IMPLANT
SYS BAG RETRIEVAL 10MM (BASKET)
SYSTEM BAG RETRIEVAL 10MM (BASKET) IMPLANT
TROCAR ENDO BLADELESS 11MM (ENDOMECHANICALS) ×3 IMPLANT
TROCAR XCEL NON-BLD 5MMX100MML (ENDOMECHANICALS) ×3 IMPLANT
TROCAR XCEL UNIV SLVE 11M 100M (ENDOMECHANICALS) ×1 IMPLANT
WATER STERILE IRR 500ML POUR (IV SOLUTION) ×3 IMPLANT

## 2022-03-12 NOTE — Anesthesia Procedure Notes (Signed)
Procedure Name: Intubation Date/Time: 03/12/2022 4:23 PM  Performed by: Fredderick Phenix, CRNAPre-anesthesia Checklist: Patient identified, Emergency Drugs available, Suction available and Patient being monitored Patient Re-evaluated:Patient Re-evaluated prior to induction Oxygen Delivery Method: Circle system utilized Preoxygenation: Pre-oxygenation with 100% oxygen Induction Type: IV induction Ventilation: Mask ventilation without difficulty Laryngoscope Size: McGraph and 4 Grade View: Grade I Tube type: Oral Tube size: 7.0 mm Number of attempts: 1 Airway Equipment and Method: Stylet and Oral airway Placement Confirmation: ETT inserted through vocal cords under direct vision, positive ETCO2 and breath sounds checked- equal and bilateral Secured at: 20 cm Tube secured with: Tape Dental Injury: Teeth and Oropharynx as per pre-operative assessment

## 2022-03-12 NOTE — Discharge Instructions (Signed)
AMBULATORY SURGERY  ?DISCHARGE INSTRUCTIONS ? ? ?The drugs that you were given will stay in your system until tomorrow so for the next 24 hours you should not: ? ?Drive an automobile ?Make any legal decisions ?Drink any alcoholic beverage ? ? ?You may resume regular meals tomorrow.  Today it is better to start with liquids and gradually work up to solid foods. ? ?You may eat anything you prefer, but it is better to start with liquids, then soup and crackers, and gradually work up to solid foods. ? ? ?Please notify your doctor immediately if you have any unusual bleeding, trouble breathing, redness and pain at the surgery site, drainage, fever, or pain not relieved by medication. ? ? ? ?Additional Instructions: ? ? ? ?Please contact your physician with any problems or Same Day Surgery at [phone], Monday through Friday 6 am to 4 pm, or [facility] [place] Main number at [phone].  ?

## 2022-03-12 NOTE — H&P (Signed)
GYNECOLOGY PREOPERATIVE HISTORY AND PHYSICAL   Subjective:  Sandra Marsh is a 26 y.o. 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   Indications for procedure include: pelvic pain, complex right ovarian cyst. Patient also desires removal of her IUD.  Notes that she has had pelvic pain not related to the cyst over the past year as well as in increase in her acne, and would like to have it removed as she believes the IUD may be the cause. No significant preoperative concerns.  Proposed surgery: Right oophorectomy, IUD removal    Pertinent Gynecological History: Menses: flow is light and usually lasting 1 to 2 days Contraception: IUD Last mammogram: not of age Last pap: normal Date: 03/05/2020   Past Medical History:  Diagnosis Date   Anxiety 07/09/2018   MDD (major depressive disorder) 09/07/2019   Ovarian cyst    PCOS (polycystic ovarian syndrome)     Past Surgical History:  Procedure Laterality Date   INTRAUTERINE DEVICE (IUD) INSERTION N/A 02/06/2021   Procedure: INTRAUTERINE DEVICE (IUD) INSERTION;  Surgeon: Rubie Maid, MD;  Location: [facility] ORS;  Service: Gynecology;  Laterality: N/A;   LAPAROSCOPIC OVARIAN CYSTECTOMY Right 02/06/2021   Procedure: LAPAROSCOPIC OVARIAN CYSTECTOMY;  Surgeon: Rubie Maid, MD;  Location: [facility] ORS;  Service: Gynecology;  Laterality: Right;    OB History  Gravida Para Term Preterm AB Living  0 0 0 0 0 0  SAB IAB Ectopic Multiple Live Births  0 0 0 0 0    Family History  Problem Relation Age of Onset   Hypertension Paternal Grandmother    Ovarian cancer Mother     Social History   Socioeconomic History   Marital status: Single    Spouse name: Not on file   Number of children: Not on file   Years of education: Not on file   Highest education level: Not on file  Occupational History   Not on file  Tobacco Use   Smoking status: Former   Smokeless tobacco: Never  Vaping Use    Vaping Use: Former   Substances: CBD, Flavoring  Substance and Sexual Activity   Alcohol use: Never   Drug use: Not Currently    Types: Marijuana   Sexual activity: Yes    Birth control/protection: Pill  Other Topics Concern   Not on file  Social History Narrative   Not on file   Social Determinants of Health   Financial Resource Strain: Not on file  Food Insecurity: Not on file  Transportation Needs: Not on file  Physical Activity: Not on file  Stress: Not on file  Social Connections: Not on file  Intimate Partner Violence: Not on file   No current facility-administered medications on file prior to encounter.   Current Outpatient Medications on File Prior to Encounter  Medication Sig Dispense Refill   buPROPion (WELLBUTRIN SR) 150 MG 12 hr tablet Take 150 mg by mouth every morning.     lamoTRIgine (LAMICTAL) 200 MG tablet Take 200 mg by mouth in the morning.     lamoTRIgine (LAMICTAL) 25 MG tablet Take 50 mg by mouth at bedtime.     Allergies  Allergen Reactions   Nickel Rash    Review of Systems Constitutional: No recent fever/chills/sweats Respiratory: No recent cough/bronchitis Cardiovascular: No chest pain Gastrointestinal: No recent nausea/vomiting/diarrhea Genitourinary: No UTI symptoms Hematologic/lymphatic:No history of coagulopathy or recent blood thinner use    Objective:   Blood pressure 112/82, pulse Marland Kitchen)  107, height 5' (1.524 m), weight 151 lb 8 oz (68.7 kg). Body mass index is 29.59 kg/m  CONSTITUTIONAL: Well-developed, well-nourished female in no acute distress.  HENT:  Normocephalic, atraumatic, External right and left ear normal. Oropharynx is clear and moist EYES: Conjunctivae and EOM are normal. Pupils are equal, round, and reactive to light. No scleral icterus.  NECK: Normal range of motion, supple, no masses SKIN: Skin is warm and dry. No rash noted. Not diaphoretic. No erythema. No pallor. NEUROLOGIC: Alert and oriented to person, place, and  time. Normal reflexes, muscle tone coordination. No cranial nerve deficit noted. PSYCHIATRIC: Normal mood and affect. Normal behavior. Normal judgment and thought content. CARDIOVASCULAR: Normal heart rate noted, regular rhythm RESPIRATORY: Effort and breath sounds normal, no problems with respiration noted ABDOMEN: Soft, nontender, nondistended. PELVIC: Deferred MUSCULOSKELETAL: Normal range of motion. No edema and no tenderness. 2+ distal pulses.    Labs: Results for orders placed or performed during the hospital encounter of 03/07/22 (from the past 336 hour(s))  Type and screen [place]   Collection Time: 03/07/22 11:49 AM  Result Value Ref Range   ABO/RH(D) O POS    Antibody Screen NEG    Sample Expiration 03/21/2022,2359    Extend sample reason      NO TRANSFUSIONS OR PREGNANCY IN THE PAST 3 MONTHS Performed at [facility], [address] [place], [place] [address]      Imaging Studies: MR PELVIS W WO CONTRAST CLINICAL DATA:  Pelvic pain, large ovarian cyst on ultrasound  EXAM: MRI PELVIS WITHOUT AND WITH CONTRAST  TECHNIQUE: Multiplanar multisequence MR imaging of the pelvis was performed both before and after administration of intravenous contrast.  CONTRAST:  27m GADAVIST GADOBUTROL 1 MMOL/ML IV SOLN  COMPARISON:  Pelvic ultrasound dated 01/24/2022 and 01/06/2021. Additionally, the patient had an in office ultrasound ([facility]) on 01/03/2022.  FINDINGS: Urinary Tract:  Bladder is within normal limits.  Bowel:  Visualized bowel is unremarkable.  Vascular/Lymphatic: No evidence of aneurysm.  No suspicious pelvic lymphadenopathy.  Reproductive:  Uterus is within normal limits.  Left ovary is within normal limits (series 3/image 16).  9.8 x 7.0 x 8.9 cm predominantly cystic right ovarian mass (series 3/image 19). Along the left lateral aspect of the lesion, there is intrinsic T1 hyperintensity/hemorrhage  (series 3/image 33). Layering posteriorly, there is a 9 mm enhancing mural nodule (series 36/image 32). 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  Other:  Trace pelvic ascites.  Musculoskeletal: Insert osseous  IMPRESSION: 9.8 cm complex cystic right ovarian mass, as described above, with enhancing mural nodularity. This lesion is persistent and mildly progressive from 2022, continuing to raise concern for an ovarian neoplasm, possibly borderline malignant. Patient has a recent prior OB-GYN ultrasound. Referral for GYN surgical consultation is suggested.  Electronically Signed   By: SJulian HyM.D.   On: 01/24/2022 23:05 [place]LV[place] COMPLETE W TRANSVAGINAL AND TORSION R/O CLINICAL DATA:  Pelvic pain for 5 days. Intrauterine device present. [id number] EXAM: TRANSABDOMINAL AND TRANSVAGINAL ULTRASOUND OF PELVIS  TECHNIQUE: Both transabdominal and transvaginal ultrasound examinations of the pelvis were performed. Transabdominal technique was performed for global imaging of the pelvis including uterus, ovaries, adnexal regions, and pelvic cul-de-sac. It was necessary to proceed with endovaginal exam following the transabdominal exam to visualize the ovaries and endometrium.  COMPARISON:  01/03/2022  FINDINGS: Uterus  Measurements: 8 x 3.1 x 5.2 cm =  volume: 68 mL. Mildly anteverted. No fibroids or other mass visualized.  Endometrium  Thickness: 5 mm. Linear echogenic stripe consistent with endometrial placement of intrauterine device. Appropriate positioning is suggested.  Right ovary  Measurements: 7.5 x 7.1 x 8.7 cm = volume: 247 mL. There is a large right adnexal mass measuring 6.7 x 7 x 8 cm. The mass is predominantly cystic in appearance with diffuse internal echoes. There is a solid nodular component measuring 0.8 x 1.8 x 1.5 cm. There is flow demonstrated in the  nodular component on color flow Doppler imaging. There is a separate complex area measuring 3.7 x 2.9 x 1.7 cm within the right ovary.  Left ovary  Measurements: 2.7 x 1.5 x 2.1 cm = volume: 5 mL. Normal appearance/no adnexal mass.  Other findings  Small amount of free fluid in the pelvis.  IMPRESSION: 1. 8 cm diameter complex cyst in the right adnexum with diffuse internal echoes and nodular components with color flow Doppler signal indicated. Since the previous study, the internal echoes have increased. Size is similar. This represents an indeterminate lesion with suspicious features. Recommend MRI or surgical consultation for further evaluation. 2. Uterus and left ovary are normal. 3. Intrauterine device is in place.  Electronically Signed   By: Lucienne Capers M.D.   On: 01/24/2022 18:26    Assessment:    Right ovarian cyst Pelvic pain  IUD in place, desires removal   Plan:   Counseling: Procedure, risks, reasons, benefits and complications (including injury to bowel, bladder, major blood vessel, ureter, bleeding, possibility of transfusion, infection, or fistula formation) reviewed in detail. Likelihood of success in alleviating the patient's condition was discussed. Routine postoperative instructions will be reviewed with the patient and her family in detail after surgery.  The patient concurred with the proposed plan, giving informed written consent for the surgery.   Preop testing reviewed. Has been NPO since midnight. Will plan to initiate OCPs after IUD removal.    Rubie Maid, MD [facility]

## 2022-03-12 NOTE — Op Note (Signed)
Procedure(s): LAPAROSCOPIC OOPHORECTOMY INTRAUTERINE DEVICE (IUD) REMOVAL Procedure Note  Sandra Marsh female 26 y.o. 03/12/2022  Indications: The patient is a 26 y.o. G0P0000 female with pelvic pain, recurrent right ovarian cyst, IUD in place desiring removal. Patient also with family history of ovarian cancer.  Pre-operative Diagnosis: Pelvic pain, recurrent right ovarian cyst (8 cm on recent ultrasound), IUD in place desiring removal.  Post-operative Diagnosis: Same  Surgeon: Rubie Maid, MD  Assistants:  Jeannie Fend, MD.   Anesthesia: General endotracheal anesthesia  Findings: The uterus was sounded to 8.  IUD threads not visible at cervical os.  Fallopian tubes and left ovary appeared normal. Right ovary with large adnexal cyst.  Procedure Details: The patient was seen in the Holding Room. The risks, benefits, complications, treatment options, and expected outcomes were discussed with the patient.  The patient concurred with the proposed plan, giving informed consent.  The site of surgery properly noted/marked. The patient was taken to the Operating Room, identified as Sandra Marsh and the procedure verified as Procedure(s) (LRB): LAPAROSCOPIC OOPHORECTOMY (Right) INTRAUTERINE DEVICE (IUD) REMOVAL (N/A).   She was then placed under general anesthesia without difficulty. She was placed in the dorsal lithotomy position, and was prepped and draped in a sterile manner. A Time Out was held and the above information confirmed.  A straight catheterization was performed. A sterile speculum was inserted into the vagina and the cervix was grasped at the anterior lip using a single-toothed tenaculum.  The uterus was sounded to 8 cm, and a Hulka clamp was placed for uterine manipulation.  The speculum and tenaculum were then removed. After an adequate timeout was performed, attention was turned to the abdomen where an umbilical incision was made with the scalpel.  The Optiview 5-mm trocar  and sleeve were then advanced without difficulty with the laparoscope under direct visualization into the abdomen.  The abdomen was then insufflated with carbon dioxide gas and adequate pneumoperitoneum was obtained. A 5-mm right lower quadrant port and an 11-mm left lower quadrant port were then placed under direct visualization.  A survey of the patient's pelvis and abdomen revealed the findings as above.  On the right side, the infundibulopelvic ligament was clamped and transected with the Harmonic device. The mesosalpinx of the right fallopian tube was then transected to separate it from the the ovarian mass and retain attachment to the uterus.  An Endocatch bag was then inserted into the 11 mm trochar however the mass was noted to be too large to remove.  The decision was made to aspirate the cyst.  A total of 180 ml of serous fluid was drained from the cyst until it was able to be contained within the Endocatch bag.  The right ovary was removed (additional drainage was required after bag was brought to the incision site, but this fluid was not accounted for).  The fascia of the 11-mm incision was then approximated using 0-Vicryl with a cone and PMI device.   The pneumoperitoneum was then re-established, and the laparoscope was then introduced once again into the abdominal cavity.  A final survey was performed, where good hemostasis was noted on the right side and throughout the pelvis. All trocars were removed under direct visualization, and the abdomen which was desufflated.    All skin incisions were closed with 4-0 Monocryl subcuticular stitches. Dermabond was placed over the incisions.  A total of 15 ml of 0.5% Marcaine was injected into the incisions. The patient tolerated the procedures well.  All instruments, needles, and sponge counts were correct x 2. The patient was taken to the recovery room awake, extubated and in stable condition.   An experienced assistant was required given the standard of  surgical care given the complexity of the case.  This assistant was needed for exposure, dissection, suctioning, retraction, instrument exchange, and for overall help during the procedure.   Estimated Blood Loss:  minimal      Drains: straight catheterization prior to procedure with 20 ml of clear urine         Total IV Fluids: 1000 ml  Specimens: Right ovary with cyst         Implants: None         Complications:  None; patient tolerated the procedure well.         Disposition: PACU - hemodynamically stable.         Condition: stable   Rubie Maid, MD [facility]

## 2022-03-12 NOTE — Anesthesia Preprocedure Evaluation (Addendum)
Anesthesia Evaluation  Patient identified by MRN, date of birth, ID band Patient awake    Reviewed: Allergy & Precautions, NPO status , Patient's Chart, lab work & pertinent test results  Airway Mallampati: III  TM Distance: >3 FB Neck ROM: full    Dental  (+) Chipped   Pulmonary neg pulmonary ROS, former smoker,    Pulmonary exam normal        Cardiovascular negative cardio ROS Normal cardiovascular exam     Neuro/Psych PSYCHIATRIC DISORDERS Anxiety Depression negative neurological ROS     GI/Hepatic negative GI ROS, Neg liver ROS,   Endo/Other  negative endocrine ROS  Renal/GU      Musculoskeletal   Abdominal   Peds  Hematology negative hematology ROS (+)   Anesthesia Other Findings Past Medical History: 07/09/2018: Anxiety 09/07/2019: MDD (major depressive disorder) No date: Ovarian cyst No date: PCOS (polycystic ovarian syndrome)  Past Surgical History: 02/06/2021: INTRAUTERINE DEVICE (IUD) INSERTION; N/A     Comment:  Procedure: INTRAUTERINE DEVICE (IUD) INSERTION;                Surgeon: Rubie Maid, MD;  Location: [facility] ORS;                Service: Gynecology;  Laterality: N/A; 02/06/2021: LAPAROSCOPIC OVARIAN CYSTECTOMY; Right     Comment:  Procedure: LAPAROSCOPIC OVARIAN CYSTECTOMY;  Surgeon:               Rubie Maid, MD;  Location: [facility] ORS;  Service:               Gynecology;  Laterality: Right;  BMI    Body Mass Index: 28.32 kg/m      Reproductive/Obstetrics negative OB ROS                             Anesthesia Physical Anesthesia Plan  ASA: 1  Anesthesia Plan: General   Post-op Pain Management:    Induction:   PONV Risk Score and Plan: Ondansetron, Dexamethasone, Midazolam and Treatment may vary due to age or medical condition  Airway Management Planned:   Additional Equipment:   Intra-op Plan:   Post-operative Plan:   Informed Consent: I have  reviewed the patients History and Physical, chart, labs and discussed the procedure including the risks, benefits and alternatives for the proposed anesthesia with the patient or authorized representative who has indicated his/her understanding and acceptance.     Dental Advisory Given  Plan Discussed with: Anesthesiologist, CRNA and Surgeon  Anesthesia Plan Comments:         Anesthesia Quick Evaluation

## 2022-03-12 NOTE — Transfer of Care (Signed)
Immediate Anesthesia Transfer of Care Note  Patient: Sandra Marsh  Procedure(s) Performed: LAPAROSCOPIC OOPHORECTOMY (Right: Abdomen) INTRAUTERINE DEVICE (IUD) REMOVAL (Uterus)  Patient Location: PACU  Anesthesia Type:General  Level of Consciousness: drowsy  Airway & Oxygen Therapy: Patient Spontanous Breathing and Patient connected to face mask oxygen  Post-op Assessment: Report given to RN  Post vital signs: stable  Last Vitals:  Vitals Value Taken Time  BP 118/75 03/12/22 1745  Temp 36.4 C 03/12/22 1745  Pulse 80 03/12/22 1748  Resp 18 03/12/22 1748  SpO2 100 % 03/12/22 1748  Vitals shown include unvalidated device data.  Last Pain:  Vitals:   03/12/22 1745  TempSrc:   PainSc: Asleep         Complications: No notable events documented.

## 2022-03-13 ENCOUNTER — Encounter: Payer: Self-pay | Admitting: Obstetrics and Gynecology

## 2022-03-13 ENCOUNTER — Telehealth: Payer: Self-pay | Admitting: Obstetrics and Gynecology

## 2022-03-13 MED ORDER — OXYCODONE-ACETAMINOPHEN 5-325 MG PO TABS: 1.0000 | ORAL_TABLET | Freq: Four times a day (QID) | ORAL | 0 refills | Status: DC | PRN

## 2022-03-13 MED ORDER — IBUPROFEN 800 MG PO TABS: 800.0000 mg | ORAL_TABLET | Freq: Three times a day (TID) | ORAL | 1 refills | Status: DC | PRN

## 2022-03-13 NOTE — Anesthesia Postprocedure Evaluation (Signed)
Anesthesia Post Note  Patient: Sandra Marsh  Procedure(s) Performed: LAPAROSCOPIC OOPHORECTOMY (Right: Abdomen) INTRAUTERINE DEVICE (IUD) REMOVAL (Uterus)  Patient location during evaluation: PACU Anesthesia Type: General Level of consciousness: awake and alert Pain management: pain level controlled Vital Signs Assessment: post-procedure vital signs reviewed and stable Respiratory status: spontaneous breathing, nonlabored ventilation and respiratory function stable Cardiovascular status: blood pressure returned to baseline and stable Postop Assessment: no apparent nausea or vomiting Anesthetic complications: no   No notable events documented.   Last Vitals:  Vitals:   03/12/22 1835 03/12/22 1843  BP:  121/83  Pulse: 70 85  Resp: 14 20  Temp:  (!) 36.1 C  SpO2: 100% 100%    Last Pain:  Vitals:   03/12/22 1843  TempSrc: Temporal  PainSc: 2                  [place] Ouch

## 2022-03-13 NOTE — Telephone Encounter (Signed)
Pt is asking for the RX to be resent to the pharmacy. Pharmacy states it's computer was down and unable to receive the order.

## 2022-03-13 NOTE — Telephone Encounter (Signed)
Please inform that I have resubmitted prescriptions to her pharmacy as requested.   Dr. Marcelline Mates

## 2022-03-14 LAB — SURGICAL PATHOLOGY

## 2022-03-16 ENCOUNTER — Encounter: Payer: Self-pay | Admitting: Obstetrics and Gynecology

## 2022-03-21 ENCOUNTER — Encounter: Payer: Self-pay | Admitting: Obstetrics and Gynecology

## 2022-03-27 NOTE — Progress Notes (Unsigned)
    OBSTETRICS/GYNECOLOGY POST-OPERATIVE CLINIC VISIT  Subjective:     Sandra Marsh is a 26 y.o. female who presents to the clinic {1-10:13787} weeks status post {gyn surgeries:13997} for {gyn surg indications maj:13998}. Eating a regular diet {with-without:5700} difficulty. Bowel movements are {normal/abnormal***:19619}. {pain control:13522::"The patient is not having any pain."}  {Common ambulatory SmartLinks:19316}  Review of Systems {ros; complete:30496}   Objective:   There were no vitals taken for this visit. There is no height or weight on file to calculate BMI.  General:  alert and no distress  Abdomen: soft, bowel sounds active, non-tender  Incision:   {incision:13716::"no dehiscence","incision well approximated","healing well","no drainage","no erythema","no hernia","no seroma","no swelling"}    Pathology:    Assessment:   Patient s/p *** (surgery)  {doing well:13525::"Doing well postoperatively."}   Plan:   1. Continue any current medications as instructed by provider. 2. Wound care discussed. 3. Operative findings again reviewed. Pathology report discussed. 4. Activity restrictions: {restrictions:13723} 5. Anticipated return to work: {work return:14002}. 6. Follow up: {7-11:65790} {time; units:18646} for ***    Landis Gandy, CMA [facility]

## 2022-03-28 ENCOUNTER — Encounter: Payer: Self-pay | Admitting: Obstetrics and Gynecology

## 2022-03-28 ENCOUNTER — Other Ambulatory Visit (HOSPITAL_COMMUNITY)
Admission: RE | Admit: 2022-03-28 | Discharge: 2022-03-28 | Disposition: A | Discharge status: Home / Self Care | Payer: BC Managed Care – PPO | Source: Ambulatory Visit | Attending: Obstetrics and Gynecology | Admitting: Obstetrics and Gynecology

## 2022-03-28 ENCOUNTER — Ambulatory Visit (INDEPENDENT_AMBULATORY_CARE_PROVIDER_SITE_OTHER): Payer: BC Managed Care – PPO | Admitting: Obstetrics and Gynecology

## 2022-03-28 VITALS — BP 122/87 | HR 98 | Resp 16 | Wt 146.5 lb

## 2022-03-28 DIAGNOSIS — Z113 Encounter for screening for infections with a predominantly sexual mode of transmission: Secondary | ICD-10-CM

## 2022-03-28 DIAGNOSIS — Z90721 Acquired absence of ovaries, unilateral: Secondary | ICD-10-CM

## 2022-03-29 LAB — CERVICOVAGINAL ANCILLARY ONLY
Chlamydia: NEGATIVE
Comment: NEGATIVE
Comment: NORMAL
Neisseria Gonorrhea: NEGATIVE

## 2022-04-10 ENCOUNTER — Encounter: Payer: BC Managed Care – PPO | Admitting: Certified Nurse Midwife

## 2022-04-11 DIAGNOSIS — F603 Borderline personality disorder: Secondary | ICD-10-CM | POA: Diagnosis not present

## 2022-04-25 DIAGNOSIS — F603 Borderline personality disorder: Secondary | ICD-10-CM | POA: Diagnosis not present

## 2022-05-03 DIAGNOSIS — F411 Generalized anxiety disorder: Secondary | ICD-10-CM | POA: Diagnosis not present

## 2022-05-03 DIAGNOSIS — F5105 Insomnia due to other mental disorder: Secondary | ICD-10-CM | POA: Diagnosis not present

## 2022-05-03 DIAGNOSIS — F39 Unspecified mood [affective] disorder: Secondary | ICD-10-CM | POA: Diagnosis not present

## 2022-05-03 DIAGNOSIS — F902 Attention-deficit hyperactivity disorder, combined type: Secondary | ICD-10-CM | POA: Diagnosis not present

## 2022-05-10 ENCOUNTER — Encounter: Payer: BC Managed Care – PPO | Admitting: Certified Nurse Midwife

## 2022-06-20 ENCOUNTER — Ambulatory Visit: Payer: BC Managed Care – PPO | Admitting: Dermatology

## 2022-06-27 DIAGNOSIS — F603 Borderline personality disorder: Secondary | ICD-10-CM | POA: Diagnosis not present

## 2022-07-11 DIAGNOSIS — F603 Borderline personality disorder: Secondary | ICD-10-CM | POA: Diagnosis not present

## 2022-07-25 DIAGNOSIS — F603 Borderline personality disorder: Secondary | ICD-10-CM | POA: Diagnosis not present

## 2022-07-31 DIAGNOSIS — F1211 Cannabis abuse, in remission: Secondary | ICD-10-CM | POA: Diagnosis not present

## 2022-07-31 DIAGNOSIS — F39 Unspecified mood [affective] disorder: Secondary | ICD-10-CM | POA: Diagnosis not present

## 2022-07-31 DIAGNOSIS — F411 Generalized anxiety disorder: Secondary | ICD-10-CM | POA: Diagnosis not present

## 2022-07-31 DIAGNOSIS — F902 Attention-deficit hyperactivity disorder, combined type: Secondary | ICD-10-CM | POA: Diagnosis not present

## 2022-08-01 DIAGNOSIS — F603 Borderline personality disorder: Secondary | ICD-10-CM | POA: Diagnosis not present

## 2022-08-15 ENCOUNTER — Other Ambulatory Visit (HOSPITAL_COMMUNITY)
Admission: RE | Admit: 2022-08-15 | Discharge: 2022-08-15 | Disposition: A | Discharge status: Home / Self Care | Payer: BC Managed Care – PPO | Source: Ambulatory Visit | Attending: Obstetrics and Gynecology | Admitting: Obstetrics and Gynecology

## 2022-08-15 ENCOUNTER — Ambulatory Visit (INDEPENDENT_AMBULATORY_CARE_PROVIDER_SITE_OTHER): Payer: BC Managed Care – PPO

## 2022-08-15 VITALS — Ht 60.0 in | Wt 149.0 lb

## 2022-08-15 DIAGNOSIS — Z202 Contact with and (suspected) exposure to infections with a predominantly sexual mode of transmission: Secondary | ICD-10-CM | POA: Diagnosis not present

## 2022-08-15 NOTE — Progress Notes (Signed)
Patient would like STD testing, unknown exposure, found out boyfriend cheated on her. Denies any vaginal discharge, odor, vaginal pain or bleeding/spotting.

## 2022-08-16 LAB — CERVICOVAGINAL ANCILLARY ONLY
Chlamydia: NEGATIVE
Comment: NEGATIVE
Comment: NEGATIVE
Comment: NORMAL
Neisseria Gonorrhea: NEGATIVE
Trichomonas: NEGATIVE

## 2022-08-30 DIAGNOSIS — F603 Borderline personality disorder: Secondary | ICD-10-CM | POA: Diagnosis not present

## 2022-09-05 DIAGNOSIS — F411 Generalized anxiety disorder: Secondary | ICD-10-CM | POA: Diagnosis not present

## 2022-09-05 DIAGNOSIS — F1211 Cannabis abuse, in remission: Secondary | ICD-10-CM | POA: Diagnosis not present

## 2022-09-05 DIAGNOSIS — F902 Attention-deficit hyperactivity disorder, combined type: Secondary | ICD-10-CM | POA: Diagnosis not present

## 2022-09-05 DIAGNOSIS — F39 Unspecified mood [affective] disorder: Secondary | ICD-10-CM | POA: Diagnosis not present

## 2022-09-12 DIAGNOSIS — F603 Borderline personality disorder: Secondary | ICD-10-CM | POA: Diagnosis not present

## 2022-09-13 DIAGNOSIS — J029 Acute pharyngitis, unspecified: Secondary | ICD-10-CM | POA: Diagnosis not present

## 2022-09-13 DIAGNOSIS — Z6829 Body mass index (BMI) 29.0-29.9, adult: Secondary | ICD-10-CM | POA: Diagnosis not present

## 2022-10-21 DIAGNOSIS — Z6828 Body mass index (BMI) 28.0-28.9, adult: Secondary | ICD-10-CM | POA: Diagnosis not present

## 2022-10-21 DIAGNOSIS — M791 Myalgia, unspecified site: Secondary | ICD-10-CM | POA: Diagnosis not present

## 2022-10-21 DIAGNOSIS — U071 COVID-19: Secondary | ICD-10-CM | POA: Diagnosis not present

## 2022-11-14 DIAGNOSIS — F603 Borderline personality disorder: Secondary | ICD-10-CM | POA: Diagnosis not present

## 2022-12-05 DIAGNOSIS — F603 Borderline personality disorder: Secondary | ICD-10-CM | POA: Diagnosis not present

## 2022-12-05 DIAGNOSIS — F509 Eating disorder, unspecified: Secondary | ICD-10-CM | POA: Diagnosis not present

## 2022-12-05 DIAGNOSIS — F902 Attention-deficit hyperactivity disorder, combined type: Secondary | ICD-10-CM | POA: Diagnosis not present

## 2022-12-05 DIAGNOSIS — F411 Generalized anxiety disorder: Secondary | ICD-10-CM | POA: Diagnosis not present

## 2022-12-05 DIAGNOSIS — F39 Unspecified mood [affective] disorder: Secondary | ICD-10-CM | POA: Diagnosis not present

## 2022-12-17 DIAGNOSIS — M9901 Segmental and somatic dysfunction of cervical region: Secondary | ICD-10-CM | POA: Diagnosis not present

## 2022-12-17 DIAGNOSIS — M9903 Segmental and somatic dysfunction of lumbar region: Secondary | ICD-10-CM | POA: Diagnosis not present

## 2022-12-17 DIAGNOSIS — M9906 Segmental and somatic dysfunction of lower extremity: Secondary | ICD-10-CM | POA: Diagnosis not present

## 2022-12-17 DIAGNOSIS — M62462 Contracture of muscle, left lower leg: Secondary | ICD-10-CM | POA: Diagnosis not present

## 2022-12-17 DIAGNOSIS — R293 Abnormal posture: Secondary | ICD-10-CM | POA: Diagnosis not present

## 2022-12-17 DIAGNOSIS — M62830 Muscle spasm of back: Secondary | ICD-10-CM | POA: Diagnosis not present

## 2022-12-19 DIAGNOSIS — M9901 Segmental and somatic dysfunction of cervical region: Secondary | ICD-10-CM | POA: Diagnosis not present

## 2022-12-19 DIAGNOSIS — F603 Borderline personality disorder: Secondary | ICD-10-CM | POA: Diagnosis not present

## 2022-12-19 DIAGNOSIS — M62830 Muscle spasm of back: Secondary | ICD-10-CM | POA: Diagnosis not present

## 2022-12-19 DIAGNOSIS — M9906 Segmental and somatic dysfunction of lower extremity: Secondary | ICD-10-CM | POA: Diagnosis not present

## 2022-12-19 DIAGNOSIS — M62462 Contracture of muscle, left lower leg: Secondary | ICD-10-CM | POA: Diagnosis not present

## 2022-12-19 DIAGNOSIS — R293 Abnormal posture: Secondary | ICD-10-CM | POA: Diagnosis not present

## 2022-12-19 DIAGNOSIS — M9903 Segmental and somatic dysfunction of lumbar region: Secondary | ICD-10-CM | POA: Diagnosis not present

## 2022-12-20 IMAGING — MR MR PELVIS WO/W CM
33 of 39 series · 39 of 48 positions shown · IV contrast (7ml Gadavist)
Comparison: Pelvic ultrasound dated 01/24/2022 and 01/06/2021.

CLINICAL DATA: Pelvic pain, large ovarian cyst on ultrasound

EXAM:
MRI PELVIS WITHOUT AND WITH CONTRAST
TECHNIQUE: Multiplanar multisequence MR imaging of the pelvis was performed
both before and after administration of intravenous contrast.
CONTRAST:  7mL GADAVIST GADOBUTROL 1 MMOL/ML IV SOLN

[Series 2: T2 · coronal · 5.0mm · 1.56mm/px · 2 of 30 slices shown (1 of 6)]
[im 1/30]
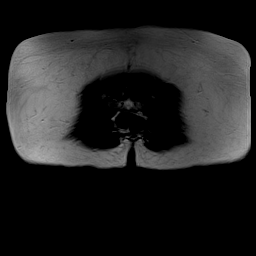
[im 30/30]
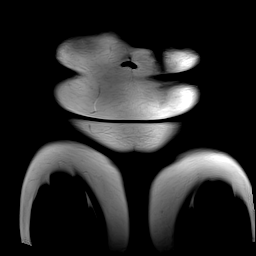

[Series 3: T2 · axial · 5.0mm · 0.47mm/px · z∈[-193,-19]mm · 2 of 30 slices shown (2 of 6)]
[im 1/30]
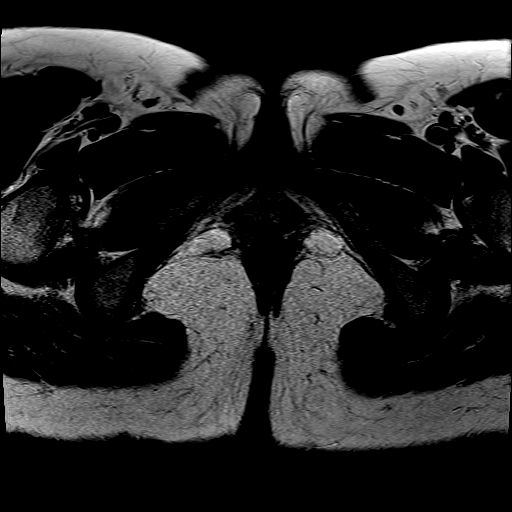
[im 30/30]
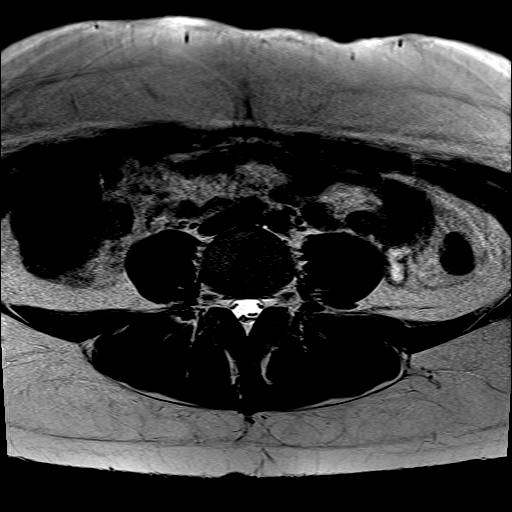

[Series 4: T2 · axial · 5.0mm · 0.47mm/px · z∈[-193,-19]mm · 2 of 30 slices shown (3 of 6)]
[im 1/30]
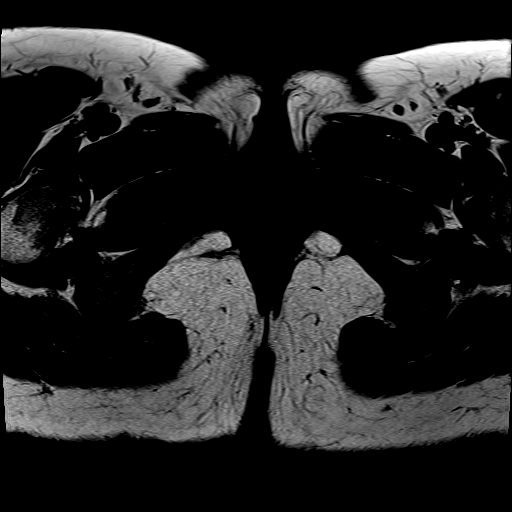
[im 30/30]
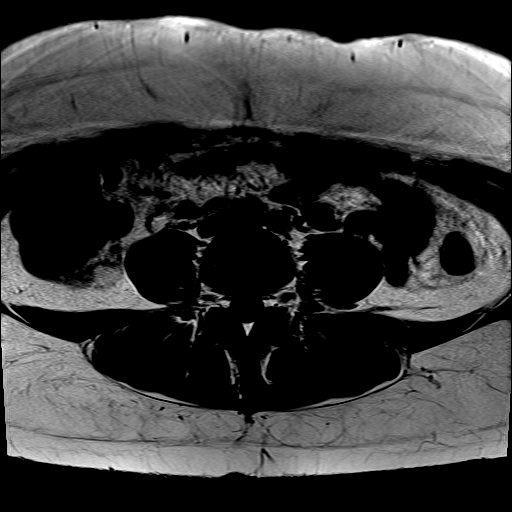

[Series 5: T2 · axial · 5.0mm · 0.47mm/px · z∈[-193,-19]mm · 2 of 30 slices shown (4 of 6)]
[im 1/30]
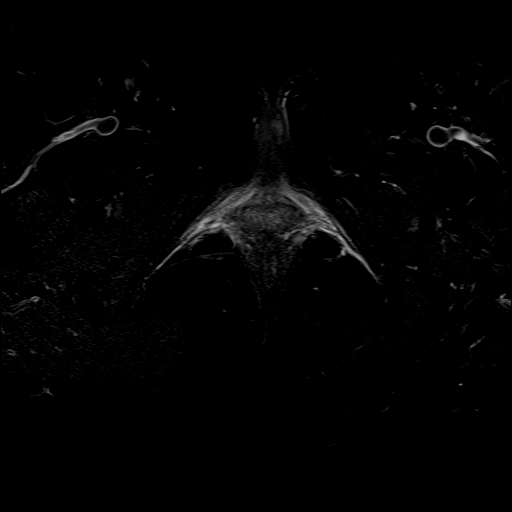
[im 30/30]
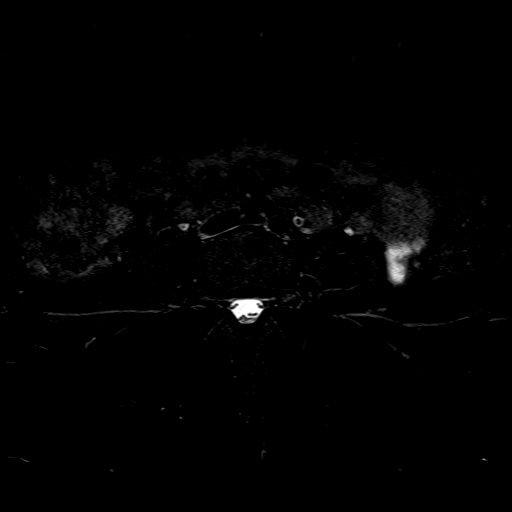

[Series 6: T2 · sagittal · 5.0mm · 0.75mm/px · 2 of 33 slices shown (5 of 6)]
[im 1/33]
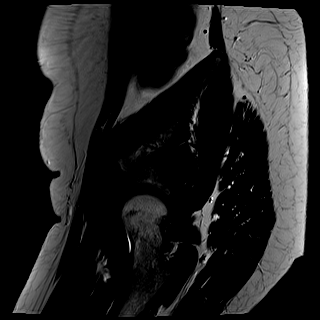
[im 33/33]
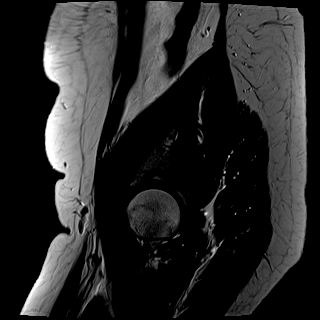

[Series 7: T2 · coronal · 5.0mm · 0.88mm/px · 1 of 33 slices shown (6 of 6)]
[im 1/33]
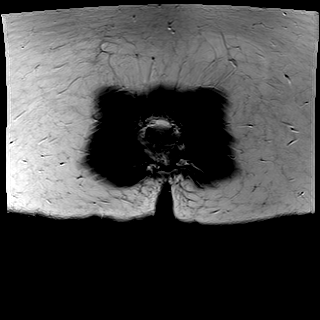

[Series 8: ax dwi_tracew · axial · 5.0mm · 1.42mm/px · z∈[-205,-7]mm · 2 of 102 slices shown]
[im 1/102]
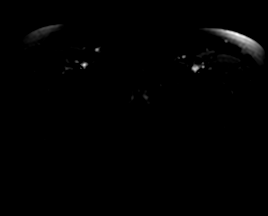
[im 102/102]
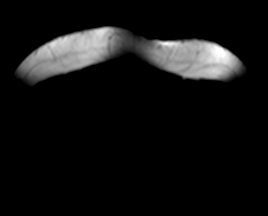

[Series 9: ax dwi_adc · axial · 5.0mm · 1.42mm/px · 1 of 34 slices shown]
[im 1/34]
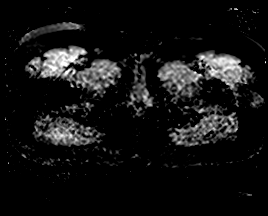

[Series 10: T1 dynamic · axial · 4.0mm · 0.75mm/px · 1 of 52 slices shown (1 of 25)]
[im 1/52]
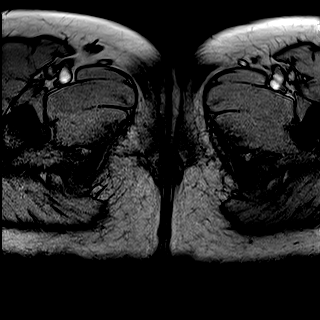

[Series 11: T1 dynamic · axial · 4.0mm · 0.75mm/px · 1 of 52 slices shown (2 of 25)]
[im 1/52]
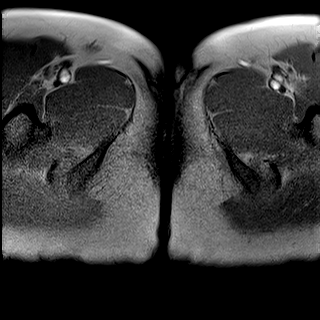

[Series 12: T1 dynamic · axial · 4.0mm · 0.75mm/px · 1 of 52 slices shown (3 of 25)]
[im 1/52]
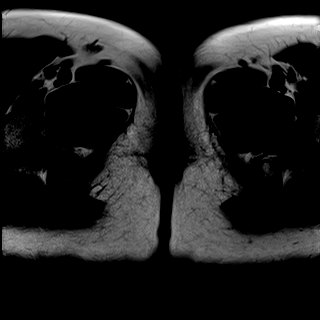

[Series 13: T1 dynamic · axial · 4.0mm · 0.75mm/px · 1 of 52 slices shown (4 of 25)]
[im 1/52]
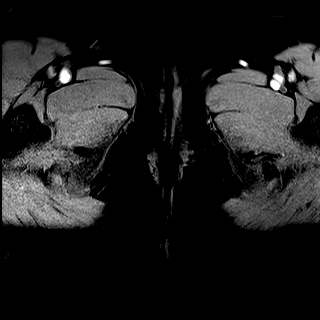

[Series 14: T1 dynamic · axial · 4.0mm · 0.75mm/px · 1 of 52 slices shown (5 of 25)]
[im 1/52]
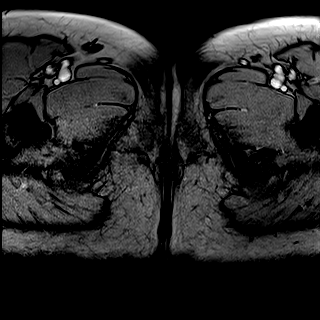

[Series 15: T1 dynamic · axial · 4.0mm · 0.75mm/px · 1 of 52 slices shown (6 of 25)]
[im 1/52]
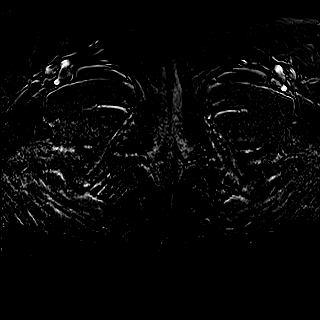

[Series 16: T1 dynamic · axial · 4.0mm · 0.75mm/px · 1 of 52 slices shown (7 of 25)]
[im 1/52]
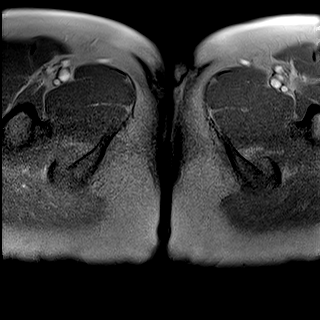

[Series 17: T1 dynamic · axial · 4.0mm · 0.75mm/px · 1 of 52 slices shown (8 of 25)]
[im 1/52]
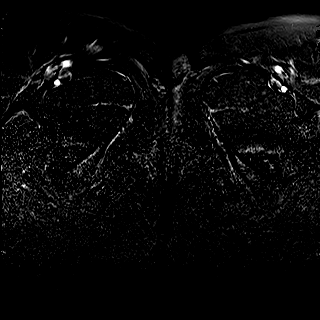

[Series 18: T1 dynamic · axial · 4.0mm · 0.75mm/px · 1 of 52 slices shown (9 of 25)]
[im 1/52]
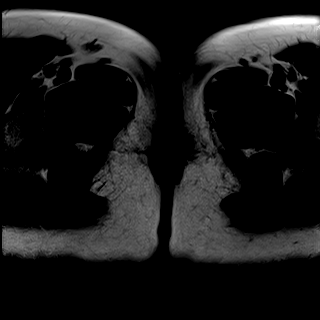

[Series 19: T1 dynamic · axial · 4.0mm · 0.75mm/px · 1 of 50 slices shown (10 of 25)]
[im 1/50]
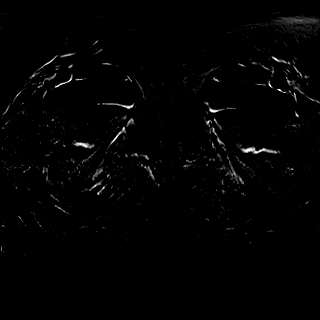

[Series 20: T1 dynamic · axial · 4.0mm · 0.75mm/px · 1 of 52 slices shown (11 of 25)]
[im 1/52]
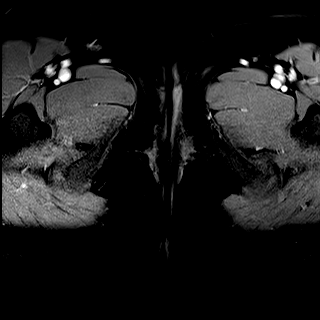

[Series 21: T1 dynamic · axial · 4.0mm · 0.75mm/px · 1 of 52 slices shown (12 of 25)]
[im 1/52]
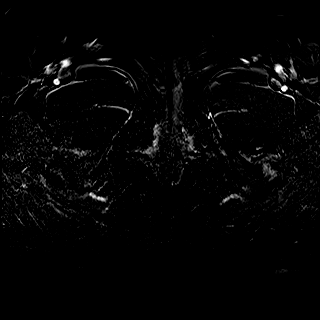

[Series 22: T1 dynamic · axial · 4.0mm · 0.75mm/px · 1 of 52 slices shown (13 of 25)]
[im 1/52]
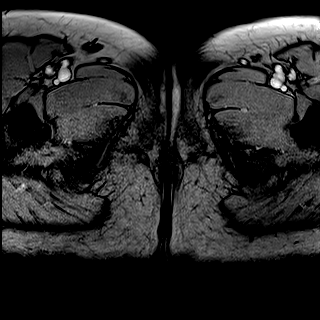

[Series 23: T1 dynamic · axial · 4.0mm · 0.75mm/px · 1 of 52 slices shown (14 of 25)]
[im 1/52]
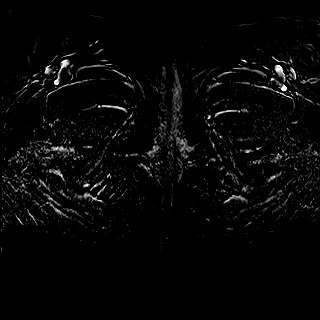

[Series 24: T1 dynamic · axial · 4.0mm · 0.75mm/px · 1 of 52 slices shown (15 of 25)]
[im 1/52]
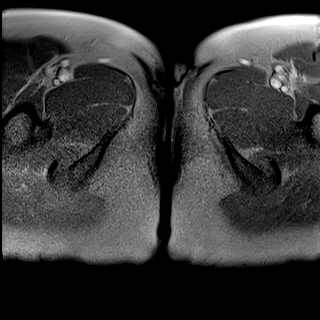

[Series 25: T1 dynamic · axial · 4.0mm · 0.75mm/px · 1 of 52 slices shown (16 of 25)]
[im 1/52]
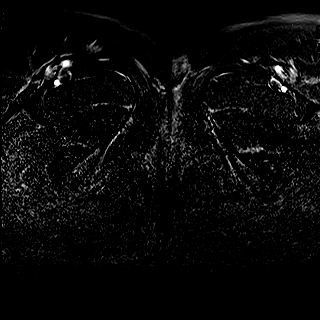

[Series 26: T1 dynamic · axial · 4.0mm · 0.75mm/px · 1 of 52 slices shown (17 of 25)]
[im 1/52]
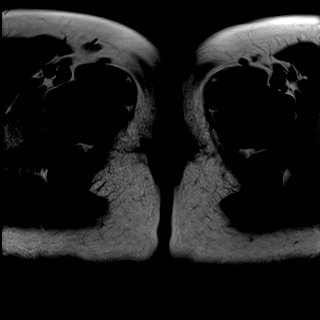

[Series 27: T1 dynamic · axial · 4.0mm · 0.75mm/px · 1 of 49 slices shown (18 of 25)]
[im 1/49]
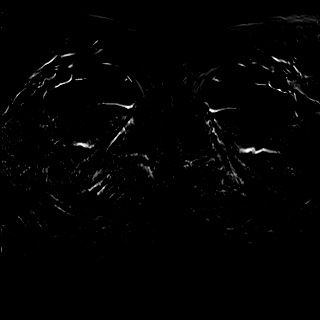

[Series 28: T1 dynamic · axial · 4.0mm · 0.75mm/px · 1 of 52 slices shown (19 of 25)]
[im 1/52]
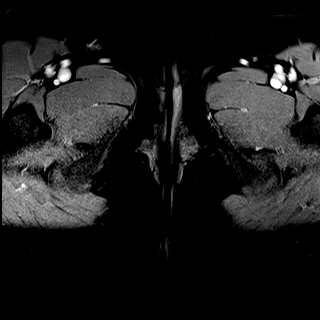

[Series 29: T1 dynamic · axial · 4.0mm · 0.75mm/px · 1 of 52 slices shown (20 of 25)]
[im 1/52]
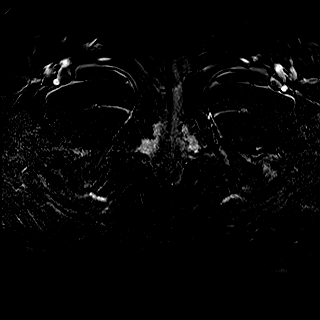

[Series 30: T1 dynamic · axial · 4.0mm · 0.75mm/px · 1 of 52 slices shown (21 of 25)]
[im 1/52]
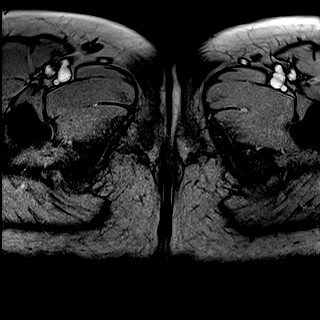

[Series 31: T1 dynamic · axial · 4.0mm · 0.75mm/px · 1 of 52 slices shown (22 of 25)]
[im 1/52]
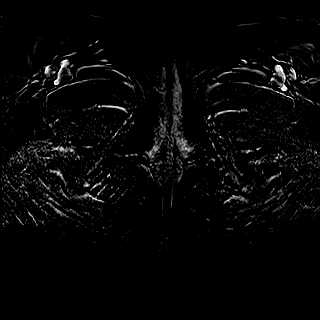

[Series 32: T1 dynamic · axial · 4.0mm · 0.75mm/px · 1 of 52 slices shown (23 of 25)]
[im 1/52]
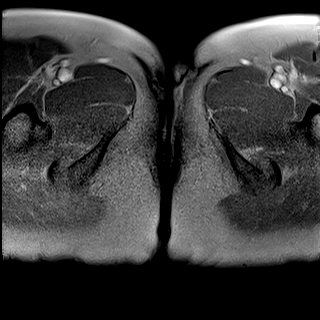

[Series 33: T1 dynamic · axial · 4.0mm · 0.75mm/px · 1 of 52 slices shown (24 of 25)]
[im 1/52]
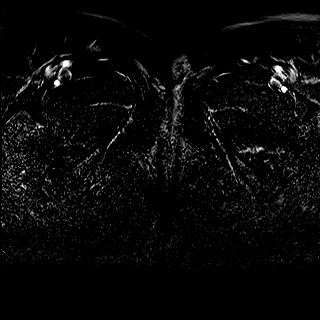

[Series 34: T1 dynamic · axial · 4.0mm · 0.75mm/px · 1 of 52 slices shown (25 of 25)]
[im 1/52]
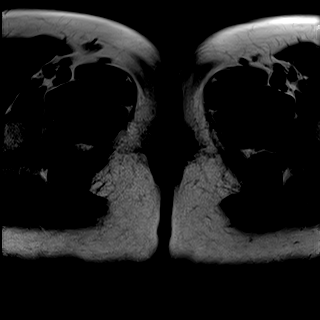

[39 of 48 positions shown; findings below may reference images not displayed]

Additionally, the patient had an in office ultrasound ([REDACTED]) on 01/03/2022.
FINDINGS: Urinary Tract:  Bladder is within normal limits.

Bowel:  Visualized bowel is unremarkable.

Vascular/Lymphatic: No evidence of aneurysm.

No suspicious pelvic lymphadenopathy.

Reproductive:  Uterus is within normal limits.

Left ovary is within normal limits (series 3/image 16).

9.8 x 7.0 x 8.9 cm predominantly cystic right ovarian mass (series
3/image 19). Along the left lateral aspect of the lesion, there is
intrinsic T1 hyperintensity/hemorrhage (series 3/image 33). Layering
posteriorly, there is a 9 mm enhancing mural nodule (series 36/image
32). This lesion is persistent and mildly progressive from 6466,
raising concern for an ovarian neoplasm, possibly borderline
malignant.

Other:  Trace pelvic ascites.

Musculoskeletal: Insert osseous
IMPRESSION: 9.8 cm complex cystic right ovarian mass, as described above, with
enhancing mural nodularity. This lesion is persistent and mildly
progressive from 6466, continuing to raise concern for an ovarian
neoplasm, possibly borderline malignant. Patient has a recent prior
OB-GYN ultrasound. Referral for GYN surgical consultation is
suggested.

## 2022-12-24 DIAGNOSIS — M9901 Segmental and somatic dysfunction of cervical region: Secondary | ICD-10-CM | POA: Diagnosis not present

## 2022-12-24 DIAGNOSIS — M62462 Contracture of muscle, left lower leg: Secondary | ICD-10-CM | POA: Diagnosis not present

## 2022-12-24 DIAGNOSIS — M9906 Segmental and somatic dysfunction of lower extremity: Secondary | ICD-10-CM | POA: Diagnosis not present

## 2022-12-24 DIAGNOSIS — R293 Abnormal posture: Secondary | ICD-10-CM | POA: Diagnosis not present

## 2022-12-24 DIAGNOSIS — M62830 Muscle spasm of back: Secondary | ICD-10-CM | POA: Diagnosis not present

## 2022-12-24 DIAGNOSIS — M9903 Segmental and somatic dysfunction of lumbar region: Secondary | ICD-10-CM | POA: Diagnosis not present

## 2022-12-27 DIAGNOSIS — M62462 Contracture of muscle, left lower leg: Secondary | ICD-10-CM | POA: Diagnosis not present

## 2022-12-27 DIAGNOSIS — M9901 Segmental and somatic dysfunction of cervical region: Secondary | ICD-10-CM | POA: Diagnosis not present

## 2022-12-27 DIAGNOSIS — M62830 Muscle spasm of back: Secondary | ICD-10-CM | POA: Diagnosis not present

## 2022-12-27 DIAGNOSIS — R293 Abnormal posture: Secondary | ICD-10-CM | POA: Diagnosis not present

## 2022-12-27 DIAGNOSIS — M9903 Segmental and somatic dysfunction of lumbar region: Secondary | ICD-10-CM | POA: Diagnosis not present

## 2022-12-27 DIAGNOSIS — M9906 Segmental and somatic dysfunction of lower extremity: Secondary | ICD-10-CM | POA: Diagnosis not present

## 2023-01-31 ENCOUNTER — Other Ambulatory Visit (HOSPITAL_COMMUNITY)
Admission: RE | Admit: 2023-01-31 | Discharge: 2023-01-31 | Disposition: A | Discharge status: Home / Self Care | Payer: BC Managed Care – PPO | Source: Ambulatory Visit | Attending: Obstetrics and Gynecology | Admitting: Obstetrics and Gynecology

## 2023-01-31 ENCOUNTER — Ambulatory Visit (INDEPENDENT_AMBULATORY_CARE_PROVIDER_SITE_OTHER): Payer: BC Managed Care – PPO | Admitting: Obstetrics and Gynecology

## 2023-01-31 ENCOUNTER — Encounter: Payer: Self-pay | Admitting: Obstetrics and Gynecology

## 2023-01-31 VITALS — BP 126/97 | HR 114 | Resp 16 | Ht 61.0 in | Wt 149.7 lb

## 2023-01-31 DIAGNOSIS — Z8742 Personal history of other diseases of the female genital tract: Secondary | ICD-10-CM | POA: Diagnosis not present

## 2023-01-31 DIAGNOSIS — N939 Abnormal uterine and vaginal bleeding, unspecified: Secondary | ICD-10-CM

## 2023-01-31 DIAGNOSIS — N941 Unspecified dyspareunia: Secondary | ICD-10-CM | POA: Diagnosis not present

## 2023-01-31 NOTE — Progress Notes (Signed)
    GYNECOLOGY PROGRESS NOTE  Subjective:    Patient ID: Sandra Marsh, female    DOB: [birth year], 27 y.o.   MRN: [id number]  HPI  Patient is a 27 y.o. G0P0000 female who presents for evaluation of vaginal bleeding. She has been having some irregular bleeding x 3 months. Bleeding is moderate and random at times, she also has bleeding after intercourse. Currently on Seasonale for birth control, has been on medication since last July.  Denies missing any pills.    The following portions of the patient's history were reviewed and updated as appropriate: allergies, current medications, past family history, past medical history, past social history, past surgical history, and problem list.  Review of Systems Pertinent items noted in HPI and remainder of comprehensive ROS otherwise negative.   Objective:   Blood pressure (!) 126/97, pulse (!) 114, resp. rate 16, height 5\' 1"  (1.549 m), weight 149 lb 11.2 oz (67.9 kg).  Body mass index is 28.29 kg/m. General appearance: alert, cooperative, and no distress Abdomen: soft, non-tender; bowel sounds normal; no masses,  no organomegaly Pelvic: external genitalia normal, rectovaginal septum normal.  Vagina with moderate amount of thin discharge.  Cervix normal appearing, no lesions and no motion tenderness.  Uterus mobile, nontender, normal shape and size.  Adnexae non-palpable, nontender bilaterally.  Extremities: extremities normal, atraumatic, no cyanosis or edema Neurologic: Grossly normal   Assessment:   1. Abnormal uterine bleeding   2. Dyspareunia, female   3. History of ovarian cyst      Plan:   - Pelvic ultrasound ordered to assess for cause of bleeding.  - Vaginal culture performed to r/o vaginitis/cervicitis - Patient also with h/o ovarian cysts in the past. Can assess via ultrasound due to patient's complaint of new onset dyspareunia.   Will notify patient of results through Mychart.    Hildred Laser, MD [facility]  [facility]

## 2023-02-04 LAB — CERVICOVAGINAL ANCILLARY ONLY
Bacterial Vaginitis (gardnerella): NEGATIVE
Candida Glabrata: NEGATIVE
Candida Vaginitis: NEGATIVE
Chlamydia: NEGATIVE
Comment: NEGATIVE
Comment: NEGATIVE
Comment: NEGATIVE
Comment: NEGATIVE
Comment: NEGATIVE
Comment: NORMAL
Neisseria Gonorrhea: NEGATIVE
Trichomonas: NEGATIVE

## 2023-02-13 DIAGNOSIS — F603 Borderline personality disorder: Secondary | ICD-10-CM | POA: Diagnosis not present

## 2023-02-15 ENCOUNTER — Ambulatory Visit (INDEPENDENT_AMBULATORY_CARE_PROVIDER_SITE_OTHER): Payer: BC Managed Care – PPO

## 2023-02-15 ENCOUNTER — Other Ambulatory Visit: Payer: Self-pay | Admitting: Obstetrics and Gynecology

## 2023-02-15 DIAGNOSIS — Z8742 Personal history of other diseases of the female genital tract: Secondary | ICD-10-CM | POA: Diagnosis not present

## 2023-02-15 DIAGNOSIS — N941 Unspecified dyspareunia: Secondary | ICD-10-CM

## 2023-02-15 DIAGNOSIS — N939 Abnormal uterine and vaginal bleeding, unspecified: Secondary | ICD-10-CM

## 2023-02-26 DIAGNOSIS — F1211 Cannabis abuse, in remission: Secondary | ICD-10-CM | POA: Diagnosis not present

## 2023-02-26 DIAGNOSIS — F411 Generalized anxiety disorder: Secondary | ICD-10-CM | POA: Diagnosis not present

## 2023-02-26 DIAGNOSIS — F902 Attention-deficit hyperactivity disorder, combined type: Secondary | ICD-10-CM | POA: Diagnosis not present

## 2023-02-26 DIAGNOSIS — F39 Unspecified mood [affective] disorder: Secondary | ICD-10-CM | POA: Diagnosis not present

## 2023-03-13 DIAGNOSIS — F603 Borderline personality disorder: Secondary | ICD-10-CM | POA: Diagnosis not present

## 2023-03-27 ENCOUNTER — Ambulatory Visit (INDEPENDENT_AMBULATORY_CARE_PROVIDER_SITE_OTHER): Payer: BC Managed Care – PPO

## 2023-03-27 VITALS — BP 118/82 | HR 125 | Ht 60.0 in | Wt 154.0 lb

## 2023-03-27 DIAGNOSIS — N912 Amenorrhea, unspecified: Secondary | ICD-10-CM

## 2023-03-27 DIAGNOSIS — N926 Irregular menstruation, unspecified: Secondary | ICD-10-CM

## 2023-03-27 DIAGNOSIS — Z3201 Encounter for pregnancy test, result positive: Secondary | ICD-10-CM

## 2023-03-27 DIAGNOSIS — R21 Rash and other nonspecific skin eruption: Secondary | ICD-10-CM | POA: Diagnosis not present

## 2023-03-27 LAB — POCT URINE PREGNANCY: Preg Test, Ur: POSITIVE — AB

## 2023-03-27 NOTE — Progress Notes (Signed)
    NURSE VISIT NOTE  Subjective:    Patient ID: Sandra Marsh, female    DOB: Oct 23, 1995, 27 y.o.   MRN: [id number]  HPI  Patient is a 27 y.o. G46P0000 female who presents for evaluation of amenorrhea. She believes she could be pregnant. Pregnancy is desired. Sexual Activity: has sex with males. Current symptoms also include: breast tenderness and frequent urination. Last period was normal.    Objective:    BP 118/82   Pulse (!) 125   Ht 5' (1.524 m)   Wt 154 lb (69.9 kg)   LMP 02/23/2023   BMI 30.08 kg/m   Lab Review  Results for orders placed or performed in visit on 03/27/23  POCT urine pregnancy  Result Value Ref Range   Preg Test, Ur Positive (A) Negative    Assessment:   1. Missed period     Plan:   Pregnancy Test: Positive  Estimated Date of Delivery: 11/30/23 Encouraged well-balanced diet, plenty of rest when needed, pre-natal vitamins daily and walking for exercise.  Discussed self-help for nausea, avoiding OTC medications until consulting provider or pharmacist, other than Tylenol as needed, minimal caffeine (1-2 cups daily) and avoiding alcohol.   She will schedule her nurse visit @ 7-[redacted] wks pregnant, u/s for dating @10  wk, and NOB visit at [redacted] wk pregnant.    Feel free to call with any questions.     Cornelius Moras, CMA

## 2023-04-04 ENCOUNTER — Other Ambulatory Visit: Payer: Self-pay | Admitting: Obstetrics and Gynecology

## 2023-04-22 ENCOUNTER — Ambulatory Visit (INDEPENDENT_AMBULATORY_CARE_PROVIDER_SITE_OTHER): Payer: BC Managed Care – PPO

## 2023-04-22 VITALS — Wt 154.0 lb

## 2023-04-22 DIAGNOSIS — F1211 Cannabis abuse, in remission: Secondary | ICD-10-CM | POA: Diagnosis not present

## 2023-04-22 DIAGNOSIS — Z3689 Encounter for other specified antenatal screening: Secondary | ICD-10-CM

## 2023-04-22 DIAGNOSIS — F411 Generalized anxiety disorder: Secondary | ICD-10-CM | POA: Diagnosis not present

## 2023-04-22 DIAGNOSIS — Z369 Encounter for antenatal screening, unspecified: Secondary | ICD-10-CM

## 2023-04-22 DIAGNOSIS — F902 Attention-deficit hyperactivity disorder, combined type: Secondary | ICD-10-CM | POA: Diagnosis not present

## 2023-04-22 DIAGNOSIS — F39 Unspecified mood [affective] disorder: Secondary | ICD-10-CM | POA: Diagnosis not present

## 2023-04-22 DIAGNOSIS — Z3490 Encounter for supervision of normal pregnancy, unspecified, unspecified trimester: Secondary | ICD-10-CM | POA: Insufficient documentation

## 2023-04-22 DIAGNOSIS — Z3480 Encounter for supervision of other normal pregnancy, unspecified trimester: Secondary | ICD-10-CM

## 2023-04-22 NOTE — Progress Notes (Signed)
Patient Scheduled.  Thanks

## 2023-04-22 NOTE — Patient Instructions (Signed)
First Trimester of Pregnancy  The first trimester of pregnancy starts on the first day of your last menstrual period until the end of week 12. This is also called months 1 through 3 of pregnancy. Body changes during your first trimester Your body goes through many changes during pregnancy. The changes usually return to normal after your baby is born. Physical changes You may gain or lose weight. Your breasts may grow larger and hurt. The area around your nipples may get darker. Dark spots or blotches may develop on your face. You may have changes in your hair. Health changes You may feel like you might vomit (nauseous), and you may vomit. You may have heartburn. You may have headaches. You may have trouble pooping (constipation). Your gums may bleed. Other changes You may get tired easily. You may pee (urinate) more often. Your menstrual periods will stop. You may not feel hungry. You may want to eat certain kinds of food. You may have changes in your emotions from day to day. You may have more dreams. Follow these instructions at home: Medicines Take over-the-counter and prescription medicines only as told by your doctor. Some medicines are not safe during pregnancy. Take a prenatal vitamin that contains at least 600 micrograms (mcg) of folic acid. Eating and drinking Eat healthy meals that include: Fresh fruits and vegetables. Whole grains. Good sources of protein, such as meat, eggs, or tofu. Low-fat dairy products. Avoid raw meat and unpasteurized juice, milk, and cheese. If you feel like you may vomit, or you vomit: Eat 4 or 5 small meals a day instead of 3 large meals. Try eating a few soda crackers. Drink liquids between meals instead of during meals. You may need to take these actions to prevent or treat trouble pooping: Drink enough fluids to keep your pee (urine) pale yellow. Eat foods that are high in fiber. These include beans, whole grains, and fresh fruits and  vegetables. Limit foods that are high in fat and sugar. These include fried or sweet foods. Activity Exercise only as told by your doctor. Most people can do their usual exercise routine during pregnancy. Stop exercising if you have cramps or pain in your lower belly (abdomen) or low back. Do not exercise if it is too hot or too humid, or if you are in a place of great height (high altitude). Avoid heavy lifting. If you choose to, you may have sex unless your doctor tells you not to. Relieving pain and discomfort Wear a good support bra if your breasts are sore. Rest with your legs raised (elevated) if you have leg cramps or low back pain. If you have bulging veins (varicose veins) in your legs: Wear support hose as told by your doctor. Raise your feet for 15 minutes, 3-4 times a day. Limit salt in your food. Safety Wear your seat belt at all times when you are in a car. Talk with your doctor if someone is hurting you or yelling at you. Talk with your doctor if you are feeling sad or have thoughts of hurting yourself. Lifestyle Do not use hot tubs, steam rooms, or saunas. Do not douche. Do not use tampons or scented sanitary pads. Do not use herbal medicines, illegal drugs, or medicines that are not approved by your doctor. Do not drink alcohol. Do not smoke or use any products that contain nicotine or tobacco. If you need help quitting, ask your doctor. Avoid cat litter boxes and soil that is used by cats. These carry   germs that can cause harm to the baby and can cause a loss of your baby by miscarriage or stillbirth. General instructions Keep all follow-up visits. This is important. Ask for help if you need counseling or if you need help with nutrition. Your doctor can give you advice or tell you where to go for help. Visit your dentist. At home, brush your teeth with a soft toothbrush. Floss gently. Write down your questions. Take them to your prenatal visits. Where to find more  information American Pregnancy Association: americanpregnancy.org American College of Obstetricians and Gynecologists: www.acog.org Office on Women's Health: womenshealth.gov/pregnancy Contact a doctor if: You are dizzy. You have a fever. You have mild cramps or pressure in your lower belly. You have a nagging pain in your belly area. You continue to feel like you may vomit, you vomit, or you have watery poop (diarrhea) for 24 hours or longer. You have a bad-smelling fluid coming from your vagina. You have pain when you pee. You are exposed to a disease that spreads from person to person, such as chickenpox, measles, Zika virus, HIV, or hepatitis. Get help right away if: You have spotting or bleeding from your vagina. You have very bad belly cramping or pain. You have shortness of breath or chest pain. You have any kind of injury, such as from a fall or a car crash. You have new or increased pain, swelling, or redness in an arm or leg. Summary The first trimester of pregnancy starts on the first day of your last menstrual period until the end of week 12 (months 1 through 3). Eat 4 or 5 small meals a day instead of 3 large meals. Do not smoke or use any products that contain nicotine or tobacco. If you need help quitting, ask your doctor. Keep all follow-up visits. This information is not intended to replace advice given to you by your health care provider. Make sure you discuss any questions you have with your health care provider. Document Revised: 01/27/2020 Document Reviewed: 12/03/2019 Elsevier Patient Education  2024 Elsevier Inc. Commonly Asked Questions During Pregnancy  Cats: A parasite can be excreted in cat feces.  To avoid exposure you need to have another person empty the little box.  If you must empty the litter box you will need to wear gloves.  Wash your hands after handling your cat.  This parasite can also be found in raw or undercooked meat so this should also be  avoided.  Colds, Sore Throats, Flu: Please check your medication sheet to see what you can take for symptoms.  If your symptoms are unrelieved by these medications please call the office.  Dental Work: Most any dental work your dentist recommends is permitted.  X-rays should only be taken during the first trimester if absolutely necessary.  Your abdomen should be shielded with a lead apron during all x-rays.  Please notify your provider prior to receiving any x-rays.  Novocaine is fine; gas is not recommended.  If your dentist requires a note from us prior to dental work please call the office and we will provide one for you.  Exercise: Exercise is an important part of staying healthy during your pregnancy.  You may continue most exercises you were accustomed to prior to pregnancy.  Later in your pregnancy you will most likely notice you have difficulty with activities requiring balance like riding a bicycle.  It is important that you listen to your body and avoid activities that put you at a higher   risk of falling.  Adequate rest and staying well hydrated are a must!  If you have questions about the safety of specific activities ask your provider.    Exposure to Children with illness: Try to avoid obvious exposure; report any symptoms to us when noted,  If you have chicken pos, red measles or mumps, you should be immune to these diseases.   Please do not take any vaccines while pregnant unless you have checked with your OB provider.  Fetal Movement: After 28 weeks we recommend you do "kick counts" twice daily.  Lie or sit down in a calm quiet environment and count your baby movements "kicks".  You should feel your baby at least 10 times per hour.  If you have not felt 10 kicks within the first hour get up, walk around and have something sweet to eat or drink then repeat for an additional hour.  If count remains less than 10 per hour notify your provider.  Fumigating: Follow your pest control agent's  advice as to how long to stay out of your home.  Ventilate the area well before re-entering.  Hemorrhoids:   Most over-the-counter preparations can be used during pregnancy.  Check your medication to see what is safe to use.  It is important to use a stool softener or fiber in your diet and to drink lots of liquids.  If hemorrhoids seem to be getting worse please call the office.   Hot Tubs:  Hot tubs Jacuzzis and saunas are not recommended while pregnant.  These increase your internal body temperature and should be avoided.  Intercourse:  Sexual intercourse is safe during pregnancy as long as you are comfortable, unless otherwise advised by your provider.  Spotting may occur after intercourse; report any bright red bleeding that is heavier than spotting.  Labor:  If you know that you are in labor, please go to the hospital.  If you are unsure, please call the office and let us help you decide what to do.  Lifting, straining, etc:  If your job requires heavy lifting or straining please check with your provider for any limitations.  Generally, you should not lift items heavier than that you can lift simply with your hands and arms (no back muscles)  Painting:  Paint fumes do not harm your pregnancy, but may make you ill and should be avoided if possible.  Latex or water based paints have less odor than oils.  Use adequate ventilation while painting.  Permanents & Hair Color:  Chemicals in hair dyes are not recommended as they cause increase hair dryness which can increase hair loss during pregnancy.  " Highlighting" and permanents are allowed.  Dye may be absorbed differently and permanents may not hold as well during pregnancy.  Sunbathing:  Use a sunscreen, as skin burns easily during pregnancy.  Drink plenty of fluids; avoid over heating.  Tanning Beds:  Because their possible side effects are still unknown, tanning beds are not recommended.  Ultrasound Scans:  Routine ultrasounds are performed  at approximately 20 weeks.  You will be able to see your baby's general anatomy an if you would like to know the gender this can usually be determined as well.  If it is questionable when you conceived you may also receive an ultrasound early in your pregnancy for dating purposes.  Otherwise ultrasound exams are not routinely performed unless there is a medical necessity.  Although you can request a scan we ask that you pay for it when   conducted because insurance does not cover " patient request" scans.  Work: If your pregnancy proceeds without complications you may work until your due date, unless your physician or employer advises otherwise.  Round Ligament Pain/Pelvic Discomfort:  Sharp, shooting pains not associated with bleeding are fairly common, usually occurring in the second trimester of pregnancy.  They tend to be worse when standing up or when you remain standing for long periods of time.  These are the result of pressure of certain pelvic ligaments called "round ligaments".  Rest, Tylenol and heat seem to be the most effective relief.  As the womb and fetus grow, they rise out of the pelvis and the discomfort improves.  Please notify the office if your pain seems different than that described.  It may represent a more serious condition.  Common Medications Safe in Pregnancy  Acne:      Constipation:  Benzoyl Peroxide     Colace  Clindamycin      Dulcolax Suppository  Topica Erythromycin     Fibercon  Salicylic Acid      Metamucil         Miralax AVOID:        Senakot   Accutane    Cough:  Retin-A       Cough Drops  Tetracycline      Phenergan w/ Codeine if Rx  Minocycline      Robitussin (Plain & DM)  Antibiotics:     Crabs/Lice:  Ceclor       RID  Cephalosporins    AVOID:  E-Mycins      Kwell  Keflex  Macrobid/Macrodantin   Diarrhea:  Penicillin      Kao-Pectate  Zithromax      Imodium AD         PUSH FLUIDS AVOID:       Cipro     Fever:  Tetracycline      Tylenol (Regular  or Extra  Minocycline       Strength)  Levaquin      Extra Strength-Do not          Exceed 8 tabs/24 hrs Caffeine:        <200mg/day (equiv. To 1 cup of coffee or  approx. 3 12 oz sodas)         Gas: Cold/Hayfever:       Gas-X  Benadryl      Mylicon  Claritin       Phazyme  **Claritin-D        Chlor-Trimeton    Headaches:  Dimetapp      ASA-Free Excedrin  Drixoral-Non-Drowsy     Cold Compress  Mucinex (Guaifenasin)     Tylenol (Regular or Extra  Sudafed/Sudafed-12 Hour     Strength)  **Sudafed PE Pseudoephedrine   Tylenol Cold & Sinus     Vicks Vapor Rub  Zyrtec  **AVOID if Problems With Blood Pressure         Heartburn: Avoid lying down for at least 1 hour after meals  Aciphex      Maalox     Rash:  Milk of Magnesia     Benadryl    Mylanta       1% Hydrocortisone Cream  Pepcid  Pepcid Complete   Sleep Aids:  Prevacid      Ambien   Prilosec       Benadryl  Rolaids       Chamomile Tea  Tums (Limit 4/day)     Unisom           Tylenol PM         Warm milk-add vanilla or  Hemorrhoids:       Sugar for taste  Anusol/Anusol H.C.  (RX: Analapram 2.5%)  Sugar Substitutes:  Hydrocortisone OTC     Ok in moderation  Preparation H      Tucks        Vaseline lotion applied to tissue with wiping    Herpes:     Throat:  Acyclovir      Oragel  Famvir  Valtrex     Vaccines:         Flu Shot Leg Cramps:       *Gardasil  Benadryl      Hepatitis A         Hepatitis B Nasal Spray:       Pneumovax  Saline Nasal Spray     Polio Booster         Tetanus Nausea:       Tuberculosis test or PPD  Vitamin B6 25 mg TID   AVOID:    Dramamine      *Gardasil  Emetrol       Live Poliovirus  Ginger Root 250 mg QID    MMR (measles, mumps &  High Complex Carbs @ Bedtime    rebella)  Sea Bands-Accupressure    Varicella (Chickenpox)  Unisom 1/2 tab TID     *No known complications           If received before Pain:         Known pregnancy;   Darvocet       Resume series  after  Lortab        Delivery  Percocet    Yeast:   Tramadol      Femstat  Tylenol 3      Gyne-lotrimin  Ultram       Monistat  Vicodin           MISC:         All Sunscreens           Hair Coloring/highlights          Insect Repellant's          (Including DEET)         Mystic Tans  

## 2023-04-22 NOTE — Progress Notes (Signed)
New OB Intake  I connected with  Henreitta A Noe on 04/22/23 at 10:15 AM EDT by telephone Video Visit and verified that I am speaking with the correct person using two identifiers. Nurse is located at [facility] and pt is located at home.  I explained I am completing New OB Intake today. We discussed her EDD of 11/30/2023 that is based on LMP of 02/23/2023. Pt is G1/P0. I reviewed her allergies, medications, Medical/Surgical/OB history, and appropriate screenings. There are cats in the home. FOB changes the litter box. Based on history, this is a/an pregnancy uncomplicated .   Patient Active Problem List   Diagnosis Date Noted   Supervision of other normal pregnancy, antepartum 04/22/2023   IUD (intrauterine device) in place 04/26/2021   Dysmenorrhea 04/26/2021   Right ovarian cyst 01/09/2021   MDD (major depressive disorder), recurrent severe, without psychosis (HCC) 09/08/2019   Suicidal ideations    chest pain/ pressure  10/07/2018   Shortness of breath at rest and with exertion  10/07/2018   Anxiety 07/09/2018    Concerns addressed today Constipation; adv per new protocol; cramping - when does it become a concern?  Adv when it stops her in her tracks or makes her double over.  Pt also aware if has spotting 24-48 hours after IC it's from IC.  Delivery Plans:  Plans to deliver at [facility].  Anatomy US Explained first scheduled [place] will be scheduled soon and an anatomy scan will be done at 20 weeks.  Labs Discussed genetic screening with patient. Patient desires genetic testing to be drawn at new OB visit. Discussed possible labs to be drawn at new OB appointment.  COVID Vaccine Patient has had COVID vaccine.   Social Determinants of Health Food Insecurity: expresses food insecurity. Information given on local food banks. Transportation: Patient denies transportation needs.  First visit review I reviewed new OB appt with pt. I explained she will have ob  bloodwork and pap smear/pelvic exam if indicated. Explained pt will be seen by an  AOB provider at first visit; encounter routed to appropriate provider.   Loran Senters, [facility] 04/22/2023  10:45 AM

## 2023-05-13 ENCOUNTER — Other Ambulatory Visit: Payer: Self-pay

## 2023-05-13 ENCOUNTER — Ambulatory Visit (INDEPENDENT_AMBULATORY_CARE_PROVIDER_SITE_OTHER): Payer: BC Managed Care – PPO

## 2023-05-13 DIAGNOSIS — Z369 Encounter for antenatal screening, unspecified: Secondary | ICD-10-CM

## 2023-05-13 DIAGNOSIS — Z3687 Encounter for antenatal screening for uncertain dates: Secondary | ICD-10-CM | POA: Diagnosis not present

## 2023-05-13 DIAGNOSIS — Z3A11 11 weeks gestation of pregnancy: Secondary | ICD-10-CM

## 2023-05-13 DIAGNOSIS — Z3480 Encounter for supervision of other normal pregnancy, unspecified trimester: Secondary | ICD-10-CM

## 2023-05-21 ENCOUNTER — Encounter: Payer: Self-pay | Admitting: Obstetrics and Gynecology

## 2023-05-21 ENCOUNTER — Ambulatory Visit (INDEPENDENT_AMBULATORY_CARE_PROVIDER_SITE_OTHER): Payer: BC Managed Care – PPO | Admitting: Obstetrics and Gynecology

## 2023-05-21 ENCOUNTER — Other Ambulatory Visit (HOSPITAL_COMMUNITY)
Admission: RE | Admit: 2023-05-21 | Discharge: 2023-05-21 | Disposition: A | Discharge status: Home / Self Care | Payer: BC Managed Care – PPO | Source: Ambulatory Visit | Attending: Obstetrics and Gynecology | Admitting: Obstetrics and Gynecology

## 2023-05-21 VITALS — BP 117/76 | HR 91 | Ht 60.0 in | Wt 182.8 lb

## 2023-05-21 DIAGNOSIS — Z3A12 12 weeks gestation of pregnancy: Secondary | ICD-10-CM | POA: Diagnosis not present

## 2023-05-21 DIAGNOSIS — Z124 Encounter for screening for malignant neoplasm of cervix: Secondary | ICD-10-CM | POA: Diagnosis not present

## 2023-05-21 DIAGNOSIS — Z1379 Encounter for other screening for genetic and chromosomal anomalies: Secondary | ICD-10-CM | POA: Diagnosis not present

## 2023-05-21 DIAGNOSIS — Z7722 Contact with and (suspected) exposure to environmental tobacco smoke (acute) (chronic): Secondary | ICD-10-CM | POA: Diagnosis not present

## 2023-05-21 DIAGNOSIS — Z3401 Encounter for supervision of normal first pregnancy, first trimester: Secondary | ICD-10-CM

## 2023-05-21 DIAGNOSIS — E669 Obesity, unspecified: Secondary | ICD-10-CM

## 2023-05-21 DIAGNOSIS — F332 Major depressive disorder, recurrent severe without psychotic features: Secondary | ICD-10-CM

## 2023-05-21 DIAGNOSIS — Z0283 Encounter for blood-alcohol and blood-drug test: Secondary | ICD-10-CM | POA: Diagnosis not present

## 2023-05-21 DIAGNOSIS — T7589XA Other specified effects of external causes, initial encounter: Secondary | ICD-10-CM

## 2023-05-21 DIAGNOSIS — E282 Polycystic ovarian syndrome: Secondary | ICD-10-CM

## 2023-05-21 DIAGNOSIS — Z113 Encounter for screening for infections with a predominantly sexual mode of transmission: Secondary | ICD-10-CM

## 2023-05-21 DIAGNOSIS — F419 Anxiety disorder, unspecified: Secondary | ICD-10-CM

## 2023-05-21 LAB — OB RESULTS CONSOLE VARICELLA ZOSTER ANTIBODY, IGG: Varicella: IMMUNE

## 2023-05-21 MED ORDER — ASPIRIN 81 MG PO TBEC: 81.0000 mg | DELAYED_RELEASE_TABLET | Freq: Every day | ORAL | 2 refills | Status: DC

## 2023-05-21 NOTE — Progress Notes (Signed)
OBSTETRIC INITIAL PRENATAL VISIT  Subjective:    Sandra Marsh is being seen today for her first obstetrical visit.  This is a planned pregnancy. She is a 27 y.o. G1P0000 female at [redacted]w[redacted]d gestation, Estimated Date of Delivery: 11/30/23 with Patient's last menstrual period was 02/23/2023 (approximate).,  consistent with 11 week sono. Her obstetrical history is significant for obesity, PCOS, anxiety and depression. Relationship with FOB: significant other, not living together. Patient does intend to breast feed. Pregnancy history fully reviewed.  Sandra Marsh has several complaints today:  Notes that she has been having issues with sleeping during pregnancy.  Usually can fall asleep however at some point during the night she tends to wake up and has difficulty staying asleep.  On average she gets about 5 to 6 hours of sleep per day however is used to getting closer to 7-8 and so has not been feeling as energized or refreshed with the sleep she is getting. Also has some concerns about her job duties.  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  Also does a lot of standing and walking which has started to create some back pain for her.  Wonders how she may go about getting work accommodations for her job.   OB History  Gravida Para Term Preterm AB Living  1 0 0 0 0 0  SAB IAB Ectopic Multiple Live Births  0 0 0 0 0    # Outcome Date GA Lbr Len/2nd Weight Sex Type Anes PTL Lv  1 Current             Gynecologic History:  Last pap smear was 04/01/2020.  Results were Normal.  reports h/o abnormal pap smears in the past.  reports history of STIs.  Contraception prior to conception: combined OCPs, stopped in  May 2024.    Past Medical History:  Diagnosis Date   Anxiety 07/09/2018   MDD (major depressive disorder) 09/07/2019   Ovarian cyst    PCOS (polycystic ovarian syndrome)     Family History  Problem Relation  Age of Onset   Ovarian cancer Mother 52   Healthy Father    Healthy Sister    Healthy Sister    Heart Problems Maternal Grandmother    COPD Maternal Grandmother    Healthy Maternal Grandfather    Hypertension Paternal Grandmother    Healthy Paternal Grandfather     Past Surgical History:  Procedure Laterality Date   INTRAUTERINE DEVICE (IUD) INSERTION N/A 02/06/2021   Procedure: INTRAUTERINE DEVICE (IUD) INSERTION;  Surgeon: Hildred Laser, MD;  Location: [facility] ORS;  Service: Gynecology;  Laterality: N/A;   IUD REMOVAL N/A 03/12/2022   Procedure: INTRAUTERINE DEVICE (IUD) REMOVAL;  Surgeon: Hildred Laser, MD;  Location: [facility] ORS;  Service: Gynecology;  Laterality: N/A;   LAPAROSCOPIC OVARIAN CYSTECTOMY Right 02/06/2021   Procedure: LAPAROSCOPIC OVARIAN CYSTECTOMY;  Surgeon: Hildred Laser, MD;  Location: [facility] ORS;  Service: Gynecology;  Laterality: Right;   WISDOM TOOTH EXTRACTION     four; age 71    Social History   Socioeconomic History   Marital status: Single    Spouse name: Not on file   Number of children: 0   Years of education: 16   Highest education level: Not on file  Occupational History   Occupation: mental health counselor  Tobacco Use   Smoking status: Former    Types: Cigarettes   Smokeless tobacco:  Never  Vaping Use   Vaping status: Former   Quit date: 08/14/2022   Substances: CBD, Flavoring  Substance and Sexual Activity   Alcohol use: Never   Drug use: Not Currently    Types: Marijuana   Sexual activity: Yes    Birth control/protection: None  Other Topics Concern   Not on file  Social History Narrative   Not on file   Social Determinants of Health   Financial Resource Strain: Low Risk  (04/22/2023)   Overall Financial Resource Strain (CARDIA)    Difficulty of Paying Living Expenses: Not hard at all  Food Insecurity: Food Insecurity Present (04/22/2023)   Hunger Vital Sign    Worried About Running Out of Food in the Last Year: Sometimes true     Ran Out of Food in the Last Year: Never true  Transportation Needs: No Transportation Needs (04/22/2023)   PRAPARE - Administrator, Civil Service (Medical): No    Lack of Transportation (Non-Medical): No  Physical Activity: Inactive (04/22/2023)   Exercise Vital Sign    Days of Exercise per Week: 0 days    Minutes of Exercise per Session: 0 min  Stress: No Stress Concern Present (04/22/2023)   [employer] of Occupational Health - Occupational Stress Questionnaire    Feeling of Stress : Not at all  Social Connections: Unknown (04/22/2023)   Social Connection and Isolation Panel [NHANES]    Frequency of Communication with Friends and Family: Three times a week    Frequency of Social Gatherings with Friends and Family: Once a week    Attends Religious Services: Never    Database administrator or Organizations: No    Attends Banker Meetings: Never    Marital Status: Not on file  Intimate Partner Violence: Not At Risk (04/22/2023)   Humiliation, Afraid, Rape, and Kick questionnaire    Fear of Current or Ex-Partner: No    Emotionally Abused: No    Physically Abused: No    Sexually Abused: No    Current Outpatient Medications on File Prior to Visit  Medication Sig Dispense Refill   lamoTRIgine (LAMICTAL) 200 MG tablet Take 200 mg by mouth in the morning.     lamoTRIgine (LAMICTAL) 25 MG tablet Take 50 mg by mouth at bedtime.     Prenatal Vit-Fe Fumarate-FA (MULTIVITAMIN-PRENATAL) 27-0.8 MG TABS tablet Take 1 tablet by mouth daily at 12 noon.     No current facility-administered medications on file prior to visit.    Allergies  Allergen Reactions   Nickel Rash     Review of Systems General: Not Present- Fever, Weight Loss and Weight Gain. Skin: Not Present- Rash. HEENT: Not Present- Blurred Vision, Headache and Bleeding Gums. Respiratory: Not Present- Difficulty Breathing. Breast: Not Present- Breast Mass. Cardiovascular: Not Present- Chest Pain,  Elevated Blood Pressure, Fainting / Blacking Out and Shortness of Breath. Gastrointestinal: Not Present- Abdominal Pain, Constipation, Nausea and Vomiting. Female Genitourinary: Not Present- Frequency, Painful Urination, Pelvic Pain, Vaginal Bleeding, Vaginal Discharge, Contractions, regular, Fetal Movements Decreased, Urinary Complaints and Vaginal Fluid. Musculoskeletal: Not Present- Back Pain and Leg Cramps. Neurological: Not Present- Dizziness. Psychiatric: Not Present- Depression.     Objective:   Blood pressure 117/76, pulse 91, weight 182 lb 12.8 oz (82.9 kg), last menstrual period 02/23/2023.  Body mass index is 35.7 kg/m.  General Appearance:    Alert, cooperative, no distress, appears stated age, obesity  Head:    Normocephalic, without obvious abnormality, atraumatic  Eyes:  PERRL, conjunctiva/corneas clear, EOM's intact, both eyes  Ears:    Normal external ear canals, both ears  Nose:   Nares normal, septum midline, mucosa normal, no drainage or sinus tenderness  Throat:   Lips, mucosa, and tongue normal; teeth and gums normal  Neck:   Supple, symmetrical, trachea midline, no adenopathy; thyroid: no enlargement/tenderness/nodules; no carotid bruit or JVD  Back:     Symmetric, no curvature, ROM normal, no CVA tenderness  Lungs:     Clear to auscultation bilaterally, respirations unlabored  Chest Wall:    No tenderness or deformity   Heart:    Regular rate and rhythm, S1 and S2 normal, no murmur, rub or gallop  Breast Exam:    No tenderness, masses, or nipple abnormality  Abdomen:     Soft, non-tender, bowel sounds active all four quadrants, no masses, no organomegaly.  FHT 156 bpm.  Genitalia:    Pelvic:external genitalia normal, vagina without lesions, discharge, or tenderness, rectovaginal septum  normal. Cervix normal in appearance, no cervical motion tenderness, no adnexal masses or tenderness.  Pregnancy positive findings: uterine enlargement: 12 wk size, nontender.    Rectal:    Normal external sphincter.  No hemorrhoids appreciated. Internal exam not done.   Extremities:   Extremities normal, atraumatic, no cyanosis or edema  Pulses:   2+ and symmetric all extremities  Skin:   Skin color, texture, turgor normal, no rashes or lesions  Lymph nodes:   Cervical, supraclavicular, and axillary nodes normal  Neurologic:   CNII-XII intact, normal strength, sensation and reflexes throughout     Assessment:   1. Encounter for supervision of normal first pregnancy in first trimester   2. Screen for STD (sexually transmitted disease)   3. [redacted] weeks gestation of pregnancy   4. Exposure to cat feces, initial encounter   5. Genetic screening   6. Encounter for drug screening   7. Tobacco smoke exposure   8. Cervical cancer screening   9. PCOS (polycystic ovarian syndrome)   10. MDD (major depressive disorder), recurrent severe, without psychosis (HCC)   11. Anxiety   12. Obesity, Class II, BMI 35-39.9     Plan:   Supervision of normal first pregnancy  - Initial labs ordered. - Prenatal vitamins encouraged. - Problem list reviewed and updated. - New OB counseling:  The patient has been given an overview regarding routine prenatal care.   - Prenatal testing, optional genetic testing, and ultrasound use in pregnancy were reviewed.  Traditional genetic screening vs cell-fee DNA genetic screening discussed, including risks and benefits. Testing ordered. - Benefits of Breast Feeding were discussed. The patient is encouraged to consider nursing her baby post partum. -Advised on flu vaccine next visit.   2. Cervical cancer screening - Pap smear performed today.  - Cytology - PAP  3. PCOS (polycystic ovarian syndrome) - History of PCOS, recurrent ovarian cysts, was managed on OCPs prior to pregnancy.  -Discussed that history of PCOS places patient at increased risk of certain conditions in pregnancy such as gestational diabetes and hypertension.  4. MDD  (major depressive disorder), recurrent severe, without psychosis (HCC) -That she has not had an episode in some time as it has been controlled on her medication (Lamictal).  Has recently stopped her medications.  I discussed that if symptoms arise during the pregnancy can be treated with an alternative option, or referral to therapy. -Baseline screening performed.  5. Anxiety -See above #4.  If symptoms arise can discuss medication treatments, or  referral to therapy. -Baseline screening performed today.  6.  Obesity -Advised on beginning daily baby aspirin for prevention of preeclampsia and diabetes. - Recommendations regarding diet, weight gain, and exercise in pregnancy were given.  Follow up in 4 weeks.    Hildred Laser, MD [facility]

## 2023-05-23 LAB — MONITOR DRUG PROFILE 14(MW)
Amphetamine Scrn, Ur: NEGATIVE ng/mL
BARBITURATE SCREEN URINE: NEGATIVE ng/mL
BENZODIAZEPINE SCREEN, URINE: NEGATIVE ng/mL
Buprenorphine, Urine: NEGATIVE ng/mL
CANNABINOIDS UR QL SCN: NEGATIVE ng/mL
Cocaine (Metab) Scrn, Ur: NEGATIVE ng/mL
Creatinine(Crt), U: 30.8 mg/dL (ref 20.0–300.0)
Fentanyl, Urine: NEGATIVE pg/mL
Meperidine Screen, Urine: NEGATIVE ng/mL
Methadone Screen, Urine: NEGATIVE ng/mL
OXYCODONE+OXYMORPHONE UR QL SCN: NEGATIVE ng/mL
Opiate Scrn, Ur: NEGATIVE ng/mL
Ph of Urine: 5.5 (ref 4.5–8.9)
Phencyclidine Qn, Ur: NEGATIVE ng/mL
Propoxyphene Scrn, Ur: NEGATIVE ng/mL
SPECIFIC GRAVITY: 1.009
Tramadol Screen, Urine: NEGATIVE ng/mL

## 2023-05-23 LAB — NICOTINE SCREEN, URINE: Cotinine Ql Scrn, Ur: NEGATIVE ng/mL

## 2023-05-23 LAB — CERVICOVAGINAL ANCILLARY ONLY
Chlamydia: NEGATIVE
Comment: NEGATIVE
Comment: NORMAL
Neisseria Gonorrhea: NEGATIVE

## 2023-05-23 LAB — URINE CULTURE, OB REFLEX: Organism ID, Bacteria: NO GROWTH

## 2023-05-23 LAB — CULTURE, OB URINE

## 2023-05-24 LAB — CBC/D/PLT+RPR+RH+ABO+RUBIGG...
Antibody Screen: NEGATIVE
Basophils Absolute: 0.1 10*3/uL (ref 0.0–0.2)
Basos: 1 %
EOS (ABSOLUTE): 0.2 10*3/uL (ref 0.0–0.4)
Eos: 2 %
HCV Ab: NONREACTIVE
HIV Screen 4th Generation wRfx: NONREACTIVE
Hematocrit: 36.4 % (ref 34.0–46.6)
Hemoglobin: 12 g/dL (ref 11.1–15.9)
Hepatitis B Surface Ag: NEGATIVE
Immature Grans (Abs): 0.2 10*3/uL — ABNORMAL HIGH (ref 0.0–0.1)
Immature Granulocytes: 2 %
Lymphocytes Absolute: 3.2 10*3/uL — ABNORMAL HIGH (ref 0.7–3.1)
Lymphs: 29 %
MCH: 29.7 pg (ref 26.6–33.0)
MCHC: 33 g/dL (ref 31.5–35.7)
MCV: 90 fL (ref 79–97)
Monocytes Absolute: 0.7 10*3/uL (ref 0.1–0.9)
Monocytes: 7 %
Neutrophils Absolute: 6.5 10*3/uL (ref 1.4–7.0)
Neutrophils: 59 %
Platelets: 303 10*3/uL (ref 150–450)
RBC: 4.04 x10E6/uL (ref 3.77–5.28)
RDW: 13 % (ref 11.7–15.4)
RPR Ser Ql: NONREACTIVE
Rh Factor: POSITIVE
Rubella Antibodies, IGG: 1.33 index (ref >0.99)
Varicella zoster IgG: 1092 index (ref >165)
WBC: 10.9 10*3/uL — ABNORMAL HIGH (ref 3.4–10.8)

## 2023-05-24 LAB — CYTOLOGY - PAP: Diagnosis: NEGATIVE

## 2023-05-24 LAB — TOXOPLASMA ANTIBODIES- IGG AND  IGM
Toxoplasma Antibody- IgM: <3 AU/mL (ref 0.0–7.9)
Toxoplasma IgG Ratio: <3 IU/mL (ref 0.0–7.1)

## 2023-05-24 LAB — HGB SOLU + RFLX FRAC: Sickle Solubility Test - HGBRFX: NEGATIVE

## 2023-05-24 LAB — HCV INTERPRETATION

## 2023-05-30 LAB — PANORAMA PRENATAL TEST FULL PANEL:PANORAMA TEST PLUS 5 ADDITIONAL MICRODELETIONS: FETAL FRACTION: 4.9

## 2023-06-18 ENCOUNTER — Ambulatory Visit: Payer: BC Managed Care – PPO | Admitting: Advanced Practice Midwife

## 2023-06-18 ENCOUNTER — Encounter: Payer: Self-pay | Admitting: Obstetrics and Gynecology

## 2023-06-18 VITALS — BP 111/67 | HR 98 | Wt 191.1 lb

## 2023-06-18 DIAGNOSIS — Z369 Encounter for antenatal screening, unspecified: Secondary | ICD-10-CM

## 2023-06-18 DIAGNOSIS — O2602 Excessive weight gain in pregnancy, second trimester: Secondary | ICD-10-CM

## 2023-06-18 DIAGNOSIS — Z23 Encounter for immunization: Secondary | ICD-10-CM | POA: Diagnosis not present

## 2023-06-18 DIAGNOSIS — Z0289 Encounter for other administrative examinations: Secondary | ICD-10-CM

## 2023-06-18 DIAGNOSIS — Z3402 Encounter for supervision of normal first pregnancy, second trimester: Secondary | ICD-10-CM

## 2023-06-18 DIAGNOSIS — Z3A16 16 weeks gestation of pregnancy: Secondary | ICD-10-CM

## 2023-06-18 LAB — POCT URINALYSIS DIPSTICK
Bilirubin, UA: NEGATIVE
Blood, UA: NEGATIVE
Glucose, UA: NEGATIVE
Ketones, UA: NEGATIVE
Leukocytes, UA: NEGATIVE
Nitrite, UA: NEGATIVE
Protein, UA: NEGATIVE
Spec Grav, UA: 1.025 (ref 1.010–1.025)
Urobilinogen, UA: 0.2 U/dL
pH, UA: 7.5 (ref 5.0–8.0)

## 2023-06-18 NOTE — Telephone Encounter (Signed)
Please see patient message regarding work restrictions.

## 2023-06-18 NOTE — Progress Notes (Signed)
Routine Prenatal Care Visit  Subjective  Sandra Marsh is a 27 y.o. G1P0000 at [redacted]w[redacted]d being seen today for ongoing prenatal care.  She is currently monitored for the following issues for this low-risk pregnancy and has Anxiety; MDD (major depressive disorder), recurrent severe, without psychosis (HCC); and Supervision of normal pregnancy on their problem list.  ----------------------------------------------------------------------------------- Patient reports she is doing well. We discussed her 41 pound weight gain to this point. Her appetite had greatly increased early in the pregnancy before she was aware. She accepts referral to [facility].   Contractions: Not present. Vag. Bleeding: None.  Movement: Present. Leaking Fluid denies.  ----------------------------------------------------------------------------------- The following portions of the patient's history were reviewed and updated as appropriate: allergies, current medications, past family history, past medical history, past social history, past surgical history and problem list. Problem list updated.  Objective  Blood pressure 111/67, pulse 98, weight 191 lb 1.6 oz (86.7 kg), last menstrual period 02/23/2023. Pregravid weight 150 lb (68 kg) Total Weight Gain 41 lb 1.6 oz (18.6 kg) Urinalysis: Urine Protein    Urine Glucose    Fetal Status: Fetal Heart Rate (bpm): 148   Movement: Present     General:  Alert, oriented and cooperative. Patient is in no acute distress.  Skin: Skin is warm and dry. No rash noted.   Cardiovascular: Normal heart rate noted  Respiratory: Normal respiratory effort, no problems with respiration noted  Abdomen: Soft, gravid, appropriate for gestational age. Pain/Pressure: Absent     Pelvic:  Cervical exam deferred        Extremities: Normal range of motion.  Edema: None  Mental Status: Normal mood and affect. Normal behavior. Normal judgment and thought content.   Assessment   27 y.o. G1P0000 at [redacted]w[redacted]d by   11/30/2023, Date entered prior to episode creation presenting for routine prenatal visit  Plan   first Problems (from 04/22/23 to present)     Problem Noted Resolved   Supervision of normal pregnancy 04/22/2023 by Loran Senters, CMA No   Clinical Staff Provider  Office Location  [facility] Dating  By LMP  Language  English Anatomy US    Flu Vaccine  06/18/2023 Genetic Screen  NIPS: Panorama low risk, female   TDaP vaccine   offer Hgb A1C or  GTT Early : Third trimester :   Covid UTD   LAB RESULTS   Rhogam  O/Positive/-- (09/17 1600)  Blood Type O/Positive/-- (09/17 1600)   Feeding Plan breast Antibody Negative (09/17 1600)  Contraception pill Rubella 1.33 (09/17 1600)  Circumcision yes RPR Non Reactive (09/17 1600)   Pediatrician  undecided HBsAg Negative (09/17 1600)   Support Person Fayrene Fearing HIV Non Reactive (09/17 1600)  Prenatal Classes yes Varicella 1,092 (09/17 1600)    GBS  (For PCN allergy, check sensitivities)   BTL Consent  Hep C Non Reactive (09/17 1600)   VBAC Consent  Pap Diagnosis  Date Value Ref Range Status  05/21/2023   Final   - Negative for intraepithelial lesion or malignancy (NILM)      Hgb Electro      CF      SMA          Preterm labor symptoms and general obstetric precautions including but not limited to vaginal bleeding, contractions, leaking of fluid and fetal movement were reviewed in detail with the patient. Please refer to After Visit Summary for other counseling recommendations.   Return in about 4 weeks (around 07/16/2023) for anatomy scan and rob after.  Tresea Mall, CNM 06/18/2023 3:44 PM

## 2023-06-21 DIAGNOSIS — F5105 Insomnia due to other mental disorder: Secondary | ICD-10-CM | POA: Diagnosis not present

## 2023-06-21 DIAGNOSIS — F411 Generalized anxiety disorder: Secondary | ICD-10-CM | POA: Diagnosis not present

## 2023-06-21 DIAGNOSIS — F39 Unspecified mood [affective] disorder: Secondary | ICD-10-CM | POA: Diagnosis not present

## 2023-06-21 DIAGNOSIS — F902 Attention-deficit hyperactivity disorder, combined type: Secondary | ICD-10-CM | POA: Diagnosis not present

## 2023-06-24 ENCOUNTER — Encounter: Payer: Self-pay | Admitting: Obstetrics and Gynecology

## 2023-07-12 DIAGNOSIS — S80869A Insect bite (nonvenomous), unspecified lower leg, initial encounter: Secondary | ICD-10-CM | POA: Diagnosis not present

## 2023-07-12 DIAGNOSIS — W57XXXA Bitten or stung by nonvenomous insect and other nonvenomous arthropods, initial encounter: Secondary | ICD-10-CM | POA: Diagnosis not present

## 2023-07-16 ENCOUNTER — Ambulatory Visit: Payer: BC Managed Care – PPO

## 2023-07-16 ENCOUNTER — Ambulatory Visit (INDEPENDENT_AMBULATORY_CARE_PROVIDER_SITE_OTHER): Payer: BC Managed Care – PPO | Admitting: Certified Nurse Midwife

## 2023-07-16 ENCOUNTER — Ambulatory Visit
Admission: RE | Admit: 2023-07-16 | Discharge: 2023-07-16 | Disposition: A | Discharge status: Home / Self Care | Payer: BC Managed Care – PPO | Source: Ambulatory Visit | Attending: Advanced Practice Midwife | Admitting: Advanced Practice Midwife

## 2023-07-16 VITALS — BP 107/75 | HR 83 | Wt 200.4 lb

## 2023-07-16 DIAGNOSIS — O321XX0 Maternal care for breech presentation, not applicable or unspecified: Secondary | ICD-10-CM | POA: Diagnosis not present

## 2023-07-16 DIAGNOSIS — Z369 Encounter for antenatal screening, unspecified: Secondary | ICD-10-CM | POA: Diagnosis not present

## 2023-07-16 DIAGNOSIS — Z3A20 20 weeks gestation of pregnancy: Secondary | ICD-10-CM

## 2023-07-16 DIAGNOSIS — O4592 Premature separation of placenta, unspecified, second trimester: Secondary | ICD-10-CM | POA: Diagnosis not present

## 2023-07-16 DIAGNOSIS — O321XX1 Maternal care for breech presentation, fetus 1: Secondary | ICD-10-CM | POA: Diagnosis not present

## 2023-07-16 DIAGNOSIS — Z3689 Encounter for other specified antenatal screening: Secondary | ICD-10-CM | POA: Insufficient documentation

## 2023-07-16 DIAGNOSIS — Z3402 Encounter for supervision of normal first pregnancy, second trimester: Secondary | ICD-10-CM | POA: Insufficient documentation

## 2023-07-16 NOTE — Progress Notes (Signed)
ROB doing well, feeling movement. Pt had anatomy u/s today . Results not available at this time. Discussed round ligament pain and self help measures. PT notes she went to urgent care recently to a few bites that became very swollen and bruised. She was given steroid cream that is helping. Discussed benadryl as needed for itching, encouraged ice to area if needed. Follow up 4 wks for ROB.   Doreene Burke, CNM

## 2023-07-16 NOTE — Patient Instructions (Signed)
Round Ligament Pain  The round ligaments are a pair of cord-like tissues that help support the uterus. They can become a source of pain during pregnancy as the ligaments soften and stretch as the baby grows. The pain usually begins in the second trimester (13-28 weeks) of pregnancy, and should only last for a few seconds when it occurs. However, the pain can come and go until the baby is delivered. The pain does not cause harm to the baby. Round ligament pain is usually a short, sharp, and pinching pain, but it can also be a dull, lingering, and aching pain. The pain is felt in the lower side of the abdomen or in the groin. It usually starts deep in the groin and moves up to the outside of the hip area. The pain may happen when you: Suddenly change position, such as quickly going from a sitting to standing position. Do physical activity. Cough or sneeze. Follow these instructions at home: Managing pain  When the pain starts, relax. Then, try any of these methods to help with the pain: Sit down. Flex your knees up to your abdomen. Lie on your side with one pillow under your abdomen and another pillow between your legs. Sit in a warm bath for 15-20 minutes or until the pain goes away. General instructions Watch your condition for any changes. Move slowly when you sit down or stand up. Stop or reduce your physical activities if they cause pain. Avoid long walks if they cause pain. Take over-the-counter and prescription medicines only as told by your health care provider. Keep all follow-up visits. This is important. Contact a health care provider if: Your pain does not go away with treatment. You feel pain in your back that you did not have before. Your medicine is not helping. You have a fever or chills. You have nausea or vomiting. You have diarrhea. You have pain when you urinate. Get help right away if: You have pain that is a rhythmic, cramping pain similar to labor pains. Labor  pains are usually 2 minutes apart, last for about 1 minute, and involve a bearing down feeling or pressure in your pelvis. You have vaginal bleeding. These symptoms may represent a serious problem that is an emergency. Do not wait to see if the symptoms will go away. Get medical help right away. Call your local emergency services (911 in the [place] Do not drive yourself to the hospital. Summary Round ligament pain is felt in the lower abdomen or groin. This pain usually begins in the second trimester (13-28 weeks) and should only last for a few seconds when it occurs. You may notice the pain when you suddenly change position, when you cough or sneeze, or during physical activity. Relaxing, flexing your knees to your abdomen, lying on one side, or taking a warm bath may help to get rid of the pain. Contact your health care provider if the pain does not go away. This information is not intended to replace advice given to you by your health care provider. Make sure you discuss any questions you have with your health care provider. Document Revised: 11/02/2020 Document Reviewed: 11/02/2020 Elsevier Patient Education  2024 Elsevier Inc.  

## 2023-07-17 ENCOUNTER — Telehealth: Payer: Self-pay

## 2023-07-17 DIAGNOSIS — O283 Abnormal ultrasonic finding on antenatal screening of mother: Secondary | ICD-10-CM

## 2023-07-17 NOTE — Telephone Encounter (Signed)
Patient returned phone call. Reviewed results of ultrasound including  2-vessel umbilical cord and marginal abruption of anterior placenta.  Discussed implications on growing pregnancy.  Given bleeding precautions, advised on light duty and pelvic rest for next several weeks until abruption can be followed up. Reviewed chart, had normal genetic testing. Repeat sono in 3 weeks as recommended. For growth scans and antenatal testing in 3rd trimester. Patient notes understanding of all recommendations.    Hildred Laser, MD [facility]

## 2023-07-17 NOTE — Telephone Encounter (Signed)
U/S tech called to give following result:  IMPRESSION: Assigned GA currently 20 weeks 3 days.  Appropriate fetal growth.   Two-vessel umbilical cord noted. No other fetal anomaly seen, although anatomic survey is limited by fetal position. Recommend follow-up ultrasound in 3 weeks.   Anterior placenta, with marginal abruption noted. No evidence of previa.   Normal amniotic fluid volume.  Normal cervical length.  Erskine Squibb is ordering provider and she returns next week.

## 2023-07-17 NOTE — Telephone Encounter (Signed)
Attempted to contact patient regarding ultrasound results. Left message for patient to return call to the office.    Dr. Valentino Saxon

## 2023-07-23 ENCOUNTER — Other Ambulatory Visit: Payer: Self-pay | Admitting: Advanced Practice Midwife

## 2023-08-07 ENCOUNTER — Other Ambulatory Visit: Payer: Self-pay

## 2023-08-07 ENCOUNTER — Ambulatory Visit: Payer: BC Managed Care – PPO

## 2023-08-07 DIAGNOSIS — Z3A22 22 weeks gestation of pregnancy: Secondary | ICD-10-CM

## 2023-08-07 DIAGNOSIS — O43192 Other malformation of placenta, second trimester: Secondary | ICD-10-CM

## 2023-08-07 DIAGNOSIS — O283 Abnormal ultrasonic finding on antenatal screening of mother: Secondary | ICD-10-CM

## 2023-08-07 DIAGNOSIS — O468X2 Other antepartum hemorrhage, second trimester: Secondary | ICD-10-CM

## 2023-08-07 DIAGNOSIS — O09899 Supervision of other high risk pregnancies, unspecified trimester: Secondary | ICD-10-CM

## 2023-08-07 NOTE — Progress Notes (Signed)
anatomy scan done in the hospital and they called a 2vc and mild placental abruption. I did her follow up today and the 2vc is there with a marginal placental cord insert. She appears to have a large subchorionic hemorrhage. I am recommending a referral to MFM so they can investigate further and to see how we should monitor her  Hildred Laser, MD Yes, that's fine.

## 2023-08-11 DIAGNOSIS — O09899 Supervision of other high risk pregnancies, unspecified trimester: Secondary | ICD-10-CM | POA: Insufficient documentation

## 2023-08-11 HISTORY — DX: Supervision of other high risk pregnancies, unspecified trimester: O09.899

## 2023-08-13 ENCOUNTER — Other Ambulatory Visit (HOSPITAL_COMMUNITY)
Admission: RE | Admit: 2023-08-13 | Discharge: 2023-08-13 | Disposition: A | Discharge status: Home / Self Care | Payer: BC Managed Care – PPO | Source: Ambulatory Visit | Attending: Certified Nurse Midwife | Admitting: Certified Nurse Midwife

## 2023-08-13 ENCOUNTER — Ambulatory Visit (INDEPENDENT_AMBULATORY_CARE_PROVIDER_SITE_OTHER): Payer: BC Managed Care – PPO | Admitting: Certified Nurse Midwife

## 2023-08-13 VITALS — BP 119/71 | HR 115 | Wt 212.1 lb

## 2023-08-13 DIAGNOSIS — N898 Other specified noninflammatory disorders of vagina: Secondary | ICD-10-CM | POA: Insufficient documentation

## 2023-08-13 DIAGNOSIS — Z114 Encounter for screening for human immunodeficiency virus [HIV]: Secondary | ICD-10-CM

## 2023-08-13 DIAGNOSIS — O43192 Other malformation of placenta, second trimester: Secondary | ICD-10-CM | POA: Insufficient documentation

## 2023-08-13 DIAGNOSIS — Z3402 Encounter for supervision of normal first pregnancy, second trimester: Secondary | ICD-10-CM

## 2023-08-13 DIAGNOSIS — O26893 Other specified pregnancy related conditions, third trimester: Secondary | ICD-10-CM | POA: Insufficient documentation

## 2023-08-13 DIAGNOSIS — Z113 Encounter for screening for infections with a predominantly sexual mode of transmission: Secondary | ICD-10-CM

## 2023-08-13 DIAGNOSIS — O09899 Supervision of other high risk pregnancies, unspecified trimester: Secondary | ICD-10-CM

## 2023-08-13 DIAGNOSIS — Z131 Encounter for screening for diabetes mellitus: Secondary | ICD-10-CM

## 2023-08-13 DIAGNOSIS — O26892 Other specified pregnancy related conditions, second trimester: Secondary | ICD-10-CM

## 2023-08-13 DIAGNOSIS — Z3A24 24 weeks gestation of pregnancy: Secondary | ICD-10-CM

## 2023-08-13 NOTE — Assessment & Plan Note (Addendum)
Red flag symptoms reviewed. Anticipatory guidance for 28w labs, TDAP discussed. Aptima swab sent for dark brown discharge, reassured of no active bleeding or evidence of preterm labor on speculum exam

## 2023-08-13 NOTE — Assessment & Plan Note (Signed)
MFM consult ordered, pending scheduling. Reviewed serial growth scans in 3rd trimester with patient.

## 2023-08-13 NOTE — Patient Instructions (Signed)
Gestational Diabetes Mellitus, Diagnosis Gestational diabetes mellitus, or gestational diabetes, is diabetes that some people get when pregnant. This condition usually happens at 24-28 weeks of pregnancy.  People with gestational diabetes have high blood sugar. If you do not get treated for this condition, it may cause problems for you and your baby. It goes away after you give birth but can happen again the next time you become pregnant. What are the causes? This condition is caused by changes in your body when you're pregnant. When these changes happen: The pancreas may not make enough insulin. The body may not use insulin in the right way. This is called insulin resistance. Insulin helps your body use sugar for energy. If your body doesn't have enough insulin or can't use the insulin that it has, extra sugars stay in your blood. This leads to high blood sugar (hyperglycemia). What increases the risk? Being older than age 70 when pregnant. Too much body weight. Having polycystic ovary syndrome (PCOS). Having someone with diabetes in your family. Having had this condition in the past. Being pregnant with more than one baby. What are the signs or symptoms? You may not have any symptoms. If you do have symptoms, they may include: Being thirsty often. Being hungry often. Needing to pee more often. These symptoms can be missed because they're similar to other symptoms of pregnancy. How is this diagnosed? This condition may be diagnosed based on your blood sugar level and how your body responds to glucose. This may be checked with an oral glucose tolerance test (OGTT). The test may be done: Early in pregnancy if you have risk factors. At 24-28 weeks of your pregnancy. How is this treated? To treat this condition, you may be told to: Eat a healthy diet. Get more exercise. Check your blood sugar often. Your health care provider will tell you what your target is. Take insulin and other  medicines. These are taken if needed. Work with a diabetes expert. Follow these instructions at home: Learn about your diabetes Ask your provider: How often should I check my blood sugar? Where do I get the equipment? What medicines do I need? When should I take them? Do I need to meet with an educator? Who can I call if I have questions? Where can I find a support group? General instructions Take medicines only as told by your provider. Stay at a healthy weight. Drink enough fluid to keep your pee (urine) pale yellow. Check your pee for ketones when sick and as told. Ketones in your pee is a sign that your body is using fat for energy because it's not making enough insulin. Wear an alert bracelet or carry a card that shows you have this condition. Keep all follow-up visits. Your provider needs to check your health and your baby's growth. Where to find more information Gestational Diabetes: American Diabetes Association (ADA): diabetes.org Gestational Diabetes and Pregnancy: Centers for Disease Control and Prevention (CDC): TonerPromos.no American Pregnancy Association: americanpregnancy.org [place] MyPlate: WrestlingReporter.dk Contact a health care provider if:  Your blood sugar is above your target for two tests in a row. You have a high blood sugar level and you also have ketones in your pee. You have been sick or have had a fever for 2 days or more and aren't getting better. You have any of these problems for more than 6 hours: You can't eat or drink. You vomit or feel like you may vomit. You have watery poop (diarrhea). Get help right away if:  You become confused or cannot think clearly. You have trouble breathing. You have chest pain. Your baby is moving less than usual. You have unusual discharge or bleeding from your vagina. You have cramping in your belly or have pain in your hips or lower back. You have symptoms of high blood pressure or preeclampsia. These include: A severe,  throbbing headache that doesn't go away. Sudden or extreme swelling of your face, hands, legs, or feet. Vision problems, such as: Seeing spots. Blurry vision. Sensitivity to light. These symptoms may be an emergency. Get help right away. Call 911. Do not wait to see if the symptoms will go away. Do not drive yourself to the hospital. This information is not intended to replace advice given to you by your health care provider. Make sure you discuss any questions you have with your health care provider. Document Revised: 11/30/2022 Document Reviewed: 11/30/2022 Elsevier Patient Education  2024 Elsevier Inc.  Second Trimester of Pregnancy  The second trimester of pregnancy is from week 13 through week 27. This is months 4 through 6 of pregnancy. The second trimester is often a time when you feel your best. Your body has adjusted to being pregnant, and you begin to feel better physically. During the second trimester: Morning sickness has lessened or stopped completely. You may have more energy. You may have an increase in appetite. The second trimester is also a time when the unborn baby (fetus) is growing rapidly. At the end of the sixth month, the fetus may be up to 12 inches long and weigh about 1 pounds. You will likely begin to feel the baby move (quickening) between 16 and 20 weeks of pregnancy. Body changes during your second trimester Your body continues to go through many changes during your second trimester. The changes vary and generally return to normal after the baby is born. Physical changes Your weight will continue to increase. You will notice your lower abdomen bulging out. You may begin to get stretch marks on your hips, abdomen, and breasts. Your breasts will continue to grow and to become tender. Dark spots or blotches (chloasma or mask of pregnancy) may develop on your face. A dark line from your belly button to the pubic area (linea nigra) may appear. You may have  changes in your hair. These can include thickening of your hair, rapid growth, and changes in texture. Some people also have hair loss during or after pregnancy, or hair that feels dry or thin. Health changes You may develop headaches. You may have heartburn. You may develop constipation. You may develop hemorrhoids or swollen, bulging veins (varicose veins). Your gums may bleed and may be sensitive to brushing and flossing. You may urinate more often because the fetus is pressing on your bladder. You may have back pain. This is caused by: Weight gain. Pregnancy hormones that are relaxing the joints in your pelvis. A shift in weight and the muscles that support your balance. Follow these instructions at home: Medicines Follow your health care provider's instructions regarding medicine use. Specific medicines may be either safe or unsafe to take during pregnancy. Do not take any medicines unless approved by your health care provider. Take a prenatal vitamin that contains at least 600 micrograms (mcg) of folic acid. Eating and drinking Eat a healthy diet that includes fresh fruits and vegetables, whole grains, good sources of protein such as meat, eggs, or tofu, and low-fat dairy products. Avoid raw meat and unpasteurized juice, milk, and cheese. These carry germs  that can harm you and your baby. You may need to take these actions to prevent or treat constipation: Drink enough fluid to keep your urine pale yellow. Eat foods that are high in fiber, such as beans, whole grains, and fresh fruits and vegetables. Limit foods that are high in fat and processed sugars, such as fried or sweet foods. Activity Exercise only as directed by your health care provider. Most people can continue their usual exercise routine during pregnancy. Try to exercise for 30 minutes at least 5 days a week. Stop exercising if you develop contractions in your uterus. Stop exercising if you develop pain or cramping in the  lower abdomen or lower back. Avoid exercising if it is very hot or humid or if you are at a high altitude. Avoid heavy lifting. If you choose to, you may have sex unless your health care provider tells you not to. Relieving pain and discomfort Wear a supportive bra to prevent discomfort from breast tenderness. Take warm sitz baths to soothe any pain or discomfort caused by hemorrhoids. Use hemorrhoid cream if your health care provider approves. Rest with your legs raised (elevated) if you have leg cramps or low back pain. If you develop varicose veins: Wear support hose as told by your health care provider. Elevate your feet for 15 minutes, 3-4 times a day. Limit salt in your diet. Safety Wear your seat belt at all times when driving or riding in a car. Talk with your health care provider if someone is verbally or physically abusive to you. Lifestyle Do not use hot tubs, steam rooms, or saunas. Do not douche. Do not use tampons or scented sanitary pads. Avoid cat litter boxes and soil used by cats. These carry germs that can cause birth defects in the baby and possibly loss of the fetus by miscarriage or stillbirth. Do not use herbal remedies, alcohol, illegal drugs, or medicines that are not approved by your health care provider. Chemicals in these products can harm your baby. Do not use any products that contain nicotine or tobacco, such as cigarettes, e-cigarettes, and chewing tobacco. If you need help quitting, ask your health care provider. General instructions During a routine prenatal visit, your health care provider will do a physical exam and other tests. He or she will also discuss your overall health. Keep all follow-up visits. This is important. Ask your health care provider for a referral to a local prenatal education class. Ask for help if you have counseling or nutritional needs during pregnancy. Your health care provider can offer advice or refer you to specialists for help  with various needs. Where to find more information American Pregnancy Association: americanpregnancy.org [employer]ologists: https://www.todd-brady.net/ Office on [employer] Health: MightyReward.co.nz Contact a health care provider if you have: A headache that does not go away when you take medicine. Vision changes or you see spots in front of your eyes. Mild pelvic cramps, pelvic pressure, or nagging pain in the abdominal area. Persistent nausea, vomiting, or diarrhea. A bad-smelling vaginal discharge or foul-smelling urine. Pain when you urinate. Sudden or extreme swelling of your face, hands, ankles, feet, or legs. A fever. Get help right away if you: Have fluid leaking from your vagina. Have spotting or bleeding from your vagina. Have severe abdominal cramping or pain. Have difficulty breathing. Have chest pain. Have fainting spells. Have not felt your baby move for the time period told by your health care provider. Have new or increased pain, swelling, or  redness in an arm or leg. Summary The second trimester of pregnancy is from week 13 through week 27 (months 4 through 6). Do not use herbal remedies, alcohol, illegal drugs, or medicines that are not approved by your health care provider. Chemicals in these products can harm your baby. Exercise only as directed by your health care provider. Most people can continue their usual exercise routine during pregnancy. Keep all follow-up visits. This is important. This information is not intended to replace advice given to you by your health care provider. Make sure you discuss any questions you have with your health care provider. Document Revised: 01/27/2020 Document Reviewed: 12/03/2019 Elsevier Patient Education  2024 [employer].

## 2023-08-13 NOTE — Progress Notes (Signed)
    Return Prenatal Note   Subjective   27 y.o. G1P0000 at 107w3d presents for this follow-up prenatal visit.  Patient reports 2-3w of dark brown vaginal discharge, no bright red bleeding, no pain, itching, burning or dysuria. Patient reports: Movement: Present Contractions: Not present  Objective   Flow sheet Vitals: Pulse Rate: (!) 115 BP: 119/71 Fundal Height: 24 cm Fetal Heart Rate (bpm): 150 Total weight gain: 62 lb 1.6 oz (28.2 kg)  General Appearance  No acute distress, well appearing, and well nourished Pulmonary   Normal work of breathing Genitourinary   Speculum exam reveals posterior visually closed cervix, Aptima collected. Physiologic discharge present, no bleeding noted. Neurologic   Alert and oriented to person, place, and time Psychiatric   Mood and affect within normal limits  Assessment/Plan   Plan  27 y.o. G1P0000 at [redacted]w[redacted]d presents for follow-up OB visit. Reviewed prenatal record including previous visit note.  Single umbilical artery affecting management of mother in singleton pregnancy, antepartum MFM consult ordered, pending scheduling. Reviewed serial growth scans in 3rd trimester with patient.  Supervision of normal pregnancy Red flag symptoms reviewed. Anticipatory guidance for 28w labs, TDAP discussed. Aptima swab sent for dark brown discharge, reassured of no active bleeding or evidence of preterm labor on speculum exam       Orders Placed This Encounter  Procedures   28 Week RH+Panel    Standing Status:   Future    Standing Expiration Date:   08/10/2024   POCT Urinalysis Dipstick   Return in 4 weeks (on 09/10/2023) for ROB & GDM screening.   Future Appointments  Date Time Provider Department Center  09/10/2023  2:20 PM [facility] None  09/10/2023  2:55 PM Dominic, Courtney Heys, CNM [facility] None    For next visit:  ROB with 1 hour glucola, third trimester labs, and Tdap     Dominica Severin, CNM  12/10/244:56 PM

## 2023-08-14 NOTE — Addendum Note (Signed)
Addended by: Kathlene Cote on: 08/14/2023 10:55 AM   Modules accepted: Orders

## 2023-08-15 LAB — CERVICOVAGINAL ANCILLARY ONLY
Bacterial Vaginitis (gardnerella): NEGATIVE
Candida Glabrata: NEGATIVE
Candida Vaginitis: NEGATIVE
Comment: NEGATIVE
Comment: NEGATIVE
Comment: NEGATIVE

## 2023-08-19 ENCOUNTER — Telehealth: Payer: Self-pay

## 2023-08-19 NOTE — Telephone Encounter (Signed)
Patient is aware 

## 2023-08-19 NOTE — Telephone Encounter (Signed)
NO DPR ON FILE. Left voicemail to return call.  When patient returns call, please advise Per MFM, patient can be re-scanned at our office. She is scheduled for 09/10/23. If there is still a concern at that scan, then she will be sent to MFM.

## 2023-08-19 NOTE — Telephone Encounter (Signed)
Sandra Marsh called triage line as she was wondering if her referral to MFM was placed and how long would it take for someone to call her to get her scheduled. I can see there referral was placed on 08/07/2023

## 2023-08-29 ENCOUNTER — Telehealth: Payer: Self-pay

## 2023-08-29 ENCOUNTER — Encounter: Payer: Self-pay | Admitting: Advanced Practice Midwife

## 2023-08-29 NOTE — Telephone Encounter (Signed)
Patient called.  Patient aware.  

## 2023-08-29 NOTE — Telephone Encounter (Signed)
If her pain has not improved, I would recommend she schedule an appointment to be seen in the office soon, or go to L&D for further evaluation if the pain is significant. Sandra Marsh

## 2023-08-29 NOTE — Telephone Encounter (Signed)
Patient called back. She continues to have pain in her lower pelvic area and back. She rates pain as a 6/10. Recommended Tylenol, Support Belt, warm soaks in the tub and she has been wearing a support belt and taking Tylenol with no relief. Please advise.

## 2023-08-29 NOTE — Telephone Encounter (Signed)
Patient called with concerns of an intense dull ache in her lower pelvic area and radiates to her lower back x 1 day. She has increased pain when she goes from sitting to walking. Pain is constant hurts when laying down and sitting down. Please advise.

## 2023-09-04 NOTE — L&D Delivery Note (Signed)
 Delivery Note   Sandra Marsh is a 28 y.o. G1P0000 at [redacted]w[redacted]d Estimated Date of Delivery: 11/30/23  PRE-OPERATIVE DIAGNOSIS:  1) [redacted]w[redacted]d pregnancy.  2) Single umbilical artery 3)Marginal cord insertion 4)IUGR  POST-OPERATIVE DIAGNOSIS:  1) [redacted]w[redacted]d pregnancy s/p Vaginal, Spontaneous   Delivery Type: Vaginal, Spontaneous   Delivery Anesthesia: Epidural  Labor Complications:  none    ESTIMATED BLOOD LOSS: 100 ml    FINDINGS:   1) female infant, Apgar scores of 9   at 1 minute and 10   at 5 minutes and a birthweight of pending   SPECIMENS:   PLACENTA:   Appearance: Intact   Removal: Spontaneous     Disposition:  disposed   CORD BLOOD: Collected/Not Indicated  DISPOSITION:  Infant left in stable condition in the delivery room, with L&D personnel and mother,  NARRATIVE SUMMARY: Labor course:  Sandra Marsh is a G1P0000 at [redacted]w[redacted]d who presented to Labor & Delivery for induction of labor. Labor proceeded with augmentation and she was found to be completely dilated at 10:16 AM. With excellent maternal pushing effort, she birthed a viable  infant at 10:48 AM. There was/was not a nuchal cord. The shoulders were birthed without difficulty. The infant was placed skin-to-skin with mom. The cord was doubly clamped and cut when pulsations ceased. The placenta delivered spontaneously at 30 minutes after birth and was noted to be intact with a 2VC and marginal insertion. A perineal and vaginal examination was performed and found to be intact.   Oley Balm, CNM  11/27/2023 11:39 AM

## 2023-09-10 ENCOUNTER — Ambulatory Visit: Payer: BC Managed Care – PPO

## 2023-09-10 ENCOUNTER — Ambulatory Visit: Payer: BC Managed Care – PPO | Admitting: Licensed Practical Nurse

## 2023-09-10 ENCOUNTER — Other Ambulatory Visit: Payer: BC Managed Care – PPO

## 2023-09-10 ENCOUNTER — Encounter: Payer: Self-pay | Admitting: Licensed Practical Nurse

## 2023-09-10 VITALS — BP 137/85 | HR 105 | Wt 221.9 lb

## 2023-09-10 DIAGNOSIS — Z113 Encounter for screening for infections with a predominantly sexual mode of transmission: Secondary | ICD-10-CM | POA: Diagnosis not present

## 2023-09-10 DIAGNOSIS — Z3402 Encounter for supervision of normal first pregnancy, second trimester: Secondary | ICD-10-CM | POA: Diagnosis not present

## 2023-09-10 DIAGNOSIS — O468X3 Other antepartum hemorrhage, third trimester: Secondary | ICD-10-CM

## 2023-09-10 DIAGNOSIS — Z131 Encounter for screening for diabetes mellitus: Secondary | ICD-10-CM

## 2023-09-10 DIAGNOSIS — Z114 Encounter for screening for human immunodeficiency virus [HIV]: Secondary | ICD-10-CM | POA: Diagnosis not present

## 2023-09-10 DIAGNOSIS — O43193 Other malformation of placenta, third trimester: Secondary | ICD-10-CM | POA: Diagnosis not present

## 2023-09-10 DIAGNOSIS — Q270 Congenital absence and hypoplasia of umbilical artery: Secondary | ICD-10-CM

## 2023-09-10 DIAGNOSIS — O468X2 Other antepartum hemorrhage, second trimester: Secondary | ICD-10-CM

## 2023-09-10 DIAGNOSIS — O43192 Other malformation of placenta, second trimester: Secondary | ICD-10-CM

## 2023-09-10 DIAGNOSIS — O09899 Supervision of other high risk pregnancies, unspecified trimester: Secondary | ICD-10-CM

## 2023-09-10 DIAGNOSIS — Z3A28 28 weeks gestation of pregnancy: Secondary | ICD-10-CM | POA: Diagnosis not present

## 2023-09-10 DIAGNOSIS — Z23 Encounter for immunization: Secondary | ICD-10-CM

## 2023-09-10 DIAGNOSIS — Z3403 Encounter for supervision of normal first pregnancy, third trimester: Secondary | ICD-10-CM

## 2023-09-10 NOTE — Progress Notes (Signed)
 Routine Prenatal Care Visit  Subjective  Sandra Marsh is a 28 y.o. G1P0000 at [redacted]w[redacted]d being seen today for ongoing prenatal care.  She is currently monitored for the following issues for this high-risk pregnancy and has Anxiety; MDD (major depressive disorder), recurrent severe, without psychosis (HCC); Supervision of normal pregnancy; Single umbilical artery affecting management of mother in singleton pregnancy, antepartum; and Marginal insertion of umbilical cord affecting management of mother in second trimester on their problem list.  ----------------------------------------------------------------------------------- Patient reports no complaints.   -has not signed up for CBE but has for BF classes -mood has been good -had US  today:  Growth is in the  13thth percentile   AC is in the 17th percentile. EFW: 1091g, 2lb 6oz -TWG 71 lbs BMI now 43, admits she has been eating poorly over the holidays. POC for hugh  BMI reviewed.  -has questions about delivery planing in setting of SUA, still needs MFM consult. Will make delivery plans based on recommendations.     Pt asked about delivery planning. At this time timing is not certain. Will make plans based off of MFM recommendations. Still needs MFM apt.    -Spots on abd  Contractions: Irritability. Vag. Bleeding: None.  Movement: Present. Leaking Fluid denies.  ----------------------------------------------------------------------------------- The following portions of the patient's history were reviewed and updated as appropriate: allergies, current medications, past family history, past medical history, past social history, past surgical history and problem list. Problem list updated.  Objective  Blood pressure 137/85, pulse (!) 105, weight 221 lb 14.4 oz (100.7 kg), last menstrual period 02/23/2023. Pregravid weight 150 lb (68 kg) Total Weight Gain 71 lb 14.4 oz (32.6 kg) Urinalysis: Urine Protein    Urine Glucose    Fetal Status: Fetal  Heart Rate (bpm): 140 Fundal Height: 29 cm Movement: Present     General:  Alert, oriented and cooperative. Patient is in no acute distress.  Skin: Skin is warm and dry. No rash noted.   Cardiovascular: Normal heart rate noted  Respiratory: Normal respiratory effort, no problems with respiration noted  Abdomen: Soft, gravid, appropriate for gestational age. Pain/Pressure: Present     Pelvic:  Cervical exam deferred        Extremities: Normal range of motion.  Edema: Trace  Mental Status: Normal mood and affect. Normal behavior. Normal judgment and thought content.   Assessment   28 y.o. G1P0000 at [redacted]w[redacted]d by  11/30/2023, Date entered prior to episode creation presenting for routine prenatal visit  Plan   first Problems (from 04/22/23 to present)     Problem Noted Diagnosed Resolved   Marginal insertion of umbilical cord affecting management of mother in second trimester 08/13/2023 by Jayne Harlene CROME, CNM  No   Supervision of normal pregnancy 04/22/2023 by Vicci Levan, CMA  No   Overview Addendum 09/10/2023  3:21 PM by Donelda Burnard CROME, CMA   Clinical Staff Provider  Office Location  [facility] Dating  By LMP  Language  English Anatomy US   SUA, marginal cord insertion  Flu Vaccine  06/18/2023 Genetic Screen  NIPS: Panorama low risk, female   TDaP vaccine  09/10/23 Hgb A1C or  GTT Early : Third trimester :   Covid UTD   LAB RESULTS   Rhogam  O/Positive/-- (09/17 1600)  Blood Type O/Positive/-- (09/17 1600)   Feeding Plan breast Antibody Negative (09/17 1600)  Contraception pill Rubella 1.33 (09/17 1600)  Circumcision yes RPR Non Reactive (09/17 1600)   Pediatrician  undecided HBsAg Negative (09/17  1600)   Support Person [place] HIV Non Reactive (09/17 1600)  Prenatal Classes yes Varicella 1,092 (09/17 1600)    GBS  (For PCN allergy, check sensitivities)   BTL Consent  Hep C Non Reactive (09/17 1600)   VBAC Consent  Pap Diagnosis  Date Value Ref Range Status  05/21/2023    Final   - Negative for intraepithelial lesion or malignancy (NILM)      Hgb Electro      CF      SMA                    Preterm labor symptoms and general obstetric precautions including but not limited to vaginal bleeding, contractions, leaking of fluid and fetal movement were reviewed in detail with the patient. Please refer to After Visit Summary for other counseling recommendations.   Return in about 2 weeks (around 09/24/2023) for ROB.  28 wk labs collected TDAP given today   Jinnie Delinda HOWARD   [facility]  09/10/23  4:57 PM

## 2023-09-11 LAB — 28 WEEK RH+PANEL
Basophils Absolute: 0 10*3/uL (ref 0.0–0.2)
Basos: 0 %
EOS (ABSOLUTE): 0.2 10*3/uL (ref 0.0–0.4)
Eos: 2 %
Gestational Diabetes Screen: 107 mg/dL (ref 70–139)
HIV Screen 4th Generation wRfx: NONREACTIVE
Hematocrit: 32.7 % — ABNORMAL LOW (ref 34.0–46.6)
Hemoglobin: 10.9 g/dL — ABNORMAL LOW (ref 11.1–15.9)
Lymphocytes Absolute: 2.8 10*3/uL (ref 0.7–3.1)
Lymphs: 23 %
MCH: 28.7 pg (ref 26.6–33.0)
MCHC: 33.3 g/dL (ref 31.5–35.7)
MCV: 86 fL (ref 79–97)
Monocytes Absolute: 0.5 10*3/uL (ref 0.1–0.9)
Monocytes: 4 %
Neutrophils Absolute: 8.1 10*3/uL — ABNORMAL HIGH (ref 1.4–7.0)
Neutrophils: 67 %
Platelets: 308 10*3/uL (ref 150–450)
RBC: 3.8 x10E6/uL (ref 3.77–5.28)
RDW: 13.1 % (ref 11.7–15.4)
RPR Ser Ql: NONREACTIVE
WBC: 12.1 10*3/uL — ABNORMAL HIGH (ref 3.4–10.8)

## 2023-09-11 LAB — IMMATURE CELLS
MYELOCYTES: 2 % — ABNORMAL HIGH (ref 0–0)
Metamyelocytes: 2 % — ABNORMAL HIGH (ref 0–0)

## 2023-09-12 ENCOUNTER — Encounter: Payer: Self-pay | Admitting: Obstetrics and Gynecology

## 2023-09-18 DIAGNOSIS — F603 Borderline personality disorder: Secondary | ICD-10-CM | POA: Diagnosis not present

## 2023-09-19 DIAGNOSIS — F39 Unspecified mood [affective] disorder: Secondary | ICD-10-CM | POA: Diagnosis not present

## 2023-09-19 DIAGNOSIS — F411 Generalized anxiety disorder: Secondary | ICD-10-CM | POA: Diagnosis not present

## 2023-09-19 DIAGNOSIS — F902 Attention-deficit hyperactivity disorder, combined type: Secondary | ICD-10-CM | POA: Diagnosis not present

## 2023-09-19 DIAGNOSIS — F5105 Insomnia due to other mental disorder: Secondary | ICD-10-CM | POA: Diagnosis not present

## 2023-09-22 ENCOUNTER — Encounter: Payer: Self-pay | Admitting: Obstetrics and Gynecology

## 2023-09-24 ENCOUNTER — Ambulatory Visit (INDEPENDENT_AMBULATORY_CARE_PROVIDER_SITE_OTHER): Payer: BC Managed Care – PPO | Admitting: Obstetrics and Gynecology

## 2023-09-24 ENCOUNTER — Encounter: Payer: Self-pay | Admitting: Obstetrics and Gynecology

## 2023-09-24 VITALS — BP 118/72 | HR 97 | Wt 227.7 lb

## 2023-09-24 DIAGNOSIS — F419 Anxiety disorder, unspecified: Secondary | ICD-10-CM

## 2023-09-24 DIAGNOSIS — Z3403 Encounter for supervision of normal first pregnancy, third trimester: Secondary | ICD-10-CM

## 2023-09-24 DIAGNOSIS — Z3A30 30 weeks gestation of pregnancy: Secondary | ICD-10-CM

## 2023-09-24 DIAGNOSIS — O09899 Supervision of other high risk pregnancies, unspecified trimester: Secondary | ICD-10-CM

## 2023-09-24 DIAGNOSIS — G479 Sleep disorder, unspecified: Secondary | ICD-10-CM | POA: Insufficient documentation

## 2023-09-24 DIAGNOSIS — O99343 Other mental disorders complicating pregnancy, third trimester: Secondary | ICD-10-CM

## 2023-09-24 DIAGNOSIS — F32A Depression, unspecified: Secondary | ICD-10-CM

## 2023-09-24 DIAGNOSIS — R102 Pelvic and perineal pain unspecified side: Secondary | ICD-10-CM | POA: Insufficient documentation

## 2023-09-24 DIAGNOSIS — O26893 Other specified pregnancy related conditions, third trimester: Secondary | ICD-10-CM

## 2023-09-24 DIAGNOSIS — O365930 Maternal care for other known or suspected poor fetal growth, third trimester, not applicable or unspecified: Secondary | ICD-10-CM | POA: Insufficient documentation

## 2023-09-24 LAB — POCT URINALYSIS DIPSTICK OB
Bilirubin, UA: NEGATIVE
Blood, UA: NEGATIVE
Glucose, UA: NEGATIVE
Ketones, UA: NEGATIVE
Leukocytes, UA: NEGATIVE
Nitrite, UA: NEGATIVE
POC,PROTEIN,UA: NEGATIVE
Spec Grav, UA: 1.015 (ref 1.010–1.025)
Urobilinogen, UA: 0.2 U/dL
pH, UA: 6 (ref 5.0–8.0)

## 2023-09-24 NOTE — Progress Notes (Signed)
ROB: Patient is a 28 y.o. G1P0000 at [redacted]w[redacted]d who presents for routine OB care.  Pregnancy is complicated by:  Anxiety; MDD (major depressive disorder), recurrent severe, without psychosis (HCC); Supervision of normal pregnancy; Single umbilical artery affecting management of mother in singleton pregnancy, antepartum; and Marginal insertion of umbilical cord affecting management of mother in second trimester on their problem list..    Patient has several concerns today:    Lamicatal and breastfeeding - currently stable on this medication for her anxiety and depression. Has discussed with her psychiatrist whether or not she can breastfeed. Noted that they deferred to OB. Notes that she was told that there is low risk of transmission of medicaiton through breast milk, and that patient's levels would need to be monitored. Discussed that it is patient's decision if she desires to breastfeed, and long-term infant should have no major issues.   Pelvic pain/pressure - has noted this over the past several weeks. Has tried Tylenol, warm baths, has a belly binder.  Discussed other methods including birthing balls, pelvic floor physical therapy or chiropractor. Also given info on [employer] website for additional comfort measures.   Sleep disturbances - sleeping only 4-5 hrs. Falling asleep is ok, staying asleep is hard. Urinates 3-4 times per night which wakes her up and then stays awake for 30-60 minutes. Has tried Benadryl in the past which helps, but just does not want to have to take it every night. Discussed other remedies such as warm milk before bed, sleep teas, magnesium supplements, sleep sounds, etc.  Patient reports she cuts her fluid intake off ~ 1 hr prior to bed. Typically goes to bed around 8:30 at night, gets up at :15. Advised on attempting to push out bedtime, do mild physical activities that may lead to increase in tiredness at bedtime.   Reviewed recent growth [place], on 17, growth at 13%ile,  normal AFI, subchorionic hemorrhage resolved. Discussed need for further surveillance, has next scan in 3 weeks.  Will likely need referral to Anesthesia by next visit for BMI. Plan for antenatal testing beginning at 34-36 weeks Normal 28 week labs. . RTC in 2 weeks.

## 2023-09-24 NOTE — Progress Notes (Signed)
ROB [redacted]w[redacted]d: She is doing well. She reports good fetal movement. She has pain in her right groin area, that makes it hard for her to move around and does not resolve with Tylenol.

## 2023-10-02 DIAGNOSIS — F603 Borderline personality disorder: Secondary | ICD-10-CM | POA: Diagnosis not present

## 2023-10-08 ENCOUNTER — Ambulatory Visit (INDEPENDENT_AMBULATORY_CARE_PROVIDER_SITE_OTHER): Payer: BC Managed Care – PPO | Admitting: Certified Nurse Midwife

## 2023-10-08 VITALS — BP 106/74 | HR 121 | Wt 228.2 lb

## 2023-10-08 DIAGNOSIS — O43192 Other malformation of placenta, second trimester: Secondary | ICD-10-CM

## 2023-10-08 DIAGNOSIS — Z3A32 32 weeks gestation of pregnancy: Secondary | ICD-10-CM | POA: Diagnosis not present

## 2023-10-08 DIAGNOSIS — O09893 Supervision of other high risk pregnancies, third trimester: Secondary | ICD-10-CM | POA: Diagnosis not present

## 2023-10-08 DIAGNOSIS — O43193 Other malformation of placenta, third trimester: Secondary | ICD-10-CM | POA: Diagnosis not present

## 2023-10-08 DIAGNOSIS — Z3403 Encounter for supervision of normal first pregnancy, third trimester: Secondary | ICD-10-CM

## 2023-10-08 DIAGNOSIS — O09899 Supervision of other high risk pregnancies, unspecified trimester: Secondary | ICD-10-CM

## 2023-10-08 NOTE — Patient Instructions (Signed)
 Third Trimester of Pregnancy  The third trimester of pregnancy is from week 28 through week 40. This is months 7 through 9. The third trimester is a time when your baby is growing fast. Body changes during your third trimester Your body continues to change during this time. The changes usually go away after your baby is born. Physical changes You will continue to gain weight. You may get stretch marks on your hips, belly, and breasts. Your breasts will keep growing and may hurt. A yellow fluid (colostrum) may leak from your breasts. This is the first milk you're making for your baby. Your hair may grow faster and get thicker. In some cases, you may get hair loss. Your belly button may stick out. You may have more swelling in your hands, face, or ankles. Health changes You may have heartburn. You may feel short of breath. This is caused by the uterus that is now bigger. You may have more aches in the pelvis, back, or thighs. You may have more tingling or numbness in your hands, arms, and legs. You may pee more often. You may have trouble pooping (constipation) or swollen veins in the butt that can itch or get painful (hemorrhoids). Other changes You may have more problems sleeping. You may notice the baby moving lower in your belly (dropping). You may have more fluid coming from your vagina. Your joints may feel loose, and you may have pain around your pelvic bone. Follow these instructions at home: Medicines Take medicines only as told by your health care provider. Some medicines are not safe during pregnancy. Your provider may change the medicines that you take. Do not take any medicines unless told to by your provider. Take a prenatal vitamin that has at least 600 micrograms (mcg) of folic acid. Do not use herbal medicines, illegal drugs, or medicines that are not approved by your provider. Eating and drinking While you're pregnant your body needs additional nutrition to help  support your growing baby. Talk with your provider about your nutritional needs. Activity Most women are able to exercise regularly during pregnancy. Exercise routines may need to change at the end of your pregnancy. Talk to your provider about your activities and exercise routine. Relieving pain and discomfort Rest often with your legs raised if you have leg cramps or low back pain. Take warm sitz baths to soothe pain from hemorrhoids. Use hemorrhoid cream if your provider says it's okay. Wear a good, supportive bra if your breasts hurt. Do not use hot tubs, steam rooms, or saunas. Do not douche. Do not use tampons or scented pads. Safety Talk to your provider before traveling far distances. Wear your seatbelt at all times when you're in a car. Talk to your provider if someone hits you, hurts you, or yells at you. Preparing for birth To prepare for your baby: Take childbirth and breastfeeding classes. Visit the hospital and tour the maternity area. Buy a rear-facing car seat. Learn how to install it in your car. General instructions Avoid cat litter boxes and soil used by cats. These things carry germs that can cause harm to your pregnancy and your baby. Do not drink alcohol, smoke, vape, or use products with nicotine or tobacco in them. If you need help quitting, talk with your provider. Keep all follow-up visits for your third trimester. Your provider will do more exams and tests during this trimester. Write down your questions. Take them to your prenatal visits. Your provider also will: Talk with you about  your overall health. Give you advice or refer you to specialists who can help with different needs, including: Mental health and counseling. Foods and healthy eating. Ask for help if you need help with food. Where to find more information American Pregnancy Association: americanpregnancy.org [employer]necologists: acog.org Office on [employer] Health:  [employer] Contact a health care provider if: You have a headache that does not go away when you take medicine. You have any of these problems: You can't eat or drink. You have nausea and vomiting. You have watery poop (diarrhea) for 2 days or more. You have pain when you pee, or your pee smells bad. You have been sick for 2 days or more and aren't getting better. Contact your provider right away if: You have any of these coming from your vagina: Abnormal discharge. Bad-smelling fluid. Bleeding. Your baby is moving less than usual. You have signs of labor: You have any contractions, belly cramping, or have pain in your pelvis or lower back before 37 weeks of pregnancy (preterm labor). You have regular contractions that are less than 5 minutes apart. Your water breaks. You have symptoms of high blood pressure or preeclampsia. These include: A severe, throbbing headache that does not go away. Sudden or extreme swelling of your face, hands, legs, or feet. Vision problems: You see spots. You have blurry vision. Your eyes are sensitive to light. If you can't reach your provider, go to an urgent care or emergency room. Get help right away if: You faint, become confused, or can't think clearly. You have chest pain or trouble breathing. You have any kind of injury, such as from a fall or a car crash. These symptoms may be an emergency. Call 911 right away. Do not wait to see if the symptoms will go away. Do not drive yourself to the hospital. This information is not intended to replace advice given to you by your health care provider. Make sure you discuss any questions you have with your health care provider. Document Revised: 05/23/2023 Document Reviewed: 12/21/2022 Elsevier Patient Education  2024 [employer].

## 2023-10-08 NOTE — Progress Notes (Signed)
    Return Prenatal Note   Subjective   28 y.o. G1P0000 at [redacted]w[redacted]d presents for this follow-up prenatal visit.  Patient feeling well, some lower back pain. Has been able to decrease travel for work. Taking online CBEd, birth plan handout reviewed. Patient reports: Movement: Present Contractions: Not present  Objective   Flow sheet Vitals: Pulse Rate: (!) 121 BP: 106/74 Fundal Height: 33 cm Fetal Heart Rate (bpm): 150 Total weight gain: 78 lb 3.2 oz (35.5 kg)  General Appearance  No acute distress, well appearing, and well nourished Pulmonary   Normal work of breathing Neurologic   Alert and oriented to person, place, and time Psychiatric   Mood and affect within normal limits  Assessment/Plan   Plan  28 y.o. G1P0000 at [redacted]w[redacted]d presents for follow-up OB visit. Reviewed prenatal record including previous visit note.  Single umbilical artery affecting management of mother in singleton pregnancy, antepartum Growth scan as scheduled 2/7.  Supervision of normal pregnancy Reviewed kick counts and preterm labor warning signs. Instructed to call office or come to hospital with persistent headache, vision changes, regular contractions, leaking of fluid, decreased fetal movement or vaginal bleeding.   BMI remains <45 at today's visit, plan anesthesia consult if >45 at next visit.   No orders of the defined types were placed in this encounter.  Return in 2 weeks (on 10/22/2023) for ROB.   Future Appointments  Date Time Provider Department Center  10/11/2023  2:00 PM [facility] US  1 [facility] None  10/22/2023  2:55 PM Dominic, Jinnie Jansky, CNM [facility] None  11/05/2023  1:55 PM Leigh Sober, MD [facility] None    For next visit:  continue with routine prenatal care     Harlene LITTIE Cisco, CNM  02/04/252:57 PM

## 2023-10-08 NOTE — Assessment & Plan Note (Signed)
-   Reviewed kick counts and preterm labor warning signs. Instructed to call office or come to hospital with persistent headache, vision changes, regular contractions, leaking of fluid, decreased fetal movement or vaginal bleeding.

## 2023-10-08 NOTE — Assessment & Plan Note (Signed)
Growth scan as scheduled 2/7.

## 2023-10-11 ENCOUNTER — Ambulatory Visit (INDEPENDENT_AMBULATORY_CARE_PROVIDER_SITE_OTHER): Payer: BC Managed Care – PPO

## 2023-10-11 ENCOUNTER — Other Ambulatory Visit: Payer: Self-pay | Admitting: Licensed Practical Nurse

## 2023-10-11 ENCOUNTER — Other Ambulatory Visit: Payer: Self-pay | Admitting: Obstetrics

## 2023-10-11 DIAGNOSIS — Z3A31 31 weeks gestation of pregnancy: Secondary | ICD-10-CM | POA: Diagnosis not present

## 2023-10-11 DIAGNOSIS — O99213 Obesity complicating pregnancy, third trimester: Secondary | ICD-10-CM

## 2023-10-11 DIAGNOSIS — Z3403 Encounter for supervision of normal first pregnancy, third trimester: Secondary | ICD-10-CM

## 2023-10-11 DIAGNOSIS — O358XX0 Maternal care for other (suspected) fetal abnormality and damage, not applicable or unspecified: Secondary | ICD-10-CM | POA: Diagnosis not present

## 2023-10-11 DIAGNOSIS — Q270 Congenital absence and hypoplasia of umbilical artery: Secondary | ICD-10-CM

## 2023-10-11 DIAGNOSIS — Z3A28 28 weeks gestation of pregnancy: Secondary | ICD-10-CM

## 2023-10-11 DIAGNOSIS — O365990 Maternal care for other known or suspected poor fetal growth, unspecified trimester, not applicable or unspecified: Secondary | ICD-10-CM

## 2023-10-17 ENCOUNTER — Other Ambulatory Visit: Payer: Self-pay | Admitting: Obstetrics and Gynecology

## 2023-10-17 DIAGNOSIS — O365930 Maternal care for other known or suspected poor fetal growth, third trimester, not applicable or unspecified: Secondary | ICD-10-CM

## 2023-10-17 DIAGNOSIS — O43192 Other malformation of placenta, second trimester: Secondary | ICD-10-CM

## 2023-10-17 DIAGNOSIS — O09899 Supervision of other high risk pregnancies, unspecified trimester: Secondary | ICD-10-CM

## 2023-10-17 DIAGNOSIS — O468X2 Other antepartum hemorrhage, second trimester: Secondary | ICD-10-CM

## 2023-10-17 DIAGNOSIS — Z0289 Encounter for other administrative examinations: Secondary | ICD-10-CM

## 2023-10-18 ENCOUNTER — Other Ambulatory Visit: Payer: BC Managed Care – PPO

## 2023-10-18 ENCOUNTER — Other Ambulatory Visit: Payer: Self-pay | Admitting: Licensed Practical Nurse

## 2023-10-18 ENCOUNTER — Encounter: Payer: Self-pay | Admitting: Obstetrics and Gynecology

## 2023-10-18 ENCOUNTER — Ambulatory Visit (INDEPENDENT_AMBULATORY_CARE_PROVIDER_SITE_OTHER): Payer: BC Managed Care – PPO | Admitting: Obstetrics and Gynecology

## 2023-10-18 VITALS — BP 114/77 | HR 105 | Wt 233.7 lb

## 2023-10-18 DIAGNOSIS — O99213 Obesity complicating pregnancy, third trimester: Secondary | ICD-10-CM | POA: Diagnosis not present

## 2023-10-18 DIAGNOSIS — O43192 Other malformation of placenta, second trimester: Secondary | ICD-10-CM | POA: Diagnosis not present

## 2023-10-18 DIAGNOSIS — Z3A33 33 weeks gestation of pregnancy: Secondary | ICD-10-CM

## 2023-10-18 DIAGNOSIS — O365930 Maternal care for other known or suspected poor fetal growth, third trimester, not applicable or unspecified: Secondary | ICD-10-CM | POA: Diagnosis not present

## 2023-10-18 DIAGNOSIS — O0993 Supervision of high risk pregnancy, unspecified, third trimester: Secondary | ICD-10-CM

## 2023-10-18 DIAGNOSIS — O09899 Supervision of other high risk pregnancies, unspecified trimester: Secondary | ICD-10-CM

## 2023-10-18 DIAGNOSIS — O365990 Maternal care for other known or suspected poor fetal growth, unspecified trimester, not applicable or unspecified: Secondary | ICD-10-CM | POA: Diagnosis not present

## 2023-10-18 NOTE — Progress Notes (Signed)
ROB: Patient is a 28 y.o. G1P0000 at [redacted]w[redacted]d who presents for routine OB care.  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   Patient has complaints of back pain x 1 day ~ 1 week ago that resolved with eating and moving around more. Has noted some postcoital bleeding recently however was light and quickly resolved.  Growth scan last week noting fetus at 9%ile (down from 13%ile 3 weeks prior).  Has MFM appointment in 1.5 weeks. Will continue weekly Dopplers until then.  Discussed possible need fo earlier delivery based on results (anywhere from 37-39 weeks).  Dopplers today wnl, BPP 8/8.  Undecided on Pediatrician.  RTC in 2 weeks

## 2023-10-18 NOTE — Progress Notes (Signed)
ROB [redacted]w[redacted]d: She is doing well. She reports good fetal movement and has no new concerns today.

## 2023-10-22 ENCOUNTER — Ambulatory Visit (INDEPENDENT_AMBULATORY_CARE_PROVIDER_SITE_OTHER): Payer: BC Managed Care – PPO | Admitting: Licensed Practical Nurse

## 2023-10-22 ENCOUNTER — Encounter: Payer: Self-pay | Admitting: Licensed Practical Nurse

## 2023-10-22 VITALS — BP 121/83 | HR 105 | Wt 232.0 lb

## 2023-10-22 DIAGNOSIS — O0993 Supervision of high risk pregnancy, unspecified, third trimester: Secondary | ICD-10-CM | POA: Diagnosis not present

## 2023-10-22 DIAGNOSIS — E669 Obesity, unspecified: Secondary | ICD-10-CM | POA: Diagnosis not present

## 2023-10-22 DIAGNOSIS — Z3403 Encounter for supervision of normal first pregnancy, third trimester: Secondary | ICD-10-CM

## 2023-10-22 DIAGNOSIS — O99213 Obesity complicating pregnancy, third trimester: Secondary | ICD-10-CM | POA: Diagnosis not present

## 2023-10-22 DIAGNOSIS — O365990 Maternal care for other known or suspected poor fetal growth, unspecified trimester, not applicable or unspecified: Secondary | ICD-10-CM

## 2023-10-22 DIAGNOSIS — Z3A34 34 weeks gestation of pregnancy: Secondary | ICD-10-CM

## 2023-10-22 DIAGNOSIS — Z6841 Body Mass Index (BMI) 40.0 and over, adult: Secondary | ICD-10-CM

## 2023-10-22 DIAGNOSIS — O365930 Maternal care for other known or suspected poor fetal growth, third trimester, not applicable or unspecified: Secondary | ICD-10-CM

## 2023-10-22 DIAGNOSIS — O09899 Supervision of other high risk pregnancies, unspecified trimester: Secondary | ICD-10-CM

## 2023-10-22 NOTE — Progress Notes (Signed)
    Return Prenatal Note   Subjective   28 y.o. G1P0000 at [redacted]w[redacted]d presents for this follow-up prenatal visit.  Patient here with Fayrene Fearing, doing well, taking online CBE,  Patient reports: Movement: Present Contractions: Not present  Objective   Flow sheet Vitals: Pulse Rate: (!) 105 BP: 121/83 Fundal Height: 34 cm Fetal Heart Rate (bpm): 125 Total weight gain: 82 lb (37.2 kg)  General Appearance  No acute distress, well appearing, and well nourished Pulmonary   Normal work of breathing Neurologic   Alert and oriented to person, place, and time Psychiatric   Mood and affect within normal limits  Assessment/Plan   Plan  27 y.o. G1P0000 at [redacted]w[redacted]d presents for follow-up OB visit. Reviewed prenatal record including previous visit note.  Single umbilical artery affecting management of mother in singleton pregnancy, antepartum -next [place] 10/28/23  Supervision of normal pregnancy -Taking online CBE, devising birth plan, most interested in Nitrous oxide -Fayrene Fearing will be her labor support, they have family out of town that may come for support.   Obesity in pregnancy, antepartum, third trimester -TWG 82 lbs, reports they eat out for many meals, rec trying to make healthier choices when eating out. Declines nutrition referral -Reviewed she will need IOL prior to 40 weeks,  -Anesthesia consult placed   IUGR (intrauterine growth restriction) affecting care of mother -MFM 2/24 -reviewed potential for earlier IOL than expected  -BPP 8/8 and normal Dopplers on 2/14      Orders Placed This Encounter  Procedures   Ambulatory referral to Anesthesiology    Referral Priority:   Routine    Referral Type:   Surgical    Referral Reason:   Specialty Services Required    Requested Specialty:   Anesthesiology    Number of Visits Requested:   1   Return in about 2 weeks (around 11/05/2023) for ROB, 36wk labs .   Future Appointments  Date Time Provider Department Center  10/28/2023 10:00 AM  [facility] NURSE [facility]  10/28/2023 10:15 AM [facility] PROVIDER 1 [facility]  10/28/2023 10:30 AM [facility] US1 [facility]  11/05/2023  1:55 PM Julieanne Manson, MD [facility] None    For next visit:  continue with routine prenatal care     Sandra Marsh [facility], CNM  02/18/254:55 PM

## 2023-10-22 NOTE — Assessment & Plan Note (Signed)
-  next [place] 10/28/23

## 2023-10-22 NOTE — Assessment & Plan Note (Signed)
-  Taking online CBE, devising birth plan, most interested in Nitrous oxide -Fayrene Fearing will be her labor support, they have family out of town that may come for support.

## 2023-10-22 NOTE — Assessment & Plan Note (Addendum)
-  MFM 2/24 -reviewed potential for earlier IOL than expected  -BPP 8/8 and normal Dopplers on 2/14

## 2023-10-22 NOTE — Assessment & Plan Note (Signed)
-  TWG 82 lbs, reports they eat out for many meals, rec trying to make healthier choices when eating out. Declines nutrition referral -Reviewed she will need IOL prior to 40 weeks,  -Anesthesia consult placed

## 2023-10-24 ENCOUNTER — Emergency Department: Payer: BC Managed Care – PPO

## 2023-10-24 ENCOUNTER — Telehealth: Payer: Self-pay

## 2023-10-24 ENCOUNTER — Emergency Department
Admission: EM | Admit: 2023-10-24 | Discharge: 2023-10-24 | Disposition: A | Discharge status: Home / Self Care | Payer: BC Managed Care – PPO | Attending: Emergency Medicine | Admitting: Emergency Medicine

## 2023-10-24 ENCOUNTER — Other Ambulatory Visit: Payer: Self-pay

## 2023-10-24 DIAGNOSIS — Z3493 Encounter for supervision of normal pregnancy, unspecified, third trimester: Secondary | ICD-10-CM

## 2023-10-24 DIAGNOSIS — Z3A34 34 weeks gestation of pregnancy: Secondary | ICD-10-CM | POA: Insufficient documentation

## 2023-10-24 DIAGNOSIS — R079 Chest pain, unspecified: Secondary | ICD-10-CM | POA: Insufficient documentation

## 2023-10-24 DIAGNOSIS — O26893 Other specified pregnancy related conditions, third trimester: Secondary | ICD-10-CM | POA: Insufficient documentation

## 2023-10-24 DIAGNOSIS — R000 Tachycardia, unspecified: Secondary | ICD-10-CM | POA: Insufficient documentation

## 2023-10-24 DIAGNOSIS — R042 Hemoptysis: Secondary | ICD-10-CM | POA: Diagnosis not present

## 2023-10-24 DIAGNOSIS — R600 Localized edema: Secondary | ICD-10-CM | POA: Diagnosis not present

## 2023-10-24 DIAGNOSIS — R0789 Other chest pain: Secondary | ICD-10-CM | POA: Diagnosis not present

## 2023-10-24 DIAGNOSIS — O99893 Other specified diseases and conditions complicating puerperium: Secondary | ICD-10-CM | POA: Diagnosis not present

## 2023-10-24 LAB — URIC ACID: Uric Acid, Serum: 3.9 mg/dL (ref 2.5–7.1)

## 2023-10-24 LAB — CBC WITH DIFFERENTIAL/PLATELET
Abs Immature Granulocytes: 1.04 10*3/uL — ABNORMAL HIGH (ref 0.00–0.07)
Basophils Absolute: 0.1 10*3/uL (ref 0.0–0.1)
Basophils Relative: 0 %
Eosinophils Absolute: 0.2 10*3/uL (ref 0.0–0.5)
Eosinophils Relative: 1 %
HCT: 37.3 % (ref 36.0–46.0)
Hemoglobin: 11.6 g/dL — ABNORMAL LOW (ref 12.0–15.0)
Immature Granulocytes: 7 %
Lymphocytes Relative: 20 %
Lymphs Abs: 2.8 10*3/uL (ref 0.7–4.0)
MCH: 28.7 pg (ref 26.0–34.0)
MCHC: 31.1 g/dL (ref 30.0–36.0)
MCV: 92.3 fL (ref 80.0–100.0)
Monocytes Absolute: 1 10*3/uL (ref 0.1–1.0)
Monocytes Relative: 7 %
Neutro Abs: 9.3 10*3/uL — ABNORMAL HIGH (ref 1.7–7.7)
Neutrophils Relative %: 65 %
Platelets: 280 10*3/uL (ref 150–400)
RBC: 4.04 MIL/uL (ref 3.87–5.11)
RDW: 14.4 % (ref 11.5–15.5)
Smear Review: NORMAL
WBC: 14.3 10*3/uL — ABNORMAL HIGH (ref 4.0–10.5)
nRBC: 0 % (ref 0.0–0.2)

## 2023-10-24 LAB — COMPREHENSIVE METABOLIC PANEL
ALT: 10 U/L (ref 0–44)
AST: 17 U/L (ref 15–41)
Albumin: 3 g/dL — ABNORMAL LOW (ref 3.5–5.0)
Alkaline Phosphatase: 95 U/L (ref 38–126)
Anion gap: 15 (ref 5–15)
BUN: 8 mg/dL (ref 6–20)
CO2: 16 mmol/L — ABNORMAL LOW (ref 22–32)
Calcium: 9.1 mg/dL (ref 8.9–10.3)
Chloride: 106 mmol/L (ref 98–111)
Creatinine, Ser: 0.54 mg/dL (ref 0.44–1.00)
GFR, Estimated: >60 mL/min (ref >60)
Glucose, Bld: 82 mg/dL (ref 70–99)
Potassium: 4.1 mmol/L (ref 3.5–5.1)
Sodium: 137 mmol/L (ref 135–145)
Total Bilirubin: 0.3 mg/dL (ref 0.0–1.2)
Total Protein: 6.7 g/dL (ref 6.5–8.1)

## 2023-10-24 LAB — URINALYSIS, W/ REFLEX TO CULTURE (INFECTION SUSPECTED)
Bilirubin Urine: NEGATIVE
Glucose, UA: NEGATIVE mg/dL
Hgb urine dipstick: NEGATIVE
Ketones, ur: NEGATIVE mg/dL
Nitrite: NEGATIVE
Protein, ur: NEGATIVE mg/dL
Specific Gravity, Urine: 1.013 (ref 1.005–1.030)
pH: 6 (ref 5.0–8.0)

## 2023-10-24 LAB — MAGNESIUM: Magnesium: 1.9 mg/dL (ref 1.7–2.4)

## 2023-10-24 LAB — RESP PANEL BY RT-PCR (RSV, FLU A&B, COVID)  RVPGX2
Influenza A by PCR: NEGATIVE
Influenza B by PCR: NEGATIVE
Resp Syncytial Virus by PCR: NEGATIVE
SARS Coronavirus 2 by RT PCR: NEGATIVE

## 2023-10-24 LAB — TROPONIN I (HIGH SENSITIVITY)
Troponin I (High Sensitivity): 4 ng/L (ref <18)
Troponin I (High Sensitivity): 4 ng/L (ref <18)

## 2023-10-24 LAB — LACTATE DEHYDROGENASE: LDH: 158 U/L (ref 98–192)

## 2023-10-24 LAB — LIPASE, BLOOD: Lipase: 30 U/L (ref 11–51)

## 2023-10-24 LAB — D-DIMER, QUANTITATIVE: D-Dimer, Quant: 1.5 ug{FEU}/mL — ABNORMAL HIGH (ref 0.00–0.50)

## 2023-10-24 MED ORDER — IOHEXOL 350 MG/ML SOLN
75.0000 mL | Freq: Once | INTRAVENOUS | Status: AC | PRN
  Administered 2023-10-24: 75 mL via INTRAVENOUS

## 2023-10-24 NOTE — ED Triage Notes (Signed)
 Patient states she threw up this morning and started having some chest pain. States when she coughed she had some blood clots in sputum. Patient does not take any blood thinners, but takes a baby ASA. Patient denies feeling sick. Patient [redacted] weeks pregnant, G1. Patient sees Dr Valentino Saxon as her OBGYN. BP is 153/101 in triage.

## 2023-10-24 NOTE — Discharge Instructions (Signed)
 Your tests today in the Emergency Department were all reassuring. Please follow up with the Obstetrics clinic within the next week for blood pressure recheck.

## 2023-10-24 NOTE — ED Triage Notes (Signed)
 First nurse note: pt to ED [facility] from work for n/v this am, chest pain, "coughing up blood clots" this am. [redacted] weeks pregnant. Sees [facility]. Takes 324 ASA daily. Bp 140/94

## 2023-10-24 NOTE — ED Provider Notes (Signed)
 [facility] Provider Note    Event Date/Time   First MD Initiated Contact with Patient 10/24/23 1007     (approximate)   History   Chief Complaint: Chest Pain   HPI  Sandra Marsh is a 28 y.o. female with a past history of PCOS, currently at [redacted] weeks pregnant who comes ED complaining of central chest pain starting this morning, associated with coughing up clots of blood.  Hurts to breathe.  No fever.  No dysuria vaginal bleeding leakage of fluid or contractions.  Normal fetal movements.  Has bilateral lower extremity swelling, no leg pain.          Physical Exam   Triage Vital Signs: ED Triage Vitals  Encounter Vitals Group     BP 10/24/23 1000 (!) 153/101     Systolic BP Percentile --      Diastolic BP Percentile --      Pulse Rate 10/24/23 1000 92     Resp 10/24/23 1000 19     Temp 10/24/23 1000 98.7 F (37.1 C)     Temp Source 10/24/23 1000 Oral     SpO2 10/24/23 1000 100 %     Weight 10/24/23 1001 232 lb (105.2 kg)     Height 10/24/23 1001 5' (1.524 m)     Head Circumference --      Peak Flow --      Pain Score 10/24/23 1001 1     Pain Loc --      Pain Education --      Exclude from Growth Chart --     Most recent vital signs: Vitals:   10/24/23 1030 10/24/23 1137  BP: 91/64 101/89  Pulse:    Resp:    Temp:    SpO2:      General: Awake, no distress.  CV:  Good peripheral perfusion.  Regular rate rhythm Resp:  Normal effort.  Clear to auscultation bilaterally Abd:  No distention.  Gravid.  Soft, nontender Other:  Symmetric calf circumference.  Lymphedema bilateral lower extremities, no pitting edema.   ED Results / Procedures / Treatments   Labs (all labs ordered are listed, but only abnormal results are displayed) Labs Reviewed  COMPREHENSIVE METABOLIC PANEL - Abnormal; Notable for the following components:      Result Value   CO2 16 (*)    Albumin 3.0 (*)    All other components within normal limits  CBC WITH  DIFFERENTIAL/PLATELET - Abnormal; Notable for the following components:   WBC 14.3 (*)    Hemoglobin 11.6 (*)    Neutro Abs 9.3 (*)    Abs Immature Granulocytes 1.04 (*)    All other components within normal limits  URINALYSIS, W/ REFLEX TO CULTURE (INFECTION SUSPECTED) - Abnormal; Notable for the following components:   Color, Urine YELLOW (*)    APPearance HAZY (*)    Leukocytes,Ua SMALL (*)    Bacteria, UA FEW (*)    All other components within normal limits  D-DIMER, QUANTITATIVE - Abnormal; Notable for the following components:   D-Dimer, Quant 1.50 (*)    All other components within normal limits  RESP PANEL BY RT-PCR (RSV, FLU A&B, COVID)  RVPGX2  LIPASE, BLOOD  LACTATE DEHYDROGENASE  MAGNESIUM  URIC ACID  TROPONIN I (HIGH SENSITIVITY)  TROPONIN I (HIGH SENSITIVITY)     EKG Interpreted by me Sinus tachycardia rate 105.  Normal axis intervals QRS ST segments and T waves.   RADIOLOGY Chest x-ray interpreted  by me, appears normal.  Radiology report reviewed.  CT angiogram chest negative for PE or other acute findings.   PROCEDURES:  Procedures   MEDICATIONS ORDERED IN ED: Medications  iohexol (OMNIPAQUE) 350 MG/ML injection 75 mL (75 mLs Intravenous Contrast Given 10/24/23 1339)     IMPRESSION / MDM / ASSESSMENT AND PLAN / ED COURSE  I reviewed the triage vital signs and the nursing notes.  DDx: Pneumothorax, pneumonia, COVID, influenza, electrolyte derangement, AKI, pulmonary embolism, DVT, functional pain, GERD  Patient's presentation is most consistent with acute presentation with potential threat to life or bodily function.     Clinical Course as of 10/24/23 1437  Thu Oct 24, 2023  1016 Patient presents with central chest pain starting about 2 hours ago associated with 1 episode of vomiting and hemoptysis.  Will obtain labs, chest x-ray, lower extremity ultrasounds to evaluate for VTE versus preeclampsia, pneumothorax, pneumonia, infection.  Recheck  of blood pressure is 90/70 currently [PS]  1303 BLE [place] negative. Labs reassuring except d dimer 1.5. by years algorithm, will proceed with chest imaging. Discussed risks/benefits of CTPA vs NM Q scan, pt agrees with CTPA. No sig. FH of breast ca. [PS]    Clinical Course User Index [PS] Sharman Cheek, MD     ----------------------------------------- 2:40 PM on 10/24/2023 ----------------------------------------- CT negative for PE.  Subsequent blood pressure measurements have been normal, no repeated hypertensive measurements to indicate preeclampsia.  Lab panel is normal except for elevated D-dimer.  Care discussed with OB midwife who will help facilitate outpatient follow-up in the clinic.  They request the patient make sure they are seen within the next week for blood pressure recheck.  This was relayed to the patient.  FINAL CLINICAL IMPRESSION(S) / ED DIAGNOSES   Final diagnoses:  Nonspecific chest pain  Third trimester pregnancy     Rx / DC Orders   ED Discharge Orders     None        Note:  This document was prepared using Dragon voice recognition software and may include unintentional dictation errors.   Sharman Cheek, MD 10/24/23 [phone]

## 2023-10-24 NOTE — Telephone Encounter (Signed)
 Please let her know that I'll review her chart. D-dimer levels are often elevated in pregnancy. We will continue to monitor her blood pressures closely. Was she given anything for her vomiting/chest pain? Does she still have these symptoms?

## 2023-10-24 NOTE — Telephone Encounter (Signed)
 TRIAGE VOICEMAIL: Patient advised she was seen in ED this morning for vomiting, chest pain and coughed up clot of blood. They did numerous test and her BP was elevated initially, but returned to normal. They did a CT of lungs which was clear, but her D Dimer levels were still pretty high. She was advised to let us know.

## 2023-10-25 NOTE — Telephone Encounter (Signed)
 Patient aware. She states she was not given anything for vomity/chest pain and she is no longer having any of the symptoms.

## 2023-10-28 ENCOUNTER — Ambulatory Visit: Payer: BC Managed Care – PPO | Attending: Obstetrics

## 2023-10-28 ENCOUNTER — Encounter: Payer: Self-pay | Admitting: *Deleted

## 2023-10-28 ENCOUNTER — Ambulatory Visit: Payer: BC Managed Care – PPO | Admitting: *Deleted

## 2023-10-28 ENCOUNTER — Ambulatory Visit (HOSPITAL_BASED_OUTPATIENT_CLINIC_OR_DEPARTMENT_OTHER): Payer: BC Managed Care – PPO | Admitting: Maternal & Fetal Medicine

## 2023-10-28 ENCOUNTER — Other Ambulatory Visit: Payer: Self-pay

## 2023-10-28 VITALS — BP 123/77 | HR 106

## 2023-10-28 DIAGNOSIS — O4593 Premature separation of placenta, unspecified, third trimester: Secondary | ICD-10-CM

## 2023-10-28 DIAGNOSIS — O99213 Obesity complicating pregnancy, third trimester: Secondary | ICD-10-CM

## 2023-10-28 DIAGNOSIS — Z3A35 35 weeks gestation of pregnancy: Secondary | ICD-10-CM | POA: Diagnosis not present

## 2023-10-28 DIAGNOSIS — E669 Obesity, unspecified: Secondary | ICD-10-CM

## 2023-10-28 DIAGNOSIS — O43193 Other malformation of placenta, third trimester: Secondary | ICD-10-CM

## 2023-10-28 DIAGNOSIS — O365990 Maternal care for other known or suspected poor fetal growth, unspecified trimester, not applicable or unspecified: Secondary | ICD-10-CM | POA: Insufficient documentation

## 2023-10-28 DIAGNOSIS — O09899 Supervision of other high risk pregnancies, unspecified trimester: Secondary | ICD-10-CM | POA: Diagnosis not present

## 2023-10-28 DIAGNOSIS — O365930 Maternal care for other known or suspected poor fetal growth, third trimester, not applicable or unspecified: Secondary | ICD-10-CM | POA: Diagnosis not present

## 2023-10-28 DIAGNOSIS — O43192 Other malformation of placenta, second trimester: Secondary | ICD-10-CM | POA: Insufficient documentation

## 2023-10-28 DIAGNOSIS — F32A Depression, unspecified: Secondary | ICD-10-CM

## 2023-10-28 DIAGNOSIS — O358XX0 Maternal care for other (suspected) fetal abnormality and damage, not applicable or unspecified: Secondary | ICD-10-CM

## 2023-10-28 DIAGNOSIS — F419 Anxiety disorder, unspecified: Secondary | ICD-10-CM | POA: Diagnosis not present

## 2023-10-28 NOTE — Progress Notes (Signed)
 MFM Consultation  [name] is a 28 yo G1P0 at 27 w4 d with an EDDD of 11/30/23.   She is seen at the request of Carie Caddy CNM  She is seen for concerns of fetal growth restriction, marginal cord insertion and single umbilical artery.  She is overall doing well without complaints. She is had a low risk NIPT.   On an outside exam they reported a growth of:  09/10/23: 13% with AC at the 17th%.  10/11/23:  9th% and AC 14th % UAD and AFI were normal. The BPD 8.7%, HC 20.6%  and FL 3.6%     10/28/2023   10:00 AM 10/24/2023    2:44 PM 10/24/2023   11:37 AM  Vitals with BMI  Systolic 123 105 161  Diastolic 77 76 89  Pulse 106 87     Imaging:  Single intrauterine pregnancy with measurements consistent with dates. Good fetal movement and amniotic fluid observed. Suboptimal views of the fetal anatomy were obtained secondary fetal position, and advanced gestational age   Impression/counseling:  Fetal growth restriction:  I discussed with Sandra Marsh diagnosis, evaluation and management of fetal growth restriction. We discussed that the diagnosis is of FGR is based on an EFW and or AC < 10 th%.  Evaluation includes assessments of the amniotic fluid index, umbilical artery dopplers and fetal testing either biophysical profile and or non-stress test. These test were performed and noted to be normal today. However, of note her prior exams at her providers offices were similar in trend.  I discussed the etiology of FGR includes constitutional, placental insufficiency, genetic, infection, placental, umbilical cord defects and chronic disease. I suggest that this is likely constitutional given that the mother is of small stature and that her screening/test have been normal.  In addition, we discussed the role of a marginal cord insertion and isolated single umbilical artery can play in fetal growth delays. I discussed that both findings have been associated with growth delays.  At this time  I recommend continue weekly testing until delivery.  I recommend delivery between 39-40 weeks given good fetal testing.  I spent 60 minutes with > 50% in face to face consultation.  Novella Olive, MD

## 2023-10-30 DIAGNOSIS — F603 Borderline personality disorder: Secondary | ICD-10-CM | POA: Diagnosis not present

## 2023-11-01 ENCOUNTER — Encounter: Payer: BC Managed Care – PPO | Admitting: Certified Nurse Midwife

## 2023-11-04 ENCOUNTER — Encounter
Admission: RE | Admit: 2023-11-04 | Discharge: 2023-11-04 | Disposition: A | Discharge status: Home / Self Care | Payer: BC Managed Care – PPO | Source: Ambulatory Visit | Attending: Anesthesiology | Admitting: Anesthesiology

## 2023-11-04 NOTE — Progress Notes (Signed)
 [facility] Anesthesia Consultation  AMEYA VOWELL WGN:[id number] DOB: 06-Aug-1996 DOA: 11/04/2023 PCP: [facility]   Requesting physician:  Date of consultation: 3 3 25  Reason for consultation: Obesity during pregnancy  CHIEF COMPLAINT:  Obesity during pregnancy  HISTORY OF PRESENT ILLNESS: Danely Bayliss  is a 28 y.o. female with a known history of obesity  PAST MEDICAL HISTORY:   Past Medical History:  Diagnosis Date   Anxiety 07/09/2018   MDD (major depressive disorder) 09/07/2019   Ovarian cyst    PCOS (polycystic ovarian syndrome)     PAST SURGICAL HISTORY:  Past Surgical History:  Procedure Laterality Date   INTRAUTERINE DEVICE (IUD) INSERTION N/A 02/06/2021   Procedure: INTRAUTERINE DEVICE (IUD) INSERTION;  Surgeon: Hildred Laser, MD;  Location: [facility] ORS;  Service: Gynecology;  Laterality: N/A;   IUD REMOVAL N/A 03/12/2022   Procedure: INTRAUTERINE DEVICE (IUD) REMOVAL;  Surgeon: Hildred Laser, MD;  Location: [facility] ORS;  Service: Gynecology;  Laterality: N/A;   LAPAROSCOPIC OVARIAN CYSTECTOMY Right 02/06/2021   Procedure: LAPAROSCOPIC OVARIAN CYSTECTOMY;  Surgeon: Hildred Laser, MD;  Location: [facility] ORS;  Service: Gynecology;  Laterality: Right;   WISDOM TOOTH EXTRACTION     four; age 34    SOCIAL HISTORY:  Social History   Tobacco Use   Smoking status: Former    Types: Cigarettes   Smokeless tobacco: Never  Substance Use Topics   Alcohol use: Never    FAMILY HISTORY:  Family History  Problem Relation Age of Onset   Ovarian cancer Mother 63   Healthy Father    Healthy Sister    Healthy Sister    Heart Problems Maternal Grandmother    COPD Maternal Grandmother    Healthy Maternal Grandfather    Hypertension Paternal Grandmother    Healthy Paternal Grandfather     DRUG ALLERGIES:  Allergies  Allergen Reactions   Nickel Rash    REVIEW OF SYSTEMS:   RESPIRATORY: No cough, shortness of breath,  wheezing.  CARDIOVASCULAR: No chest pain, orthopnea, edema.  HEMATOLOGY: No anemia, easy bruising or bleeding SKIN: No rash or lesion. NEUROLOGIC: No tingling, numbness, weakness.  PSYCHIATRY: No anxiety or depression.   MEDICATIONS AT HOME:  Prior to Admission medications   Medication Sig Start Date End Date Taking? Authorizing Provider  aspirin EC 81 MG tablet Take 1 tablet (81 mg total) by mouth daily. Take after 12 weeks for prevention of preeclampssia later in pregnancy 05/21/23   Hildred Laser, MD  lamoTRIgine (LAMICTAL) 200 MG tablet Take 200 mg by mouth in the morning. 11/30/19   [provider]  lamoTRIgine (LAMICTAL) 25 MG tablet Take 50 mg by mouth at bedtime. 04/18/21   [provider]  Prenatal Vit-Fe Fumarate-FA (MULTIVITAMIN-PRENATAL) 27-0.8 MG TABS tablet Take 1 tablet by mouth daily at 12 noon.    [provider]      PHYSICAL EXAMINATION:   VITAL SIGNS: Height 5\' 1"  (1.549 m), weight 106 kg, last menstrual period 02/23/2023.  GENERAL:  28 y.o.-year-old patient no acute distress.  HEENT: Head atraumatic, normocephalic. Oropharynx and nasopharynx clear. MP 3, TM distance >3 cm, normal mouth opening. LUNGS: No use of accessory muscles of respiration.   EXTREMITIES: No pedal edema, cyanosis, or clubbing.  NEUROLOGIC: normal gait PSYCHIATRIC: The patient is alert and oriented x 3.  SKIN: No obvious rash, lesion, or ulcer.    IMPRESSION AND PLAN:   Gladyes Kudo  is a 28 y.o. female presenting with obesity during pregnancy. BMI is currently  44 at [redacted] weeks gestation.   We discussed analgesic options during labor including epidural analgesia. Discussed that in obesity there can be increased difficulty with epidural placement or even failure of successful epidural. We also discussed that even after successful epidural placement there is increased risk of catheter migration out of the epidural space that would require catheter replacement. Discussed  use of epidural vs spinal vs GA if cesarean delivery is required. Discussed increased risk of difficult intubation during pregnancy should an emergency cesarean delivery be required.   We discussed repeat evaluation at 35-36 weeks by anesthesia to determine whether there is a high risk of complications of anesthesia for which we would recommend transfer of OB care to a facility with a higher maternal level of care designation.

## 2023-11-05 ENCOUNTER — Encounter: Payer: BC Managed Care – PPO | Admitting: Obstetrics

## 2023-11-05 ENCOUNTER — Telehealth: Payer: Self-pay | Admitting: Obstetrics

## 2023-11-05 DIAGNOSIS — F902 Attention-deficit hyperactivity disorder, combined type: Secondary | ICD-10-CM | POA: Diagnosis not present

## 2023-11-05 DIAGNOSIS — F1211 Cannabis abuse, in remission: Secondary | ICD-10-CM | POA: Diagnosis not present

## 2023-11-05 DIAGNOSIS — F411 Generalized anxiety disorder: Secondary | ICD-10-CM | POA: Diagnosis not present

## 2023-11-05 DIAGNOSIS — F39 Unspecified mood [affective] disorder: Secondary | ICD-10-CM | POA: Diagnosis not present

## 2023-11-05 NOTE — Telephone Encounter (Signed)
 Reached out to pt to reschedule 2 week ROB appt that was scheduled on 11/05/2023 at 1:55 with Dr. Lonny Prude. Left message for pt to call back.

## 2023-11-05 NOTE — Progress Notes (Deleted)
    Return Prenatal Note   Subjective  28 y.o. G1P0000 at [redacted]w[redacted]d presents for this follow-up prenatal visit.  Patient ***  Patient reports:   Denies vaginal bleeding or leaking fluid. Objective  Flow sheet Vitals:   Total weight gain: 82 lb (37.2 kg)  General Appearance  No acute distress, well appearing, and well nourished Pulmonary   Normal work of breathing Neurologic   Alert and oriented to person, place, and time Psychiatric   Mood and affect within normal limits  Assessment/Plan   Plan  28 y.o. G1P0000 at [redacted]w[redacted]d presents for follow-up OB visit. Reviewed prenatal record including previous visit note. 1. Routine screening for STI (sexually transmitted infection)  2. Antenatal screening for streptococcus B  3. Supervision of high risk pregnancy in third trimester (Primary)   No problem-specific Assessment & Plan notes found for this encounter.    No orders of the defined types were placed in this encounter.  No follow-ups on file.   Future Appointments  Date Time Provider Department Center  11/05/2023  1:55 PM Julieanne Manson, MD [facility] None    For next visit:  {SJFprenatalcare:29716}      Julieanne Manson, DO [facility] [place]

## 2023-11-06 ENCOUNTER — Other Ambulatory Visit (HOSPITAL_COMMUNITY)
Admission: RE | Admit: 2023-11-06 | Discharge: 2023-11-06 | Disposition: A | Discharge status: Home / Self Care | Source: Ambulatory Visit | Attending: Advanced Practice Midwife | Admitting: Advanced Practice Midwife

## 2023-11-06 ENCOUNTER — Encounter: Payer: Self-pay | Admitting: Advanced Practice Midwife

## 2023-11-06 ENCOUNTER — Ambulatory Visit (INDEPENDENT_AMBULATORY_CARE_PROVIDER_SITE_OTHER): Admitting: Advanced Practice Midwife

## 2023-11-06 VITALS — BP 120/78 | Wt 238.0 lb

## 2023-11-06 DIAGNOSIS — Z113 Encounter for screening for infections with a predominantly sexual mode of transmission: Secondary | ICD-10-CM | POA: Insufficient documentation

## 2023-11-06 DIAGNOSIS — O365990 Maternal care for other known or suspected poor fetal growth, unspecified trimester, not applicable or unspecified: Secondary | ICD-10-CM

## 2023-11-06 DIAGNOSIS — Z112 Encounter for screening for other bacterial diseases: Secondary | ICD-10-CM | POA: Diagnosis not present

## 2023-11-06 DIAGNOSIS — O09899 Supervision of other high risk pregnancies, unspecified trimester: Secondary | ICD-10-CM

## 2023-11-06 DIAGNOSIS — Z3A36 36 weeks gestation of pregnancy: Secondary | ICD-10-CM

## 2023-11-06 DIAGNOSIS — O99213 Obesity complicating pregnancy, third trimester: Secondary | ICD-10-CM

## 2023-11-06 DIAGNOSIS — O0993 Supervision of high risk pregnancy, unspecified, third trimester: Secondary | ICD-10-CM | POA: Insufficient documentation

## 2023-11-06 LAB — POCT URINALYSIS DIPSTICK OB
Bilirubin, UA: NEGATIVE
Blood, UA: NEGATIVE
Glucose, UA: NEGATIVE
Ketones, UA: NEGATIVE
Leukocytes, UA: NEGATIVE
Nitrite, UA: NEGATIVE
POC,PROTEIN,UA: NEGATIVE
Spec Grav, UA: 1.01 (ref 1.010–1.025)
Urobilinogen, UA: 0.2 U/dL
pH, UA: 7 (ref 5.0–8.0)

## 2023-11-06 NOTE — Progress Notes (Signed)
 Routine Prenatal Care Visit  Subjective  Sandra Marsh is a 28 y.o. G1P0000 at [redacted]w[redacted]d being seen today for ongoing prenatal care.  She is currently monitored for the following issues for this high-risk pregnancy and has Anxiety and depression; MDD (major depressive disorder), recurrent severe, without psychosis (HCC); Supervision of normal pregnancy; Single umbilical artery affecting management of mother in singleton pregnancy, antepartum; Marginal insertion of umbilical cord affecting management of mother in second trimester; Poor clinical fetal growth in third trimester; Pelvic pain affecting pregnancy in third trimester, antepartum; Sleep disturbance; Obesity in pregnancy, antepartum, third trimester; and IUGR (intrauterine growth restriction) affecting care of mother on their problem list.  ----------------------------------------------------------------------------------- Patient reports irregular crampy contractions in the past week.  Reviewed MFM recommendations regarding weekly antenatal testing and delivery between 39-40 wks. Contractions: Not present. Vag. Bleeding: None.  Movement: Present. Leaking Fluid denies.  ----------------------------------------------------------------------------------- The following portions of the patient's history were reviewed and updated as appropriate: allergies, current medications, past family history, past medical history, past social history, past surgical history and problem list. Problem list updated.  Objective  Blood pressure 120/78, weight 238 lb (108 kg), last menstrual period 02/23/2023. Pregravid weight 158 lb (71.7 kg) Total Weight Gain 80 lb (36.3 kg) Urinalysis: Urine Protein    Urine Glucose    Fetal Status: Fetal Heart Rate (bpm): 139   Movement: Present     General:  Alert, oriented and cooperative. Patient is in no acute distress.  Skin: Skin is warm and dry. No rash noted.   Cardiovascular: Normal heart rate noted  Respiratory:  Normal respiratory effort, no problems with respiration noted  Abdomen: Soft, gravid, appropriate for gestational age. Pain/Pressure: Absent     Pelvic:  Cervical exam performed Dilation: Fingertip Effacement (%): 50 Station: -3, GBS/aptima  Extremities: Normal range of motion.  Edema: None  Mental Status: Normal mood and affect. Normal behavior. Normal judgment and thought content.   Assessment   28 y.o. G1P0000 at [redacted]w[redacted]d by  11/30/2023, by Last Menstrual Period presenting for routine prenatal visit  Plan   August 2024 Problems (from 04/22/23 to present)     Problem Noted Diagnosed Resolved   Marginal insertion of umbilical cord affecting management of mother in second trimester 08/13/2023 by Dominica Severin, CNM  No   Supervision of normal pregnancy 04/22/2023 by Loran Senters, CMA  No   Overview Addendum 09/23/2023  4:38 PM by Tommie Raymond, CMA   Clinical Staff Provider  Office Location  [facility] Dating  By LMP  Language  English Anatomy US  SUA, marginal cord insertion  Flu Vaccine  06/18/2023 Genetic Screen  NIPS: Panorama low risk, female   TDaP vaccine  09/10/23 Hgb A1C or  GTT Early : Third trimester : 107  Covid UTD   LAB RESULTS   Rhogam  O/Positive/-- (09/17 1600)  Blood Type O/Positive/-- (09/17 1600)   Feeding Plan breast Antibody Negative (09/17 1600)  Contraception pill Rubella 1.33 (09/17 1600)  Circumcision yes RPR Non Reactive (09/17 1600)   Pediatrician  undecided HBsAg Negative (09/17 1600)   Support Person Fayrene Fearing HIV Non Reactive (09/17 1600)  Prenatal Classes yes Varicella 1,092 (09/17 1600)    GBS  (For PCN allergy, check sensitivities)   BTL Consent  Hep C Non Reactive (09/17 1600)   VBAC Consent  Pap Diagnosis  Date Value Ref Range Status  05/21/2023   Final   - Negative for intraepithelial lesion or malignancy (NILM)  Hgb Electro      CF      SMA                    Preterm labor symptoms and general obstetric precautions  including but not limited to vaginal bleeding, contractions, leaking of fluid and fetal movement were reviewed in detail with the patient. Please refer to After Visit Summary for other counseling recommendations.   Return in about 1 week (around 11/13/2023) for bpp/nst/rob.  Tresea Mall, CNM 11/06/2023 3:28 PM

## 2023-11-06 NOTE — Addendum Note (Signed)
 Addended by: Donnetta Hail on: 11/06/2023 03:43 PM   Modules accepted: Orders

## 2023-11-06 NOTE — Patient Instructions (Signed)
 Ultrasound and Heart Rate Checks of Your Unborn Baby (BPP Test): What to Expect A biophysical profile, also called a BPP, is a test that checks your baby's heart rate and other areas related to your baby's health. In this test, a belt with a sensor is used to check your baby's heart rate. And an ultrasound is used to check the baby's breathing, heart rate, movement, and other areas. This test may be done if your pregnancy is at a higher risk or if tests show that your baby may be at risk for certain problems. It is often done during the last 3 months of pregnancy. Tell a health care provider about: Any concerns you have about your pregnancy. These may include: Contractions and pain in the belly. Bleeding from the vagina. Leaking of amniotic fluid. This is fluid that surrounds your baby. Decreased movements of your baby. Fever or infection. How often you feel your baby move. What are the risks? There are no risks to you or your baby from this test. What happens before the procedure? Ask your health care provider how to prepare for this test. You may be asked to: Drink fluids so that you have a full bladder for your ultrasound. Eat before you arrive for the test. This makes your baby more active. What happens during the procedure?     You will lie on your back on an exam table. A belt with a sensor will be placed around your belly to check your baby's heart rate. You may wear another belt and sensor to check if you are having contractions. During the ultrasound, a provider will put a small amount of gel on your belly and gently roll a device, called a transducer, over it. This device makes pictures of your baby on a screen. In the pictures, the provider can check the following areas: Heart rate. Breathing. Movement. Active muscle movement (muscle tone). The amount of fluid around your baby. The procedure may vary among providers and hospitals. What can I expect after the  procedure? Your provider will discuss your results with you. The results of a BPP are scored in a range of 0 to 10. The scoring is as follows: Each of the five areas that are checked is given a score of 0 or 2 points. A total score of 8 to 10 with normal fluid levels is considered normal. A total score of 6 or less may require more testing or immediate delivery. Unless you need more testing, you can go home after the BPP and do your normal activities. Where to find more information Office on Women's Health: TravelLesson.ca [employer]st[employer]ians [employer]necologists: acog.org This information is not intended to replace advice given to you by your health care provider. Make sure you discuss any questions you have with your health care provider. Document Revised: 02/06/2023 Document Reviewed: 02/06/2023 Elsevier Patient Education  2024 Elsevier Inc.Nonstress Test A nonstress test is a procedure that is done during pregnancy in order to check the baby's heartbeat. The procedure can help to show if the baby (fetus) is healthy. It is commonly done when: The baby is past his or her due date. The pregnancy is high risk. The baby is moving less than normal. The mother has lost a pregnancy in the past. The health care provider suspects a problem with the baby's growth. There is too much or too little amniotic fluid. The procedure is often done in the third trimester of pregnancy to find out if an  early delivery is needed and whether such a delivery is safe. During a nonstress test, the baby's heartbeat is monitored when the baby is resting and when the baby is moving. If the baby is healthy, the heart rate will increase when he or she moves or kicks and will return to normal when he or she rests. Tell a health care provider about: Any allergies you have. Any medical conditions you have. All medicines you are taking, including vitamins, herbs, eye drops, creams, and over-the-counter  medicines. Any surgeries you have had. Any past pregnancies you have had. What are the risks? There are no risks to you or your baby from a nonstress test. This procedure should not be painful or uncomfortable. What happens before the procedure? Eat a meal right before the test or as directed by your health care provider. Food may help encourage the baby to move. Use the restroom right before the test. What happens during the procedure?  Two monitors will be placed on your abdomen. One will record the baby's heart rate and the other will record the contractions of your uterus. You may be asked to lie down on your side or to sit upright. You may be given a button to press when you feel your baby move. Your health care provider will listen to your baby's heartbeat and record it. He or she may also watch your baby's heartbeat on a screen. If the baby seems to be sleeping, you may be asked to drink some juice or soda, eat a snack, or change positions. The procedure may vary among health care providers and hospitals. What can I expect after procedure? Your health care provider will discuss the test results with you and make recommendations for the future. Depending on the results, your health care provider may order additional tests or another course of action. If your health care provider gave you any diet or activity instructions, make sure to follow them. Keep all follow-up visits. This is important. Summary A nonstress test is a procedure that is done during pregnancy in order to check the baby's heartbeat. The procedure can help show if the baby is healthy. The procedure is often done in the third trimester of pregnancy to find out if an early delivery is needed and whether such a delivery is safe. During a nonstress test, the baby's heartbeat is monitored when the baby is resting and when the baby is moving. If the baby is healthy, the heart rate will increase when he or she moves or kicks  and will return to normal when he or she rests. Your health care provider will discuss the test results with you and make recommendations for the future. This information is not intended to replace advice given to you by your health care provider. Make sure you discuss any questions you have with your health care provider. Document Revised: 05/03/2021 Document Reviewed: 05/30/2020 Elsevier Patient Education  2024 [employer].

## 2023-11-06 NOTE — Telephone Encounter (Signed)
 Pt has been rescheduled to 11/06/2023 at 2:55 with JEG.

## 2023-11-08 LAB — CERVICOVAGINAL ANCILLARY ONLY
Chlamydia: NEGATIVE
Comment: NEGATIVE
Comment: NORMAL
Neisseria Gonorrhea: NEGATIVE

## 2023-11-08 LAB — STREP GP B NAA: Strep Gp B NAA: NEGATIVE

## 2023-11-13 ENCOUNTER — Other Ambulatory Visit: Payer: Self-pay | Admitting: Obstetrics

## 2023-11-13 ENCOUNTER — Ambulatory Visit (INDEPENDENT_AMBULATORY_CARE_PROVIDER_SITE_OTHER): Admitting: Obstetrics

## 2023-11-13 ENCOUNTER — Ambulatory Visit

## 2023-11-13 ENCOUNTER — Encounter: Payer: Self-pay | Admitting: Obstetrics

## 2023-11-13 VITALS — BP 130/82 | HR 97 | Wt 235.0 lb

## 2023-11-13 DIAGNOSIS — O43192 Other malformation of placenta, second trimester: Secondary | ICD-10-CM

## 2023-11-13 DIAGNOSIS — G479 Sleep disorder, unspecified: Secondary | ICD-10-CM

## 2023-11-13 DIAGNOSIS — Z3A37 37 weeks gestation of pregnancy: Secondary | ICD-10-CM

## 2023-11-13 DIAGNOSIS — F32A Depression, unspecified: Secondary | ICD-10-CM

## 2023-11-13 DIAGNOSIS — O99213 Obesity complicating pregnancy, third trimester: Secondary | ICD-10-CM

## 2023-11-13 DIAGNOSIS — F332 Major depressive disorder, recurrent severe without psychotic features: Secondary | ICD-10-CM

## 2023-11-13 DIAGNOSIS — O365930 Maternal care for other known or suspected poor fetal growth, third trimester, not applicable or unspecified: Secondary | ICD-10-CM

## 2023-11-13 DIAGNOSIS — F603 Borderline personality disorder: Secondary | ICD-10-CM | POA: Diagnosis not present

## 2023-11-13 DIAGNOSIS — Z3490 Encounter for supervision of normal pregnancy, unspecified, unspecified trimester: Secondary | ICD-10-CM

## 2023-11-13 NOTE — Assessment & Plan Note (Signed)
-  BPP 8/8 today -Continue weekly antenatal surveillance

## 2023-11-13 NOTE — Progress Notes (Signed)
    Return Prenatal Note   Assessment/Plan   Plan  28 y.o. G1P0000 at [redacted]w[redacted]d presents for follow-up OB visit. Reviewed prenatal record including previous visit note.  Poor clinical fetal growth in third trimester -BPP 8/8 today -Continue weekly antenatal surveillance  Supervision of normal pregnancy -Reviewed s/s of labor and when to go to the hospital -Discussed IOL routines, what to expect -Will schedule IOL for 39th week at next visit   No orders of the defined types were placed in this encounter.  No follow-ups on file.   Future Appointments  Date Time Provider Department Center  11/20/2023  2:30 PM [facility] [place] [facility] None  11/20/2023  3:35 PM Free, Lindalou Hose, CNM [facility] None    For next visit:  Routine prenatal care    Subjective   Sandra Marsh is ready for baby. Would like to discuss induction. She is having a few BH ctx. She had some vaginal bleeding after a cervical exam last week, but it resolved.  Movement: Present Contractions: Not present  Objective   Flow sheet Vitals: Pulse Rate: 97 BP: 130/82 Fundal Height: 37 cm Fetal Heart Rate (bpm): 152 Total weight gain: 77 lb (34.9 kg)  General Appearance  No acute distress, well appearing, and well nourished Pulmonary   Normal work of breathing Neurologic   Alert and oriented to person, place, and time Psychiatric   Mood and affect within normal limits  Guadlupe Spanish, CNM 11/13/23 5:09 PM

## 2023-11-13 NOTE — Assessment & Plan Note (Signed)
-  Reviewed s/s of labor and when to go to the hospital -Discussed IOL routines, what to expect -Will schedule IOL for 39th week at next visit

## 2023-11-20 ENCOUNTER — Ambulatory Visit (INDEPENDENT_AMBULATORY_CARE_PROVIDER_SITE_OTHER)

## 2023-11-20 ENCOUNTER — Other Ambulatory Visit: Payer: Self-pay | Admitting: Obstetrics

## 2023-11-20 ENCOUNTER — Other Ambulatory Visit

## 2023-11-20 VITALS — BP 121/82 | HR 112 | Wt 235.0 lb

## 2023-11-20 DIAGNOSIS — O365930 Maternal care for other known or suspected poor fetal growth, third trimester, not applicable or unspecified: Secondary | ICD-10-CM

## 2023-11-20 DIAGNOSIS — Z3490 Encounter for supervision of normal pregnancy, unspecified, unspecified trimester: Secondary | ICD-10-CM

## 2023-11-20 DIAGNOSIS — O99213 Obesity complicating pregnancy, third trimester: Secondary | ICD-10-CM

## 2023-11-20 DIAGNOSIS — Z3A38 38 weeks gestation of pregnancy: Secondary | ICD-10-CM

## 2023-11-20 DIAGNOSIS — O26893 Other specified pregnancy related conditions, third trimester: Secondary | ICD-10-CM

## 2023-11-20 DIAGNOSIS — O43192 Other malformation of placenta, second trimester: Secondary | ICD-10-CM

## 2023-11-20 DIAGNOSIS — F332 Major depressive disorder, recurrent severe without psychotic features: Secondary | ICD-10-CM

## 2023-11-20 NOTE — Progress Notes (Signed)
    Return Prenatal Note   Assessment/Plan   Plan  28 y.o. G1P0000 at [redacted]w[redacted]d presents for follow-up OB visit. Reviewed prenatal record including previous visit note.  IUGR (intrauterine growth restriction) affecting care of mother - BPP 8/8, AFI decreased from 11 to 6 in last week.  - Induction scheduled for 3/25 @ 0001. Reviewed induction process and expectations.   Supervision of normal pregnancy - Has completed CBE classes online.  - Discussed pain management options. Would like to try nitrous oxide, open to epidural if needed.  - Reviewed labor warning signs and expectations for birth. Instructed to call office or come to hospital with persistent headache, vision changes, regular contractions, leaking of fluid, decreased fetal movement or vaginal bleeding.   No orders of the defined types were placed in this encounter.  No follow-ups on file.   No future appointments.  For next visit:  IOL on 3/25 @ 0001     Subjective   27 y.o. G1P0000 at [redacted]w[redacted]d presents for this follow-up prenatal visit.  Patient has no concerns. Ready for baby Patient reports: Movement: Present Contractions: Not present  Objective   Flow sheet Vitals: Pulse Rate: (!) 112 BP: 121/82 Fundal Height: 148 cm Presentation: Vertex (by [place]) Total weight gain: 77 lb (34.9 kg)  General Appearance  No acute distress, well appearing, and well nourished Pulmonary   Normal work of breathing Neurologic   Alert and oriented to person, place, and time Psychiatric   Mood and affect within normal limits  Lindalou Hose Kavari Parrillo, CNM  03/19/253:36 PM

## 2023-11-20 NOTE — Assessment & Plan Note (Signed)
-   BPP 8/8, AFI decreased from 11 to 6 in last week.  - Induction scheduled for 3/25 @ 0001. Reviewed induction process and expectations.

## 2023-11-20 NOTE — Assessment & Plan Note (Signed)
-   Has completed CBE classes online.  - Discussed pain management options. Would like to try nitrous oxide, open to epidural if needed.  - Reviewed labor warning signs and expectations for birth. Instructed to call office or come to hospital with persistent headache, vision changes, regular contractions, leaking of fluid, decreased fetal movement or vaginal bleeding.

## 2023-11-25 ENCOUNTER — Other Ambulatory Visit: Payer: Self-pay | Admitting: Certified Nurse Midwife

## 2023-11-26 ENCOUNTER — Other Ambulatory Visit: Payer: Self-pay

## 2023-11-26 ENCOUNTER — Inpatient Hospital Stay
Admission: RE | Admit: 2023-11-26 | Discharge: 2023-11-28 | DRG: 807 | Disposition: A | Discharge status: Home / Self Care | Payer: BC Managed Care – PPO | Attending: Certified Nurse Midwife | Admitting: Certified Nurse Midwife

## 2023-11-26 ENCOUNTER — Inpatient Hospital Stay: Payer: Self-pay | Admitting: Anesthesiology

## 2023-11-26 ENCOUNTER — Encounter: Payer: Self-pay | Admitting: Obstetrics and Gynecology

## 2023-11-26 DIAGNOSIS — Z3490 Encounter for supervision of normal pregnancy, unspecified, unspecified trimester: Secondary | ICD-10-CM | POA: Diagnosis present

## 2023-11-26 DIAGNOSIS — O43193 Other malformation of placenta, third trimester: Secondary | ICD-10-CM | POA: Diagnosis not present

## 2023-11-26 DIAGNOSIS — O99213 Obesity complicating pregnancy, third trimester: Secondary | ICD-10-CM | POA: Diagnosis present

## 2023-11-26 DIAGNOSIS — F419 Anxiety disorder, unspecified: Secondary | ICD-10-CM | POA: Diagnosis not present

## 2023-11-26 DIAGNOSIS — O99214 Obesity complicating childbirth: Secondary | ICD-10-CM | POA: Diagnosis present

## 2023-11-26 DIAGNOSIS — O365930 Maternal care for other known or suspected poor fetal growth, third trimester, not applicable or unspecified: Principal | ICD-10-CM | POA: Diagnosis present

## 2023-11-26 DIAGNOSIS — F32A Depression, unspecified: Secondary | ICD-10-CM | POA: Diagnosis present

## 2023-11-26 DIAGNOSIS — O99344 Other mental disorders complicating childbirth: Secondary | ICD-10-CM | POA: Diagnosis present

## 2023-11-26 DIAGNOSIS — F329 Major depressive disorder, single episode, unspecified: Secondary | ICD-10-CM | POA: Diagnosis not present

## 2023-11-26 DIAGNOSIS — O09899 Supervision of other high risk pregnancies, unspecified trimester: Secondary | ICD-10-CM

## 2023-11-26 DIAGNOSIS — Z8249 Family history of ischemic heart disease and other diseases of the circulatory system: Secondary | ICD-10-CM | POA: Diagnosis not present

## 2023-11-26 DIAGNOSIS — O365990 Maternal care for other known or suspected poor fetal growth, unspecified trimester, not applicable or unspecified: Secondary | ICD-10-CM | POA: Diagnosis present

## 2023-11-26 DIAGNOSIS — O2603 Excessive weight gain in pregnancy, third trimester: Secondary | ICD-10-CM | POA: Diagnosis not present

## 2023-11-26 DIAGNOSIS — Z87891 Personal history of nicotine dependence: Secondary | ICD-10-CM | POA: Diagnosis not present

## 2023-11-26 DIAGNOSIS — Z3A39 39 weeks gestation of pregnancy: Secondary | ICD-10-CM | POA: Diagnosis not present

## 2023-11-26 DIAGNOSIS — E669 Obesity, unspecified: Secondary | ICD-10-CM | POA: Diagnosis not present

## 2023-11-26 DIAGNOSIS — Q270 Congenital absence and hypoplasia of umbilical artery: Secondary | ICD-10-CM | POA: Diagnosis not present

## 2023-11-26 DIAGNOSIS — Z23 Encounter for immunization: Secondary | ICD-10-CM | POA: Diagnosis not present

## 2023-11-26 DIAGNOSIS — O43192 Other malformation of placenta, second trimester: Principal | ICD-10-CM | POA: Diagnosis present

## 2023-11-26 HISTORY — DX: Encounter for supervision of normal pregnancy, unspecified, unspecified trimester: Z34.90

## 2023-11-26 LAB — TYPE AND SCREEN
ABO/RH(D): O POS
Antibody Screen: NEGATIVE

## 2023-11-26 LAB — CBC
HCT: 36.9 % (ref 36.0–46.0)
Hemoglobin: 12.2 g/dL (ref 12.0–15.0)
MCH: 28.6 pg (ref 26.0–34.0)
MCHC: 33.1 g/dL (ref 30.0–36.0)
MCV: 86.6 fL (ref 80.0–100.0)
Platelets: 327 10*3/uL (ref 150–400)
RBC: 4.26 MIL/uL (ref 3.87–5.11)
RDW: 14.2 % (ref 11.5–15.5)
WBC: 11.8 10*3/uL — ABNORMAL HIGH (ref 4.0–10.5)
nRBC: 0 % (ref 0.0–0.2)

## 2023-11-26 LAB — RPR: RPR Ser Ql: NONREACTIVE

## 2023-11-26 MED ORDER — EPHEDRINE 5 MG/ML INJ: 10.0000 mg | INTRAVENOUS | Status: DC | PRN

## 2023-11-26 MED ORDER — OXYTOCIN-SODIUM CHLORIDE 30-0.9 UT/500ML-% IV SOLN
2.5000 [IU]/h | INTRAVENOUS | Status: DC
  Filled 2023-11-26: qty 500

## 2023-11-26 MED ORDER — OXYTOCIN 10 UNIT/ML IJ SOLN
INTRAMUSCULAR | Status: AC
  Filled 2023-11-26: qty 2

## 2023-11-26 MED ORDER — OXYTOCIN-SODIUM CHLORIDE 30-0.9 UT/500ML-% IV SOLN
1.0000 m[IU]/min | INTRAVENOUS | Status: DC
  Administered 2023-11-26: 2 m[IU]/min via INTRAVENOUS
  Filled 2023-11-26: qty 500

## 2023-11-26 MED ORDER — MISOPROSTOL 25 MCG QUARTER TABLET
25.0000 ug | ORAL_TABLET | Freq: Once | ORAL | Status: AC
  Administered 2023-11-26: 25 ug via ORAL
  Filled 2023-11-26: qty 1

## 2023-11-26 MED ORDER — DIPHENHYDRAMINE HCL 50 MG/ML IJ SOLN: 12.5000 mg | INTRAMUSCULAR | Status: DC | PRN

## 2023-11-26 MED ORDER — MISOPROSTOL 50MCG HALF TABLET: 50.0000 ug | ORAL_TABLET | Freq: Once | ORAL | Status: DC

## 2023-11-26 MED ORDER — LACTATED RINGERS IV SOLN
500.0000 mL | Freq: Once | INTRAVENOUS | Status: AC
  Administered 2023-11-27: 500 mL via INTRAVENOUS

## 2023-11-26 MED ORDER — LIDOCAINE HCL (PF) 1 % IJ SOLN
INTRAMUSCULAR | Status: AC
  Filled 2023-11-26: qty 30

## 2023-11-26 MED ORDER — FENTANYL-BUPIVACAINE-NACL 0.5-0.125-0.9 MG/250ML-% EP SOLN
12.0000 mL/h | EPIDURAL | Status: DC | PRN
  Administered 2023-11-27: 10 mL/h via EPIDURAL

## 2023-11-26 MED ORDER — PHENYLEPHRINE 80 MCG/ML (10ML) SYRINGE FOR IV PUSH (FOR BLOOD PRESSURE SUPPORT): 80.0000 ug | PREFILLED_SYRINGE | INTRAVENOUS | Status: DC | PRN

## 2023-11-26 MED ORDER — LACTATED RINGERS IV SOLN: INTRAVENOUS | Status: DC

## 2023-11-26 MED ORDER — ONDANSETRON HCL 4 MG/2ML IJ SOLN
4.0000 mg | Freq: Four times a day (QID) | INTRAMUSCULAR | Status: DC | PRN
  Administered 2023-11-27: 4 mg via INTRAVENOUS
  Filled 2023-11-26: qty 2

## 2023-11-26 MED ORDER — LACTATED RINGERS IV SOLN: 500.0000 mL | INTRAVENOUS | Status: DC | PRN

## 2023-11-26 MED ORDER — OXYCODONE-ACETAMINOPHEN 5-325 MG PO TABS: 1.0000 | ORAL_TABLET | ORAL | Status: DC | PRN

## 2023-11-26 MED ORDER — ACETAMINOPHEN 325 MG PO TABS: 650.0000 mg | ORAL_TABLET | ORAL | Status: DC | PRN

## 2023-11-26 MED ORDER — MISOPROSTOL 50MCG HALF TABLET
50.0000 ug | ORAL_TABLET | Freq: Once | ORAL | Status: AC
  Administered 2023-11-26: 50 ug via VAGINAL
  Filled 2023-11-26: qty 1

## 2023-11-26 MED ORDER — OXYTOCIN 10 UNIT/ML IJ SOLN: 10.0000 [IU] | Freq: Once | INTRAMUSCULAR | Status: DC

## 2023-11-26 MED ORDER — AMMONIA AROMATIC IN INHA
RESPIRATORY_TRACT | Status: AC
  Filled 2023-11-26: qty 10

## 2023-11-26 MED ORDER — OXYCODONE-ACETAMINOPHEN 5-325 MG PO TABS: 2.0000 | ORAL_TABLET | ORAL | Status: DC | PRN

## 2023-11-26 MED ORDER — LIDOCAINE HCL (PF) 1 % IJ SOLN: 30.0000 mL | INTRAMUSCULAR | Status: DC | PRN

## 2023-11-26 MED ORDER — MISOPROSTOL 25 MCG QUARTER TABLET
50.0000 ug | ORAL_TABLET | ORAL | Status: DC | PRN
  Administered 2023-11-26 (×2): 50 ug via VAGINAL
  Filled 2023-11-26 (×2): qty 1

## 2023-11-26 MED ORDER — LAMOTRIGINE 100 MG PO TABS
200.0000 mg | ORAL_TABLET | Freq: Every morning | ORAL | Status: DC
  Administered 2023-11-27 – 2023-11-28 (×2): 200 mg via ORAL
  Filled 2023-11-26 (×2): qty 2

## 2023-11-26 MED ORDER — MISOPROSTOL 200 MCG PO TABS
ORAL_TABLET | ORAL | Status: AC
  Filled 2023-11-26: qty 4

## 2023-11-26 MED ORDER — SOD CITRATE-CITRIC ACID 500-334 MG/5ML PO SOLN: 30.0000 mL | ORAL | Status: DC | PRN

## 2023-11-26 MED ORDER — LAMOTRIGINE 25 MG PO TABS
50.0000 mg | ORAL_TABLET | Freq: Every day | ORAL | Status: DC
  Administered 2023-11-27 (×2): 50 mg via ORAL
  Filled 2023-11-26 (×2): qty 2

## 2023-11-26 MED ORDER — OXYTOCIN BOLUS FROM INFUSION
333.0000 mL | Freq: Once | INTRAVENOUS | Status: AC
  Administered 2023-11-27: 333 mL via INTRAVENOUS

## 2023-11-26 MED ORDER — PRENATAL MULTIVITAMIN CH
1.0000 | ORAL_TABLET | Freq: Every day | ORAL | Status: DC
  Administered 2023-11-27: 1 via ORAL
  Filled 2023-11-26: qty 1

## 2023-11-26 NOTE — Progress Notes (Signed)
 Keely A Batie is a 28 y.o. G1P0000 at [redacted]w[redacted]d by LMP admitted for induction of labor due to IUGR.  Subjective: Sandra Marsh is starting to feel contractions. Resting comfortably at this time.   Objective: BP 128/86   Pulse 89   Temp 98 F (36.7 C) (Oral)   Resp 16   Ht 5' (1.524 m)   Wt 106.6 kg   LMP 02/23/2023 (Approximate)   BMI 45.90 kg/m  No intake/output data recorded. No intake/output data recorded.  FHT:  FHR: 135 bpm, variability: moderate,  accelerations:  Present,  decelerations:  Absent UC:   regular, every 3-4 minutes SVE:   Dilation: 4 Effacement (%): 60, 70 Station: -2 Exam by:: [employer], CNM  Labs: Lab Results  Component Value Date   WBC 11.8 (H) 11/26/2023   HGB 12.2 11/26/2023   HCT 36.9 11/26/2023   MCV 86.6 11/26/2023   PLT 327 11/26/2023    Assessment / Plan: Induction of labor due to IUGR.  -Cook balloon out around 1815 -Start pitocin -Will re-evaluate for AROM in 3-4 hrs.  Labor: Progressing normally Preeclampsia:   n/a Fetal Wellbeing:  Category I Pain Control:  Labor support without medications, plan for nitrous.  I/D:  n/a Anticipated MOD:  NSVD  Eloise Levels, Student-MidWife 11/26/2023, 6:36 PM Carie Caddy, CNM present for all portions of care.

## 2023-11-26 NOTE — Progress Notes (Signed)
 Sandra Marsh is a 28 y.o. G1P0000 at [redacted]w[redacted]d by LMP admitted for induction of labor due to IUGR 15%.  Subjective: Sandra Marsh is resting in bed with her partner at bedside. Feeling like she is coping well with contractions.   Objective: BP (!) 137/90 (BP Location: Left Arm)   Pulse 99   Temp 98.1 F (36.7 C) (Oral)   Resp 16   Ht 5' (1.524 m)   Wt 106.6 kg   LMP 02/23/2023 (Approximate)   BMI 45.90 kg/m  No intake/output data recorded. No intake/output data recorded.  FHT:  FHR: 125 bpm, variability: moderate,  accelerations:  Present,  decelerations:  Absent UC:   regular, every 2-4 minutes SVE:   Dilation: 1.5 Effacement (%): 60 Station: -3 Exam by:: R Sharnette Kitamura, CNM  Labs: Lab Results  Component Value Date   WBC 11.8 (H) 11/26/2023   HGB 12.2 11/26/2023   HCT 36.9 11/26/2023   MCV 86.6 11/26/2023   PLT 327 11/26/2023    Assessment / Plan: Sandra Marsh is a G1P0 at [redacted]w[redacted]d here for Iol.  Cook catheter placed with dose of misoprostol.  Continue CEFM.  Will re-assess for AROM when spontaneous expulsion of Cook occurs and start pitocin when indicated.  Preeclampsia:  n/a Labor: Progressing normally. Fetal Wellbeing:  Category I Pain Control:  Labor support without medications, would like nitrous at some point in labor.  I/D:  n/a Anticipated MOD:  NSVD  Eloise Levels, Student-MidWife 11/26/2023, 2:33 PM Carie Caddy, CNM present for all portions of care.

## 2023-11-26 NOTE — Anesthesia Preprocedure Evaluation (Signed)
 Anesthesia Evaluation  Patient identified by MRN, date of birth, ID band Patient awake    Reviewed: Allergy & Precautions, H&P , NPO status , Patient's Chart, lab work & pertinent test results, reviewed documented beta blocker date and time   Airway Mallampati: II  TM Distance: >3 FB Neck ROM: full    Dental no notable dental hx. (+) Teeth Intact   Pulmonary neg pulmonary ROS, former smoker   Pulmonary exam normal breath sounds clear to auscultation       Cardiovascular Exercise Tolerance: Good negative cardio ROS  Rhythm:regular Rate:Normal     Neuro/Psych negative neurological ROS  negative psych ROS   GI/Hepatic negative GI ROS, Neg liver ROS,,,  Endo/Other  negative endocrine ROSdiabetes    Renal/GU      Musculoskeletal   Abdominal   Peds  Hematology negative hematology ROS (+)   Anesthesia Other Findings   Reproductive/Obstetrics (+) Pregnancy                             Anesthesia Physical Anesthesia Plan  ASA: 2  Anesthesia Plan: Epidural   Post-op Pain Management:    Induction:   PONV Risk Score and Plan:   Airway Management Planned:   Additional Equipment:   Intra-op Plan:   Post-operative Plan:   Informed Consent: I have reviewed the patients History and Physical, chart, labs and discussed the procedure including the risks, benefits and alternatives for the proposed anesthesia with the patient or authorized representative who has indicated his/her understanding and acceptance.       Plan Discussed with:   Anesthesia Plan Comments:        Anesthesia Quick Evaluation

## 2023-11-26 NOTE — H&P (Signed)
 Sandra Marsh is a 28 y.o. female presenting for IOL secondary to IUGR. On 2/7 she had an [place] that showed an EFW of 1766g, 9% with normal dopplers. On 2/24 at MFM the EFW was 2310 grams, 15%, it was recommend to deliver between 39 and 40 weeks.   Sandra Marsh has had early and regular prenatal care. Her pregnancy has been complicated by , MDD, anxiety, marginal cord insertion, single umbilical artery, BMI 45 and excessive weight gain in pregnancy-TWG 77lbs.  OB History     Gravida  1   Para  0   Term  0   Preterm  0   AB  0   Living  0      SAB  0   IAB  0   Ectopic  0   Multiple  0   Live Births  0          Past Medical History:  Diagnosis Date   Anxiety 07/09/2018   Encounter for induction of labor 11/26/2023   MDD (major depressive disorder) 09/07/2019   Ovarian cyst    PCOS (polycystic ovarian syndrome)    Past Surgical History:  Procedure Laterality Date   INTRAUTERINE DEVICE (IUD) INSERTION N/A 02/06/2021   Procedure: INTRAUTERINE DEVICE (IUD) INSERTION;  Surgeon: Hildred Laser, MD;  Location: [facility] OR[facility];  Service: Gynecology;  Laterality: N/A;   IUD REMOVAL N/A 03/12/2022   Procedure: INTRAUTERINE DEVICE (IUD) REMOVAL;  Surgeon: Hildred Laser, MD;  Location: [facility];  Service: Gynecology;  Laterality: N/A;   LAPAROSCOPIC OVARIAN CYSTECTOMY Right 02/06/2021   Procedure: LAPAROSCOPIC OVARIAN CYSTECTOMY;  Surgeon: Hildred Laser, MD;  Location: [facility];  Service: Gynecology;  Laterality: Right;   WISDOM TOOTH EXTRACTION     four; age 58   Family History: family history includes COPD in her maternal grandmother; Healthy in her father, maternal grandfather, paternal grandfather, sister, and sister; Heart Problems in her maternal grandmother; Hypertension in her paternal grandmother; Ovarian cancer (age of onset: 68) in her mother. Social History:  reports that she has quit smoking. Her smoking use included cigarettes. She has never used smokeless tobacco. She reports that  she does not currently use drugs after having used the following drugs: Marijuana. She reports that she does not drink alcohol.     Maternal Diabetes: No Genetic Screening: Normal Maternal Ultrasounds/Referrals: IUGR and Other:SUA, marginal cord insertion  Fetal Ultrasounds or other Referrals:  Referred to Materal Fetal Medicine  Maternal Substance Abuse:  No Significant Maternal Medications:  None Significant Maternal Lab Results:  Group B Strep negative Number of Prenatal Visits:greater than 3 verified prenatal visits Maternal Vaccinations:TDap, Flu, and Covid Other Comments:  None  Review of Systems History Dilation: Fingertip Effacement (%): 50 Exam by:: L Adelise Marsh, CNM Blood pressure (!) 137/90, pulse 99, temperature 98.1 F (36.7 C), temperature source Oral, resp. rate 16, height 5' (1.524 m), weight 106.6 kg, last menstrual period 02/23/2023. Maternal Exam:  Introitus: Normal vulva.   Physical Exam Constitutional:      Appearance: Normal appearance.  Cardiovascular:     Rate and Rhythm: Normal rate and regular rhythm.     Pulses: Normal pulses.     Heart sounds: Normal heart sounds.  Pulmonary:     Effort: Pulmonary effort is normal.     Breath sounds: Normal breath sounds.  Abdominal:     Tenderness: There is no abdominal tenderness.     Comments: Gravid, EFW 5.5-6lbs   Genitourinary:    General: Normal vulva.  Comments: SVE 0.5/50/-2 Musculoskeletal:     Cervical back: Normal range of motion and neck supple.     Right lower leg: Edema present.     Left lower leg: Edema present.  Skin:    General: Skin is warm.  Neurological:     General: No focal deficit present.     Mental Status: She is alert.  Psychiatric:        Mood and Affect: Mood normal.   EFM: baseline 135, moderate variability, pos accel, neg decel  TOCO: rare contraction    Prenatal labs: ABO, Rh: --/--/PENDING (03/25 8295) Antibody: PENDING (03/25 0842) Rubella: 1.33 (09/17  1600) RPR: Non Reactive (01/07 1427)  HBsAg: Negative (09/17 1600)  HIV: Non Reactive (01/07 1427)  GBS: Negative/-- (03/05 1541)   Assessment/Plan: G1P0 at 39wks3d admitted for IOL secondary to IUGR.   -Cytotec placed 850  but then fell out when pt got up to use the BR, RN to place another dose. Will recheck in 4 hours, consider ripening balloon then. -Labs pending -IV to HL  -category I tracing, continuous EFM  -GBS negative, membranes intact-AROM when appropriate  -Pain management: pt aware of all options, will ask if desired  Dr Logan Bores aware of admission and plan    Sandra Marsh [facility] 11/26/2023, 9:16 AM

## 2023-11-27 ENCOUNTER — Inpatient Hospital Stay: Admitting: Anesthesiology

## 2023-11-27 ENCOUNTER — Encounter: Payer: Self-pay | Admitting: Obstetrics and Gynecology

## 2023-11-27 DIAGNOSIS — O43193 Other malformation of placenta, third trimester: Secondary | ICD-10-CM | POA: Diagnosis not present

## 2023-11-27 DIAGNOSIS — O99344 Other mental disorders complicating childbirth: Secondary | ICD-10-CM

## 2023-11-27 DIAGNOSIS — O99214 Obesity complicating childbirth: Secondary | ICD-10-CM

## 2023-11-27 DIAGNOSIS — Z3A39 39 weeks gestation of pregnancy: Secondary | ICD-10-CM

## 2023-11-27 DIAGNOSIS — O365930 Maternal care for other known or suspected poor fetal growth, third trimester, not applicable or unspecified: Principal | ICD-10-CM

## 2023-11-27 DIAGNOSIS — E669 Obesity, unspecified: Secondary | ICD-10-CM

## 2023-11-27 DIAGNOSIS — O2603 Excessive weight gain in pregnancy, third trimester: Secondary | ICD-10-CM

## 2023-11-27 DIAGNOSIS — F329 Major depressive disorder, single episode, unspecified: Secondary | ICD-10-CM

## 2023-11-27 DIAGNOSIS — F419 Anxiety disorder, unspecified: Secondary | ICD-10-CM

## 2023-11-27 LAB — CBC
HCT: 32.7 % — ABNORMAL LOW (ref 36.0–46.0)
Hemoglobin: 11 g/dL — ABNORMAL LOW (ref 12.0–15.0)
MCH: 29.2 pg (ref 26.0–34.0)
MCHC: 33.6 g/dL (ref 30.0–36.0)
MCV: 86.7 fL (ref 80.0–100.0)
Platelets: 270 10*3/uL (ref 150–400)
RBC: 3.77 MIL/uL — ABNORMAL LOW (ref 3.87–5.11)
RDW: 14 % (ref 11.5–15.5)
WBC: 18 10*3/uL — ABNORMAL HIGH (ref 4.0–10.5)
nRBC: 0 % (ref 0.0–0.2)

## 2023-11-27 LAB — PROTEIN / CREATININE RATIO, URINE
Creatinine, Urine: 98 mg/dL
Protein Creatinine Ratio: 0.09 mg/mg{creat} (ref 0.00–0.15)
Total Protein, Urine: 9 mg/dL

## 2023-11-27 LAB — COMPREHENSIVE METABOLIC PANEL
ALT: 12 U/L (ref 0–44)
AST: 23 U/L (ref 15–41)
Albumin: 2.8 g/dL — ABNORMAL LOW (ref 3.5–5.0)
Alkaline Phosphatase: 113 U/L (ref 38–126)
Anion gap: 6 (ref 5–15)
BUN: 6 mg/dL (ref 6–20)
CO2: 20 mmol/L — ABNORMAL LOW (ref 22–32)
Calcium: 8.8 mg/dL — ABNORMAL LOW (ref 8.9–10.3)
Chloride: 109 mmol/L (ref 98–111)
Creatinine, Ser: 0.51 mg/dL (ref 0.44–1.00)
GFR, Estimated: >60 mL/min (ref >60)
Glucose, Bld: 99 mg/dL (ref 70–99)
Potassium: 3.5 mmol/L (ref 3.5–5.1)
Sodium: 135 mmol/L (ref 135–145)
Total Bilirubin: 0.5 mg/dL (ref 0.0–1.2)
Total Protein: 6.1 g/dL — ABNORMAL LOW (ref 6.5–8.1)

## 2023-11-27 MED ORDER — FENTANYL CITRATE (PF) 100 MCG/2ML IJ SOLN
INTRAMUSCULAR | Status: AC
  Filled 2023-11-27: qty 2

## 2023-11-27 MED ORDER — IBUPROFEN 600 MG PO TABS
600.0000 mg | ORAL_TABLET | Freq: Four times a day (QID) | ORAL | Status: DC
  Administered 2023-11-27 – 2023-11-28 (×4): 600 mg via ORAL
  Filled 2023-11-27 (×4): qty 1

## 2023-11-27 MED ORDER — OXYTOCIN-SODIUM CHLORIDE 30-0.9 UT/500ML-% IV SOLN: 2.5000 [IU]/h | INTRAVENOUS | Status: DC | PRN

## 2023-11-27 MED ORDER — LIDOCAINE-EPINEPHRINE (PF) 1.5 %-1:200000 IJ SOLN
INTRAMUSCULAR | Status: DC | PRN
  Administered 2023-11-27: 3 mL via PERINEURAL

## 2023-11-27 MED ORDER — LIDOCAINE HCL (CARDIAC) PF 50 MG/5ML IV SOSY
PREFILLED_SYRINGE | INTRAVENOUS | Status: DC | PRN
  Administered 2023-11-27: 3 mL via INTRAVENOUS

## 2023-11-27 MED ORDER — TETANUS-DIPHTH-ACELL PERTUSSIS 5-2.5-18.5 LF-MCG/0.5 IM SUSY: 0.5000 mL | PREFILLED_SYRINGE | Freq: Once | INTRAMUSCULAR | Status: DC

## 2023-11-27 MED ORDER — DIPHENHYDRAMINE HCL 25 MG PO CAPS: 25.0000 mg | ORAL_CAPSULE | Freq: Four times a day (QID) | ORAL | Status: DC | PRN

## 2023-11-27 MED ORDER — FENTANYL CITRATE (PF) 100 MCG/2ML IJ SOLN
100.0000 ug | INTRAMUSCULAR | Status: DC | PRN
  Administered 2023-11-27 (×2): 100 ug via INTRAVENOUS
  Filled 2023-11-27: qty 2

## 2023-11-27 MED ORDER — ACETAMINOPHEN 325 MG PO TABS
650.0000 mg | ORAL_TABLET | ORAL | Status: DC | PRN
  Administered 2023-11-28 (×2): 650 mg via ORAL
  Filled 2023-11-27 (×2): qty 2

## 2023-11-27 MED ORDER — BUPIVACAINE HCL (PF) 0.25 % IJ SOLN
INTRAMUSCULAR | Status: DC | PRN
  Administered 2023-11-27: 2 mL via EPIDURAL
  Administered 2023-11-27: 4 mL via EPIDURAL

## 2023-11-27 MED ORDER — SIMETHICONE 80 MG PO CHEW: 80.0000 mg | CHEWABLE_TABLET | ORAL | Status: DC | PRN

## 2023-11-27 MED ORDER — PRENATAL MULTIVITAMIN CH
1.0000 | ORAL_TABLET | Freq: Every day | ORAL | Status: DC
  Administered 2023-11-28: 1 via ORAL
  Filled 2023-11-27: qty 1

## 2023-11-27 MED ORDER — FENTANYL-BUPIVACAINE-NACL 0.5-0.125-0.9 MG/250ML-% EP SOLN
EPIDURAL | Status: AC
  Filled 2023-11-27: qty 250

## 2023-11-27 MED ORDER — BENZOCAINE-MENTHOL 20-0.5 % EX AERO
1.0000 | INHALATION_SPRAY | CUTANEOUS | Status: DC | PRN
  Filled 2023-11-27: qty 56

## 2023-11-27 MED ORDER — COCONUT OIL OIL
1.0000 | TOPICAL_OIL | Status: DC | PRN
Start: 2023-11-27 — End: 2023-11-28

## 2023-11-27 MED ORDER — OXYCODONE-ACETAMINOPHEN 5-325 MG PO TABS: 1.0000 | ORAL_TABLET | ORAL | Status: DC | PRN

## 2023-11-27 MED ORDER — DOCUSATE SODIUM 100 MG PO CAPS
100.0000 mg | ORAL_CAPSULE | Freq: Two times a day (BID) | ORAL | Status: DC
  Administered 2023-11-27 – 2023-11-28 (×2): 100 mg via ORAL
  Filled 2023-11-27 (×2): qty 1

## 2023-11-27 MED ORDER — ZOLPIDEM TARTRATE 5 MG PO TABS: 5.0000 mg | ORAL_TABLET | Freq: Every evening | ORAL | Status: DC | PRN

## 2023-11-27 NOTE — Progress Notes (Signed)
 Labor Progress Note   ASSESSMENT/PLAN   Naturi A Schoenberger 28 y.o.   G1P0000  at [redacted]w[redacted]d here with IOL.  FWB:  - Fetal well being assessed: Cat 2 - min variability. Position changes and will consider IV bolus      GBS: - GBS Negative/-- (03/05 1541)    LABOR: - SVE 10/100/+1, will begin pushing - Pain Management: epidural - Anticipate SVD   SUBJECTIVE/OBJECTIVE   SUBJECTIVE:    Comfortable with epidural  OBJECTIVE: Vital Signs: Patient Vitals for the past 12 hrs:  BP Temp Temp src Pulse Resp SpO2  11/27/23 0926 130/72 -- -- 84 -- --  11/27/23 0925 -- -- -- -- -- 98 %  11/27/23 0921 (!) 153/79 -- -- 87 -- --  11/27/23 0920 -- -- -- -- -- 99 %  11/27/23 0919 (!) 133/94 -- -- 95 -- --  11/27/23 0915 -- -- -- -- -- 99 %  11/27/23 0905 -- -- -- -- -- 99 %  11/27/23 0900 -- -- -- -- -- 97 %  11/27/23 0806 118/73 -- -- 85 -- --  11/27/23 0530 -- 98.2 F (36.8 C) Oral -- -- --  11/27/23 0328 136/77 98.8 F (37.1 C) Oral 87 16 --  11/27/23 0038 121/63 98.1 F (36.7 C) Oral 79 16 --    Last SVE:  Dilation: 10 Dilation Complete Date: 11/27/23 Dilation Complete Time: 1016 Effacement (%): 100 Cervical Position: Middle Station: Plus 1 Presentation: Vertex Exam by:: E Kelso Bibby, CNM -  , Rupture Date: 11/27/23, Rupture Time: 0034,    FHR:   - Mode: External  - Baseline Rate (A): 140 bpm (fht)  -  Variability - min  - Characteristics (ie - accels, decels): Accelerations: 15 x 15  -  no decelerations  UTERINE ACTIVITY:   - Mode: Toco  - Contraction Frequency (min): 2-4 min Pitocin: 10 mU/min

## 2023-11-27 NOTE — Lactation Note (Signed)
 This note was copied from a baby's chart. Lactation Consultation Note  Patient Name: Sandra Marsh ZOXWR'[id number] Date: 11/27/2023 Age:29 hours Reason for consult: Primapara;Follow-up assessment;Breastfeeding assistance   Maternal Data Has patient been taught Hand Expression?: Yes  Feeding Mother's Current Feeding Choice: Breast Milk  LATCH Score Latch: Grasps breast easily, tongue down, lips flanged, rhythmical sucking.  Audible Swallowing: A few with stimulation  Type of Nipple: Everted at rest and after stimulation  Comfort (Breast/Nipple): Filling, red/small blisters or bruises, mild/mod discomfort  Hold (Positioning): No assistance needed to correctly position infant at breast.  LATCH Score: 8   Lactation Tools Discussed/Used  Infant of P1 mom nursing at right breast in football hold when [facility] arrived. Father of baby at bedside and attentive. Baby has rhythmic suck bursts, minimal stimulation needed to stay active, lips flanged but slightly shallow latch and mild crease on nipple when finished. Mom states baby does not take left side as well as right. Discussed feeding from each side at each feed with cues. Baby can curl tongue around a gloved finger and can protrude tongue past gum line. Parents taught tapping of baby's lips during wakeful time to let baby explore and practice opening mouth wider. Latched in football hold by mom after hand expression on left side and mom states it is a better latch than previous attempts. Remained active with occasional swallows for 15 minutes and self unlatched.  Mild nipple tenderness. Hydrogels given with instructions. RN to request order for coconut oil.  Pump counseling given for mom to occasionally express and give a bottle around 3-4 weeks for practice and to pump 1-2 times a day after feeding around 2 weeks prior to returning to work. 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 Dad requested to learn to swaddle. Discussed freedom of  leg movement and hands available near mouth if cueing to feed is necessary at the time.  Interventions Interventions: Coconut oil;Comfort gels;Breast feeding basics reviewed;Support pillows;Position options;Infant Driven Feeding Algorithm education;LC Services brochure;CDC milk storage guidelines;CDC Guidelines for Breast Pump Cleaning  Discharge Discharge Education: Engorgement and breast care;Warning signs for feeding baby Pump: Personal (Spectra - going back to work in a few months and plans to pump there)  Consult Status Consult Status: Follow-up    Matt Holmes 11/27/2023, 7:19 PM

## 2023-11-27 NOTE — Lactation Note (Signed)
 This note was copied from a baby's chart. Lactation Consultation Note  Patient Name: Sandra Marsh ZOXWR'U Date: 11/27/2023 Age:30 hours Reason for consult: Initial assessment;Primapara;Term   Maternal Data Has patient been taught Hand Expression?: Yes Does the patient have breastfeeding experience prior to this delivery?: No  Initial assessment w/ a P1 patient and a 4hr old baby Sandra "Luna".  This was a SVD.  No pregnancy complications.  Patient w/ a hx of PCOS, depression, anxiety and fast pushing stage.   Patient stated that she felt feedings were going good, but she did feel a little pain while nursing.  She had only been taught cradle hold for nursing.   Feeding Mother's Current Feeding Choice: Breast Milk  With the help of FOB, LC placed infant to the rt breast in football hold.  Patient was taught hand expression and positioning of the infant at the breast.  Infant latched and actively sucked at the breast.  Strong tugs were felt by patient, and audible swallows were heard.   LATCH Score Latch: Grasps breast easily, tongue down, lips flanged, rhythmical sucking.  Audible Swallowing: Spontaneous and intermittent  Type of Nipple: Everted at rest and after stimulation (Short nipple when erect)  Comfort (Breast/Nipple): Soft / non-tender  Hold (Positioning): Assistance needed to correctly position infant at breast and maintain latch.  LATCH Score: 9  Interventions Interventions: Breast feeding basics reviewed;Assisted with latch;Skin to skin;Breast massage;Hand express;Breast compression;Adjust position;Support pillows;Position options;Education  LC provided education on the following;  milk production expectations, hunger cues, day 1/2 wet/dirty diapers, hand expression, cluster feeding, benefits of STS and arousing infant for a feeding.  Lactation informed patient of feeding infant at least 8 or more times w/in a 24hr period but not exceeding 3hrs. Patient verbalized  understanding.   Discharge Pump: Personal;DEBP (Spectra)  Consult Status Consult Status: Follow-up Follow-up type: In-patient    [name] 11/27/2023, 3:49 PM

## 2023-11-27 NOTE — Progress Notes (Signed)
 Sandra Marsh is a 28 y.o. G1P0000 at [redacted]w[redacted]d by LMP admitted for IOL due to IUGR.  Subjective: Sandra Marsh is resting in between contractions. She feels like her contractions are getting stronger.   Objective: BP 121/63   Pulse 79   Temp 98.1 F (36.7 C) (Oral)   Resp 16   Ht 5' (1.524 m)   Wt 106.6 kg   LMP 02/23/2023 (Approximate)   BMI 45.90 kg/m  No intake/output data recorded. No intake/output data recorded.  FHT:  FHR: 145 bpm, variability: moderate,  accelerations:  Present,  decelerations:  Absent UC:   regular, every 2-3 minutes SVE:   Dilation: 4 Effacement (%): 60 Station: -1 Exam by:: RWolffordCNM  Labs: Lab Results  Component Value Date   WBC 11.8 (H) 11/26/2023   HGB 12.2 11/26/2023   HCT 36.9 11/26/2023   MCV 86.6 11/26/2023   PLT 327 11/26/2023    Assessment / Plan: Induction of labor due to IUGR,  progressing well on pitocin Continuous EFM AROM 0034 clear fluid Continue to uptitrate pitocin Will recheck in about 4 hours Labor:  Progressing on pitocin.  Preeclampsia:   n/a Fetal Wellbeing:  Category I Pain Control:  Labor support without medications I/D:  n/a Anticipated MOD:  NSVD  Eloise Levels, Student-MidWife 11/27/2023, 12:38 AM Carie Caddy, CNM present for all portions of care.

## 2023-11-27 NOTE — Anesthesia Preprocedure Evaluation (Signed)
 Anesthesia Evaluation  Patient identified by MRN, date of birth, ID band Patient awake    Reviewed: Allergy & Precautions, H&P , NPO status , Patient's Chart, lab work & pertinent test results  Airway Mallampati: II       Dental no notable dental hx.    Pulmonary former smoker          Cardiovascular negative cardio ROS      Neuro/Psych   Anxiety Depression    negative neurological ROS     GI/Hepatic Neg liver ROS,GERD  ,,  Endo/Other  negative endocrine ROS    Renal/GU negative Renal ROS  negative genitourinary   Musculoskeletal   Abdominal   Peds  Hematology negative hematology ROS (+)   Anesthesia Other Findings   Reproductive/Obstetrics (+) Pregnancy                             Anesthesia Physical Anesthesia Plan  ASA: 2  Anesthesia Plan: Epidural   Post-op Pain Management:    Induction:   PONV Risk Score and Plan:   Airway Management Planned:   Additional Equipment:   Intra-op Plan:   Post-operative Plan:   Informed Consent: I have reviewed the patients History and Physical, chart, labs and discussed the procedure including the risks, benefits and alternatives for the proposed anesthesia with the patient or authorized representative who has indicated his/her understanding and acceptance.       Plan Discussed with: CRNA and Anesthesiologist  Anesthesia Plan Comments:        Anesthesia Quick Evaluation

## 2023-11-27 NOTE — Anesthesia Procedure Notes (Signed)
 Epidural Patient location during procedure: OB Start time: 11/27/2023 9:12 AM End time: 11/27/2023 9:24 AM  Staffing Anesthesiologist: Piscitello, Cleda Mccreedy, MD Resident/CRNA: Elmarie Mainland, CRNA Performed: resident/CRNA   Preanesthetic Checklist Completed: patient identified, IV checked, site marked, risks and benefits discussed, surgical consent, monitors and equipment checked, pre-op evaluation and timeout performed  Epidural Patient position: sitting Prep: ChloraPrep Patient monitoring: heart rate, continuous pulse ox and blood pressure Approach: midline Location: L3-L4 Injection technique: LOR saline  Needle:  Needle type: Tuohy  Needle gauge: 17 G Needle length: 9 cm and 9 Needle insertion depth: 9 cm Catheter type: closed end flexible Catheter size: 19 Gauge Catheter at skin depth: 13 cm Test dose: negative and 1.5% lidocaine with Epi 1:200 K  Assessment Sensory level: T10 Events: blood not aspirated, no cerebrospinal fluid, injection not painful, no injection resistance, no paresthesia and negative IV test  Additional Notes 1 attempt Pt. Evaluated and documentation done after procedure finished. Patient identified. Risks/Benefits/Options discussed with patient including but not limited to bleeding, infection, nerve damage, paralysis, failed block, incomplete pain control, headache, blood pressure changes, nausea, vomiting, reactions to medication both or allergic, itching and postpartum back pain. Confirmed with bedside nurse the patient's most recent platelet count. Confirmed with patient that they are not currently taking any anticoagulation, have any bleeding history or any family history of bleeding disorders. Patient expressed understanding and wished to proceed. All questions were answered. Sterile technique was used throughout the entire procedure. Please see nursing notes for vital signs. Test dose was given through epidural catheter and negative prior to  continuing to dose epidural or start infusion. Warning signs of high block given to the patient including shortness of breath, tingling/numbness in hands, complete motor block, or any concerning symptoms with instructions to call for help. Patient was given instructions on fall risk and not to get out of bed. All questions and concerns addressed with instructions to call with any issues or inadequate analgesia.    Patient tolerated the insertion well without immediate complications.Reason for block:procedure for pain

## 2023-11-27 NOTE — Progress Notes (Signed)
 Sandra Marsh is a 27 y.o. G1P0000 at [redacted]w[redacted]d by LMP admitted for IOL  Subjective: Sleeping at this time. Has been ambulating around unit intermittently.    Objective: BP 121/63   Pulse 79   Temp 98.1 F (36.7 C) (Oral)   Resp 16   Ht 5' (1.524 m)   Wt 106.6 kg   LMP 02/23/2023 (Approximate)   BMI 45.90 kg/m  No intake/output data recorded. No intake/output data recorded.  FHT:  FHR: 125 bpm, variability: moderate,  accelerations:  Present,  decelerations:  Present intermittent early decels UC:   regular, every 2-5 minutes SVE:   Dilation: 4 Effacement (%): 60 Station: -1 Exam by:: RWolffordCNM  Labs: Lab Results  Component Value Date   WBC 11.8 (H) 11/26/2023   HGB 12.2 11/26/2023   HCT 36.9 11/26/2023   MCV 86.6 11/26/2023   PLT 327 11/26/2023    Assessment / Plan: Induction of labor due to IUGR, on pitocin Continuous EFM Labor:  continue to uptitrate pitocin  and will defer cervical exam at this time Preeclampsia:   n/a Fetal Wellbeing:  Category I Pain Control:  desires IV pain medication at this time I/D:  n/a Anticipated MOD:  NSVD  Eloise Levels, Student-MidWife 11/27/2023, 3:40 AM Carie Caddy, CNM present for all portions of care.

## 2023-11-28 MED ORDER — ACETAMINOPHEN 325 MG PO TABS: 650.0000 mg | ORAL_TABLET | ORAL | Status: DC | PRN

## 2023-11-28 MED ORDER — IBUPROFEN 600 MG PO TABS: 600.0000 mg | ORAL_TABLET | Freq: Four times a day (QID) | ORAL | Status: DC

## 2023-11-28 NOTE — Lactation Note (Signed)
 This note was copied from a baby's chart. Lactation Consultation Note  Patient Name: Sandra Marsh ZOXWR'[id number] Date: 11/28/2023 Age:28 Reason for consult: Follow-up assessment;Primapara;Maternal discharge   Maternal Data Does the patient have breastfeeding experience prior to this delivery?: No MOB and baby preparing for discharge, reports planning on exclusively breastfeeding for as long as possible upon returning home. MOB states she is planning on using Spectra S2 in addition to offering breast. Pt states she is aware of how to utilize and set up pump, clean parts, store milk, etc. MOB requests LC assess bruising on breast where infant aggressively latched/gummed alongside areola on left breast. Feeding Mother's Current Feeding Choice: Breast Milk FOB and baby walking in hallway upon LC room entry, no feeding observed or requested for observation at this time.    Interventions  LC visual examination of breast recommends utilizing ice for pain and swelling from bruises and recommends assistance with latching if discomfort continues to occur.   Discharge   Discussed engorgement and mastitis prevention and protocol, infant hunger cues, and breast care.   Consult Status  Complete    Lennie Hummer 11/28/2023, 3:07 PM

## 2023-11-28 NOTE — Discharge Summary (Signed)
 Postpartum Discharge Summary    Patient Name: Sandra Marsh DOB: 02-28-1996 MRN: [id number]  Date of admission: 11/26/2023 Delivery date:11/27/2023 Delivering provider: Oley Balm Date of discharge: 11/28/2023  Admitting diagnosis: Encounter for induction of labor [Z34.90] Intrauterine pregnancy: [redacted]w[redacted]d     Secondary diagnosis:  Principal Problem:   Encounter for induction of labor Active Problems:   Anxiety and depression   Supervision of normal pregnancy   Single umbilical artery affecting management of mother in singleton pregnancy, antepartum   Marginal insertion of umbilical cord affecting management of mother in second trimester   Obesity in pregnancy, antepartum, third trimester   IUGR (intrauterine growth restriction) affecting care of mother   SVD (spontaneous vaginal delivery)      Discharge diagnosis: Term Pregnancy Delivered                                              Post partum procedures: none Augmentation: AROM, Pitocin, Cytotec, and IP Foley Complications: None  Hospital course: Induction of Labor With Vaginal Delivery   28 y.o. yo G1P1001 at [redacted]w[redacted]d was admitted to the hospital 11/26/2023 for induction of labor.  Indication for induction:  IUGR .  Patient had an uncomplicated labor course. Membrane Rupture Time/Date: 12:34 AM,11/27/2023  Delivery Method:Vaginal, Spontaneous Operative Delivery:N/A Episiotomy: None Lacerations:  None Details of delivery can be found in separate delivery note.  Patient had an uncomplicated postpartum course. Patient is discharged home 11/28/23.  Newborn Data: Birth date:11/27/2023 Birth time:10:48 AM Gender:Female Living status:Living Apgars:9 ,10  Weight:2980 g  Magnesium Sulfate received: No BMZ received: No Rhophylac:N/A MMR:Yes T-DaP:Given prenatally Flu: Yes RSV Vaccine received: No Transfusion:No Immunizations administered: Immunization History  Administered Date(s) Administered   HPV 9-valent  04/03/2006, 08/08/2006, 10/10/2007   Influenza, Seasonal, Injecte, Preservative Fre 06/18/2023   Influenza-Unspecified 08/02/2017   Pfizer Covid-19 Vaccine Bivalent Booster 29yrs & up 10/30/2019, 11/20/2019, 07/23/2020, 07/17/2021, 07/18/2022   Tdap 09/07/2019, 09/10/2023    Physical exam  Vitals:   11/27/23 1925 11/27/23 2337 11/28/23 0342 11/28/23 0835  BP: 131/78 113/78 119/85 113/79  Pulse: 93 96 92 92  Resp: 18 16 18 18   Temp: 97.6 F (36.4 C) 98.1 F (36.7 C) 98.1 F (36.7 C) 97.7 F (36.5 C)  TempSrc: Oral Oral Oral Oral  SpO2: 96% 96% 96% 98%  Weight:      Height:       General: alert, cooperative, and no distress Lochia: appropriate Uterine Fundus: firm Incision: N/A DVT Evaluation: No evidence of DVT seen on physical exam. Labs: Lab Results  Component Value Date   WBC 18.0 (H) 11/27/2023   HGB 11.0 (L) 11/27/2023   HCT 32.7 (L) 11/27/2023   MCV 86.7 11/27/2023   PLT 270 11/27/2023      Latest Ref Rng & Units 11/27/2023    4:36 PM  CMP  Glucose 70 - 99 mg/dL 99   BUN 6 - 20 mg/dL 6   Creatinine 5.62 - 1.30 mg/dL 8.65   Sodium 784 - 696 mmol/L 135   Potassium 3.5 - 5.1 mmol/L 3.5   Chloride 98 - 111 mmol/L 109   CO2 22 - 32 mmol/L 20   Calcium 8.9 - 10.3 mg/dL 8.8   Total Protein 6.5 - 8.1 g/dL 6.1   Total Bilirubin 0.0 - 1.2 mg/dL 0.5   Alkaline Phos 38 - 126 U/L 113  AST 15 - 41 U/L 23   ALT 0 - 44 U/L 12    Edinburgh Score:    11/28/2023    4:04 AM  Edinburgh Postnatal Depression Scale Screening Tool  I have been able to laugh and see the funny side of things. 0  I have looked forward with enjoyment to things. 0  I have blamed myself unnecessarily when things went wrong. 1  I have been anxious or worried for no good reason. 2  I have felt scared or panicky for no good reason. 1  Things have been getting on top of me. 2  I have been so unhappy that I have had difficulty sleeping. 1  I have felt sad or miserable. 0  I have been so unhappy  that I have been crying. 1  The thought of harming myself has occurred to me. 0  Edinburgh Postnatal Depression Scale Total 8      After visit meds:  Allergies as of 11/28/2023       Reactions   Nickel Rash        Medication List     TAKE these medications    acetaminophen 325 MG tablet Commonly known as: Tylenol Take 2 tablets (650 mg total) by mouth every 4 (four) hours as needed (for pain scale < 4).   ibuprofen 600 MG tablet Commonly known as: ADVIL Take 1 tablet (600 mg total) by mouth every 6 (six) hours.   lamoTRIgine 200 MG tablet Commonly known as: LAMICTAL Take 200 mg by mouth in the morning.   lamoTRIgine 25 MG tablet Commonly known as: LAMICTAL Take 50 mg by mouth at bedtime.   multivitamin-prenatal 27-0.8 MG Tabs tablet Take 1 tablet by mouth daily at 12 noon.         Discharge home in stable condition Infant Feeding: Breast Infant Disposition:home with mother Discharge instruction: per After Visit Summary and Postpartum booklet. Activity: Advance as tolerated. Pelvic rest for 6 weeks.  Diet: routine diet Anticipated Birth Control: OCPs Postpartum Appointment:6 weeks Additional Postpartum F/U: Postpartum Depression checkup Future Appointments:No future appointments. Follow up Visit:      11/28/2023 Sandra Marsh, CNM

## 2023-11-28 NOTE — Discharge Instructions (Signed)

## 2023-11-28 NOTE — Anesthesia Postprocedure Evaluation (Signed)
 Anesthesia Post Note  Patient: Sandra Marsh  Procedure(s) Performed: AN AD HOC EPIDURAL  Patient location during evaluation: Mother Baby Anesthesia Type: Epidural Level of consciousness: awake and alert and oriented Pain management: pain level controlled Vital Signs Assessment: post-procedure vital signs reviewed and stable Respiratory status: spontaneous breathing and respiratory function stable Cardiovascular status: blood pressure returned to baseline Postop Assessment: no headache, no backache, patient able to bend at knees, no apparent nausea or vomiting, adequate PO intake and able to ambulate Anesthetic complications: no   No notable events documented.   Last Vitals:  Vitals:   11/27/23 2337 11/28/23 0342  BP: 113/78 119/85  Pulse: 96 92  Resp: 16 18  Temp: 36.7 C 36.7 C  SpO2: 96% 96%    Last Pain:  Vitals:   11/28/23 0440  TempSrc:   PainSc: 2                  Raylon Lamson D [place]

## 2023-11-28 NOTE — Anesthesia Postprocedure Evaluation (Signed)
 Anesthesia Post Note  Patient: Sandra Marsh  Procedure(s) Performed: AN AD HOC LABOR EPIDURAL  Patient location during evaluation: Mother Baby Anesthesia Type: Epidural Level of consciousness: awake and alert and oriented Pain management: pain level controlled Vital Signs Assessment: post-procedure vital signs reviewed and stable Respiratory status: spontaneous breathing and respiratory function stable Cardiovascular status: blood pressure returned to baseline Postop Assessment: no headache, no backache, no apparent nausea or vomiting, adequate PO intake, able to ambulate and patient able to bend at knees Anesthetic complications: no   No notable events documented.   Last Vitals:  Vitals:   11/28/23 0342 11/28/23 0835  BP: 119/85 113/79  Pulse: 92 92  Resp: 18 18  Temp: 36.7 C 36.5 C  SpO2: 96% 98%    Last Pain:  Vitals:   11/28/23 0835  TempSrc: Oral  PainSc:                  Katherine Basset

## 2023-12-13 DIAGNOSIS — F509 Eating disorder, unspecified: Secondary | ICD-10-CM | POA: Diagnosis not present

## 2023-12-13 DIAGNOSIS — F39 Unspecified mood [affective] disorder: Secondary | ICD-10-CM | POA: Diagnosis not present

## 2023-12-13 DIAGNOSIS — F902 Attention-deficit hyperactivity disorder, combined type: Secondary | ICD-10-CM | POA: Diagnosis not present

## 2023-12-13 DIAGNOSIS — F411 Generalized anxiety disorder: Secondary | ICD-10-CM | POA: Diagnosis not present

## 2023-12-20 DIAGNOSIS — F1211 Cannabis abuse, in remission: Secondary | ICD-10-CM | POA: Diagnosis not present

## 2023-12-20 DIAGNOSIS — F902 Attention-deficit hyperactivity disorder, combined type: Secondary | ICD-10-CM | POA: Diagnosis not present

## 2023-12-20 DIAGNOSIS — F39 Unspecified mood [affective] disorder: Secondary | ICD-10-CM | POA: Diagnosis not present

## 2023-12-20 DIAGNOSIS — F411 Generalized anxiety disorder: Secondary | ICD-10-CM | POA: Diagnosis not present

## 2024-01-01 ENCOUNTER — Encounter: Payer: Self-pay | Admitting: Certified Nurse Midwife

## 2024-01-01 DIAGNOSIS — F603 Borderline personality disorder: Secondary | ICD-10-CM | POA: Diagnosis not present

## 2024-01-02 ENCOUNTER — Ambulatory Visit (INDEPENDENT_AMBULATORY_CARE_PROVIDER_SITE_OTHER): Admitting: Obstetrics

## 2024-01-02 ENCOUNTER — Other Ambulatory Visit: Payer: Self-pay | Admitting: Obstetrics

## 2024-01-02 ENCOUNTER — Ambulatory Visit
Admission: RE | Admit: 2024-01-02 | Discharge: 2024-01-02 | Disposition: A | Discharge status: Home / Self Care | Source: Ambulatory Visit | Attending: Obstetrics | Admitting: Obstetrics

## 2024-01-02 VITALS — BP 120/85 | HR 104 | Ht 60.0 in | Wt 212.0 lb

## 2024-01-02 DIAGNOSIS — N939 Abnormal uterine and vaginal bleeding, unspecified: Secondary | ICD-10-CM

## 2024-01-02 DIAGNOSIS — R9389 Abnormal findings on diagnostic imaging of other specified body structures: Secondary | ICD-10-CM | POA: Diagnosis not present

## 2024-01-02 NOTE — Progress Notes (Signed)
    GYNECOLOGY PROGRESS NOTE  Subjective:  PCP: [facility]  Patient ID: Ares A Plaia, female    DOB: [birth year], 28 y.o.   MRN: [id number]  HPI  Patient is a 28 y.o. G41P1001 female who presents for heavy bleeding for the past 3 days, is s/p SVD on 11/27/23, is 5 weeks postpartum now. States her initial bleeding from delivery had slowed and pretty much stopped two weeks ago.  Is breastfeeding almost exclusively and doesn't think this bleeding is her period. She is bleeding through a heavy flow pad every hour and passing large blood clots almost every time she use the restroom (they are about the size of 2 quarter coins) and feels foggy, dizzy and tired even after sleep. She remembers after delivery, CNM telling her the placenta "didn't want to come out." Delivery note documentation notes: "The placenta delivered spontaneously at 30 minutes after birth and was noted to be intact with a 2VC and marginal insertion" and EBL was .  Discharge H/H 11.0/32.7. Periods when off contraception are very heavy, but not this heavy.   The following portions of the patient's history were reviewed and updated as appropriate: allergies, current medications, past family history, past medical history, past social history, past surgical history, and problem list.  Review of Systems Pertinent items are noted in HPI.   Objective:   Blood pressure 120/85, pulse (!) 104, height 5' (1.524 m), weight 212 lb (96.2 kg), last menstrual period 12/31/2023, currently breastfeeding. Body mass index is 41.4 kg/m.  General appearance: alert, cooperative, and no distress Abdomen: soft, non-tender; bowel sounds normal; no masses,  no organomegaly Pelvic: cervix normal in appearance, external genitalia normal, no adnexal masses or tenderness, no cervical motion tenderness, rectovaginal septum normal, uterus normal size, shape, and consistency, vagina normal without discharge, and clots in vaginal  vault. Extremities: extremities normal, atraumatic, no cyanosis or edema Neurologic: Grossly normal Cap refill < 2 sec  Assessment/Plan:   1. Episode of heavy vaginal bleeding    28 y.o. G1P1001 s/p SVD 5wks ago without complications, possibly an adherent placenta which eventually delivered spontaneously, now with new onset of 3d heavy bleeding after lochia ceased two weeks ago. Discussed this could be return of her menses and is heavier than expected, or could indicate some retained POCs. Discussed if POCs, would recommend D&C. Discussed R/B/A of the procedure and patient consents if necessary. If H/H extremely low, will either start iron or order IV, reviewed dosing and SE. Stat CBC and pelvic US , will notify of results. Was taking Yaz OCP prior to pregnancy and planning to re-start postpartum, defers today due to breastfeeding.    Sofia Dunn, DO [facility]

## 2024-01-03 ENCOUNTER — Encounter
Admission: RE | Disposition: A | Discharge status: Home / Self Care | Payer: Self-pay | Source: Home / Self Care | Attending: Obstetrics

## 2024-01-03 ENCOUNTER — Ambulatory Visit
Admission: RE | Admit: 2024-01-03 | Discharge: 2024-01-03 | Disposition: A | Discharge status: Home / Self Care | Attending: Obstetrics | Admitting: Obstetrics

## 2024-01-03 ENCOUNTER — Encounter: Payer: Self-pay | Admitting: Obstetrics

## 2024-01-03 ENCOUNTER — Ambulatory Visit: Admitting: Anesthesiology

## 2024-01-03 ENCOUNTER — Other Ambulatory Visit: Payer: Self-pay

## 2024-01-03 ENCOUNTER — Other Ambulatory Visit: Payer: Self-pay | Admitting: Obstetrics

## 2024-01-03 DIAGNOSIS — Z87891 Personal history of nicotine dependence: Secondary | ICD-10-CM | POA: Insufficient documentation

## 2024-01-03 DIAGNOSIS — N939 Abnormal uterine and vaginal bleeding, unspecified: Secondary | ICD-10-CM

## 2024-01-03 HISTORY — PX: DILATION AND EVACUATION: SHX1459

## 2024-01-03 LAB — CBC
Hematocrit: 40.4 % (ref 34.0–46.6)
Hemoglobin: 13.3 g/dL (ref 11.1–15.9)
MCH: 28.1 pg (ref 26.6–33.0)
MCHC: 32.9 g/dL (ref 31.5–35.7)
MCV: 85 fL (ref 79–97)
Platelets: 338 10*3/uL (ref 150–450)
RBC: 4.73 x10E6/uL (ref 3.77–5.28)
RDW: 12.2 % (ref 11.7–15.4)
WBC: 11.5 10*3/uL — ABNORMAL HIGH (ref 3.4–10.8)

## 2024-01-03 LAB — POCT PREGNANCY, URINE: Preg Test, Ur: NEGATIVE

## 2024-01-03 SURGERY — DILATION AND EVACUATION, UTERUS
Anesthesia: General | Site: Uterus

## 2024-01-03 MED ORDER — FENTANYL CITRATE (PF) 100 MCG/2ML IJ SOLN
INTRAMUSCULAR | Status: AC
  Filled 2024-01-03: qty 2

## 2024-01-03 MED ORDER — 0.9 % SODIUM CHLORIDE (POUR BTL) OPTIME
TOPICAL | Status: DC | PRN
  Administered 2024-01-03: 500 mL

## 2024-01-03 MED ORDER — PHENYLEPHRINE 80 MCG/ML (10ML) SYRINGE FOR IV PUSH (FOR BLOOD PRESSURE SUPPORT)
PREFILLED_SYRINGE | INTRAVENOUS | Status: DC | PRN
  Administered 2024-01-03: 80 ug via INTRAVENOUS

## 2024-01-03 MED ORDER — CHLORHEXIDINE GLUCONATE 0.12 % MT SOLN: 15.0000 mL | Freq: Once | OROMUCOSAL | Status: DC

## 2024-01-03 MED ORDER — DOXYCYCLINE HYCLATE 100 MG IV SOLR
200.0000 mg | INTRAVENOUS | Status: AC
  Administered 2024-01-03: 200 mg via INTRAVENOUS
  Filled 2024-01-03 (×3): qty 200

## 2024-01-03 MED ORDER — ACETAMINOPHEN 500 MG PO TABS
1000.0000 mg | ORAL_TABLET | ORAL | Status: AC
  Administered 2024-01-03: 1000 mg via ORAL

## 2024-01-03 MED ORDER — OXYCODONE HCL 5 MG PO TABS
5.0000 mg | ORAL_TABLET | Freq: Once | ORAL | Status: AC | PRN
  Administered 2024-01-03: 5 mg via ORAL

## 2024-01-03 MED ORDER — ACETAMINOPHEN 500 MG PO TABS
ORAL_TABLET | ORAL | Status: AC
  Filled 2024-01-03: qty 2

## 2024-01-03 MED ORDER — CHLORHEXIDINE GLUCONATE 0.12 % MT SOLN
OROMUCOSAL | Status: AC
  Filled 2024-01-03: qty 15

## 2024-01-03 MED ORDER — PROPOFOL 10 MG/ML IV BOLUS
INTRAVENOUS | Status: DC | PRN
  Administered 2024-01-03: 200 mg via INTRAVENOUS

## 2024-01-03 MED ORDER — FENTANYL CITRATE (PF) 100 MCG/2ML IJ SOLN: 25.0000 ug | INTRAMUSCULAR | Status: DC | PRN

## 2024-01-03 MED ORDER — SODIUM CHLORIDE 0.9% FLUSH: 3.0000 mL | INTRAVENOUS | Status: DC | PRN

## 2024-01-03 MED ORDER — SILVER NITRATE-POT NITRATE 75-25 % EX MISC
CUTANEOUS | Status: DC | PRN
  Administered 2024-01-03: 1

## 2024-01-03 MED ORDER — OXYCODONE HCL 5 MG/5ML PO SOLN: 5.0000 mg | Freq: Once | ORAL | Status: AC | PRN

## 2024-01-03 MED ORDER — LACTATED RINGERS IV SOLN
INTRAVENOUS | Status: DC
Start: 2024-01-03 — End: 2024-01-03

## 2024-01-03 MED ORDER — LIDOCAINE HCL (CARDIAC) PF 100 MG/5ML IV SOSY
PREFILLED_SYRINGE | INTRAVENOUS | Status: DC | PRN
Start: 2024-01-03 — End: 2024-01-03
  Administered 2024-01-03: 100 mg via INTRAVENOUS

## 2024-01-03 MED ORDER — CHLORHEXIDINE GLUCONATE 0.12 % MT SOLN
15.0000 mL | Freq: Once | OROMUCOSAL | Status: AC
  Administered 2024-01-03: 15 mL via OROMUCOSAL

## 2024-01-03 MED ORDER — MIDAZOLAM HCL 2 MG/2ML IJ SOLN
INTRAMUSCULAR | Status: DC | PRN
  Administered 2024-01-03: 2 mg via INTRAVENOUS

## 2024-01-03 MED ORDER — DEXAMETHASONE SODIUM PHOSPHATE 10 MG/ML IJ SOLN
INTRAMUSCULAR | Status: DC | PRN
  Administered 2024-01-03: 10 mg via INTRAVENOUS

## 2024-01-03 MED ORDER — FENTANYL CITRATE (PF) 100 MCG/2ML IJ SOLN
INTRAMUSCULAR | Status: DC | PRN
  Administered 2024-01-03 (×2): 50 ug via INTRAVENOUS

## 2024-01-03 MED ORDER — OXYCODONE-ACETAMINOPHEN 5-325 MG PO TABS: 1.0000 | ORAL_TABLET | Freq: Three times a day (TID) | ORAL | 0 refills | Status: DC | PRN

## 2024-01-03 MED ORDER — DEXMEDETOMIDINE HCL IN NACL 80 MCG/20ML IV SOLN
INTRAVENOUS | Status: DC | PRN
  Administered 2024-01-03: 8 ug via INTRAVENOUS

## 2024-01-03 MED ORDER — SODIUM CHLORIDE 0.9% FLUSH: 3.0000 mL | Freq: Two times a day (BID) | INTRAVENOUS | Status: DC

## 2024-01-03 MED ORDER — ORAL CARE MOUTH RINSE: 15.0000 mL | Freq: Once | OROMUCOSAL | Status: AC

## 2024-01-03 MED ORDER — SILVER NITRATE-POT NITRATE 75-25 % EX MISC
CUTANEOUS | Status: AC
  Filled 2024-01-03: qty 10

## 2024-01-03 MED ORDER — KETOROLAC TROMETHAMINE 30 MG/ML IJ SOLN
INTRAMUSCULAR | Status: DC | PRN
  Administered 2024-01-03: 30 mg via INTRAVENOUS

## 2024-01-03 MED ORDER — OXYCODONE HCL 5 MG PO TABS
ORAL_TABLET | ORAL | Status: AC
  Filled 2024-01-03: qty 1

## 2024-01-03 MED ORDER — MIDAZOLAM HCL 2 MG/2ML IJ SOLN
INTRAMUSCULAR | Status: AC
  Filled 2024-01-03: qty 2

## 2024-01-03 MED ORDER — IBUPROFEN 800 MG PO TABS: 800.0000 mg | ORAL_TABLET | Freq: Three times a day (TID) | ORAL | 0 refills | Status: DC | PRN

## 2024-01-03 MED ORDER — PROPOFOL 10 MG/ML IV BOLUS
INTRAVENOUS | Status: AC
  Filled 2024-01-03: qty 20

## 2024-01-03 SURGICAL SUPPLY — 25 items
CATH ROBINSON RED A/P 16FR (CATHETERS) IMPLANT
COVER LIGHT HANDLE STERIS (MISCELLANEOUS) IMPLANT
DRSG TELFA 3X8 NADH STRL (GAUZE/BANDAGES/DRESSINGS) ×1 IMPLANT
FILTER UTR ASPR SPEC (MISCELLANEOUS) ×1 IMPLANT
GLOVE BIO SURGEON STRL SZ 6 (GLOVE) ×1 IMPLANT
GLOVE BIOGEL PI IND STRL 6 (GLOVE) ×1 IMPLANT
GOWN STRL REUS W/ TWL LRG LVL3 (GOWN DISPOSABLE) ×1 IMPLANT
KIT BERKELEY 1ST TRIMESTER 3/8 (MISCELLANEOUS) ×1 IMPLANT
KIT TURNOVER KIT A (KITS) ×1 IMPLANT
NDL SPNL 20GX3.5 QUINCKE YW (NEEDLE) ×1 IMPLANT
NEEDLE SPNL 20GX3.5 QUINCKE YW (NEEDLE) ×1 IMPLANT
PACK CYSTO AR (MISCELLANEOUS) IMPLANT
PAD OB MATERNITY 11 LF (PERSONAL CARE ITEMS) ×1 IMPLANT
PAD PREP OB/GYN DISP 24X41 (PERSONAL CARE ITEMS) ×1 IMPLANT
SCRUB CHG 4% DYNA-HEX 4OZ (MISCELLANEOUS) ×1 IMPLANT
SET BERKELEY SUCTION TUBING (SUCTIONS) ×1 IMPLANT
SOLUTION PREP PVP 2OZ (MISCELLANEOUS) ×1 IMPLANT
SURGILUBE 2OZ TUBE FLIPTOP (MISCELLANEOUS) IMPLANT
SYR 10ML LL (SYRINGE) ×1 IMPLANT
TRAP FLUID SMOKE EVACUATOR (MISCELLANEOUS) ×1 IMPLANT
VACURETTE 10 RIGID CVD (CANNULA) IMPLANT
VACURETTE 12 RIGID CVD (CANNULA) IMPLANT
VACURETTE 7MM F TIP STRL (CANNULA) ×1 IMPLANT
VACURETTE 8 RIGID CVD (CANNULA) IMPLANT
WATER STERILE IRR 500ML POUR (IV SOLUTION) ×1 IMPLANT

## 2024-01-03 NOTE — Interval H&P Note (Signed)
 History and Physical Interval Note:  01/03/2024 2:15 PM  Sandra Marsh  has presented today for surgery, with the diagnosis of retained products of conception.  The various methods of treatment have been discussed with the patient and family. After consideration of risks, benefits and other options for treatment, the patient has consented to  Procedure(s): DILATION AND EVACUATION, UTERUS (N/A) as a surgical intervention.  The patient's history has been reviewed, patient examined, no change in status, stable for surgery.  I have reviewed the patient's chart and labs.  Questions were answered to the patient's satisfaction.     Jerimyah Vandunk

## 2024-01-03 NOTE — Op Note (Signed)
 DILATION AND CURETTAGE OPERATIVE REPORT  DATE: 01/03/24   PRE-OP DIAGNOSIS: Retained placenta after vaginal delivery  POST-OP DIAGNOSIS: Same  PROCEDURE: D&C  SURGEON: Dr. Sofia Dunn  ANESTHESIA: General  IVF:  EBL: <38mL  UOP: 0mL  COMPLICATIONS: None  SPECIMEN: Products of conception  FINDINGS: 12-week sized anteverted uterus with cervix 0.5cm dilated; small amounts of products of conception  DRAINS: None  CONDITION: Stable to recovery room  PROCEDURE: The risks, benefits, alternatives and indications of the procedure were discussed with the patient. She voiced an understanding of the procedure and informed consent was obtained.  The patient was taken to the OR where general anesthesia was administered without difficulty. She was placed in the dorsal lithotomy position using the Allen stirrups. And exam under anesthesia revealed a 12-week sized anteverted uterus with the cervix 0.5cm dilated. Blood clots were noted in the vagina. The patient was then prepped and draped in the normal sterile fashion.   A weighted speculum was inserted in the posterior aspect of the vagina. A single-tooth tenaculum was used to grasp the anterior lip of the cervix. The uterus was carefully sounded to 11.5cm. A 10mm suction curette was advanced to the uterine fundus. The suction was them started. The products of conception were evacuated with the curette in a rotation motion on the outward movement. A gentle sharp curettage was then performed with a serrated curette with a gritty texture felt in all four quadrants. The suction curette was then reintroduced to clear the uterus. The tenaculum was removed from the cervix, silver  nitrate x1 and pressure were used and good hemostasis was noted.   The patient tolerated the procedure well. She was taken to the recovery room in stable condition.    Sofia Dunn, DO [facility]

## 2024-01-03 NOTE — Progress Notes (Signed)
 Orders for D&C for retained POCs

## 2024-01-03 NOTE — H&P (View-Only) (Signed)
 Orders for D&C for retained POCs

## 2024-01-03 NOTE — Anesthesia Preprocedure Evaluation (Signed)
 Anesthesia Evaluation  Patient identified by MRN, date of birth, ID band Patient awake    Reviewed: Allergy & Precautions, NPO status , Patient's Chart, lab work & pertinent test results  History of Anesthesia Complications Negative for: history of anesthetic complications  Airway Mallampati: III  TM Distance: >3 FB Neck ROM: full    Dental  (+) Chipped   Pulmonary neg shortness of breath, former smoker   Pulmonary exam normal        Cardiovascular Exercise Tolerance: Good (-) angina (-) Past MI negative cardio ROS Normal cardiovascular exam     Neuro/Psych  PSYCHIATRIC DISORDERS      negative neurological ROS     GI/Hepatic negative GI ROS, Neg liver ROS,neg GERD  ,,  Endo/Other  negative endocrine ROS    Renal/GU      Musculoskeletal   Abdominal   Peds  Hematology negative hematology ROS (+)   Anesthesia Other Findings Past Medical History: 07/09/2018: Anxiety 11/26/2023: Encounter for induction of labor 09/07/2019: MDD (major depressive disorder) No date: Ovarian cyst No date: PCOS (polycystic ovarian syndrome)  Past Surgical History: 02/06/2021: INTRAUTERINE DEVICE (IUD) INSERTION; N/A     Comment:  Procedure: INTRAUTERINE DEVICE (IUD) INSERTION;                Surgeon: Teresa Fender, MD;  Location: [facility] ORS;                Service: Gynecology;  Laterality: N/A; 03/12/2022: IUD REMOVAL; N/A     Comment:  Procedure: INTRAUTERINE DEVICE (IUD) REMOVAL;  Surgeon:               Teresa Fender, MD;  Location: [facility] ORS;  Service:               Gynecology;  Laterality: N/A; 02/06/2021: LAPAROSCOPIC OVARIAN CYSTECTOMY; Right     Comment:  Procedure: LAPAROSCOPIC OVARIAN CYSTECTOMY;  Surgeon:               Teresa Fender, MD;  Location: [facility] ORS;  Service:               Gynecology;  Laterality: Right; No date: WISDOM TOOTH EXTRACTION     Comment:  four; age 11     Reproductive/Obstetrics negative OB  ROS                             Anesthesia Physical Anesthesia Plan  ASA: 3  Anesthesia Plan: General LMA   Post-op Pain Management:    Induction: Intravenous  PONV Risk Score and Plan: Dexamethasone , Ondansetron , Midazolam  and Treatment may vary due to age or medical condition  Airway Management Planned: LMA  Additional Equipment:   Intra-op Plan:   Post-operative Plan: Extubation in OR  Informed Consent: I have reviewed the patients History and Physical, chart, labs and discussed the procedure including the risks, benefits and alternatives for the proposed anesthesia with the patient or authorized representative who has indicated his/her understanding and acceptance.     Dental Advisory Given  Plan Discussed with: Anesthesiologist, CRNA and Surgeon  Anesthesia Plan Comments: (Patient consented for risks of anesthesia including but not limited to:  - adverse reactions to medications - damage to eyes, teeth, lips or other oral mucosa - nerve damage due to positioning  - sore throat or hoarseness - Damage to heart, brain, nerves, lungs, other parts of body or loss of life  Patient voiced understanding and assent.)  Anesthesia Quick Evaluation

## 2024-01-03 NOTE — Transfer of Care (Signed)
 Immediate Anesthesia Transfer of Care Note  Patient: Sandra Marsh  Procedure(s) Performed: DILATION AND EVACUATION, UTERUS (Uterus)  Patient Location: PACU  Anesthesia Type:General  Level of Consciousness: sedated and drowsy  Airway & Oxygen Therapy: Patient Spontanous Breathing and Patient connected to face mask oxygen  Post-op Assessment: Report given to RN and Post -op Vital signs reviewed and stable  Post vital signs: Reviewed and stable  Last Vitals:  Vitals Value Taken Time  BP 128/76 01/03/24 1516  Temp    Pulse 74 01/03/24 1516  Resp 11 01/03/24 1516  SpO2 100 % 01/03/24 1516    Last Pain:  Vitals:   01/03/24 1406  TempSrc: Temporal  PainSc: 0-No pain         Complications: No notable events documented.

## 2024-01-03 NOTE — Anesthesia Procedure Notes (Signed)
 Procedure Name: LMA Insertion Date/Time: 01/03/2024 2:32 PM  Performed by: Angelia Kelp, CRNAPre-anesthesia Checklist: Patient identified, Emergency Drugs available, Suction available, Patient being monitored and Timeout performed Patient Re-evaluated:Patient Re-evaluated prior to induction Oxygen Delivery Method: Circle system utilized Preoxygenation: Pre-oxygenation with 100% oxygen Induction Type: IV induction LMA: LMA inserted LMA Size: 4.0 Tube type: Oral Number of attempts: 1 Placement Confirmation: positive ETCO2 and breath sounds checked- equal and bilateral Tube secured with: Tape Dental Injury: Teeth and Oropharynx as per pre-operative assessment

## 2024-01-03 NOTE — Telephone Encounter (Signed)
 Spoke with pt via phone, discussed CBC showed hemodynamically stable, US  showing retained POCs. Pt states bleeding overnight lightened. Recommend D&C today for retained POCs, pt has been NPO for the past 12hrs and is able to arrive at [facility] at 1p. Scheduled with OR, orders signed.

## 2024-01-05 NOTE — Anesthesia Postprocedure Evaluation (Signed)
 Anesthesia Post Note  Patient: Sandra Marsh  Procedure(s) Performed: DILATION AND EVACUATION, UTERUS (Uterus)  Patient location during evaluation: PACU Anesthesia Type: General Level of consciousness: awake and alert Pain management: pain level controlled Vital Signs Assessment: post-procedure vital signs reviewed and stable Respiratory status: spontaneous breathing, nonlabored ventilation, respiratory function stable and patient connected to nasal cannula oxygen Cardiovascular status: blood pressure returned to baseline and stable Postop Assessment: no apparent nausea or vomiting Anesthetic complications: no   No notable events documented.   Last Vitals:  Vitals:   01/03/24 1545 01/03/24 1625  BP: 134/85 126/88  Pulse: 86 86  Resp: 12 18  Temp:    SpO2: 96% 99%    Last Pain:  Vitals:   01/03/24 1625  TempSrc:   PainSc: 0-No pain                 Portia Brittle Margeaux Swantek

## 2024-01-06 ENCOUNTER — Encounter: Payer: Self-pay | Admitting: Obstetrics

## 2024-01-06 LAB — SURGICAL PATHOLOGY

## 2024-01-08 ENCOUNTER — Ambulatory Visit (INDEPENDENT_AMBULATORY_CARE_PROVIDER_SITE_OTHER): Admitting: Certified Nurse Midwife

## 2024-01-08 DIAGNOSIS — N393 Stress incontinence (female) (male): Secondary | ICD-10-CM

## 2024-01-08 DIAGNOSIS — Z3202 Encounter for pregnancy test, result negative: Secondary | ICD-10-CM | POA: Diagnosis not present

## 2024-01-08 DIAGNOSIS — Z3009 Encounter for other general counseling and advice on contraception: Secondary | ICD-10-CM

## 2024-01-08 DIAGNOSIS — Z1332 Encounter for screening for maternal depression: Secondary | ICD-10-CM

## 2024-01-08 DIAGNOSIS — F419 Anxiety disorder, unspecified: Secondary | ICD-10-CM

## 2024-01-08 DIAGNOSIS — N949 Unspecified condition associated with female genital organs and menstrual cycle: Secondary | ICD-10-CM

## 2024-01-08 LAB — POCT URINE PREGNANCY: Preg Test, Ur: NEGATIVE

## 2024-01-08 MED ORDER — NORETHINDRONE 0.35 MG PO TABS: 1.0000 | ORAL_TABLET | Freq: Every day | ORAL | 3 refills | Status: AC

## 2024-01-08 NOTE — Assessment & Plan Note (Signed)
 Reports some struggles in the initial PP period, but worked with therapist and Psychiatrist,and restarted meds and is now stable and doing well on 100mg  Zoloft Q day.  EPDS: 3

## 2024-01-08 NOTE — Assessment & Plan Note (Addendum)
 D&C 5/2:  Slight bleeding remains today, lighter than period. Speculum exam shows closed cervix with small amount of dark bloody discharge present in Os and vaginal vault. No abnormal tissue visualized. No CMT reported on bimanual exam.

## 2024-01-08 NOTE — Progress Notes (Signed)
 Comprehensive Postpartum Visit Note  Subjective:   Sandra Marsh is an 28 y.o. who presents today for routine postpartum visit. She is 6 week postpartum following a spontaneous vaginal delivery. I have fully reviewed the prenatal and intrapartum course. The delivery was at 39.4 gestational weeks.  Anesthesia: epidural. Postpartum course has been uncomplicated. Patient did have some struggles with mood initially but reports stable mood today.   Birth:  feels good about the birth experience. Laceration: none Bleeding: thin lochia. D/C performed 5/2 for RPOC.  Bowel function is normal. Bladder function is normal. Occasional urine leakage.  Patient is not sexually active. Contraception method is oral progesterone -only contraceptive ordered today.  Postpartum depression screening: 3 Infant Feeding: Breast  Review of Systems Constitutional: Sleeping interrupted at night due to infant care. Appetite is good. No malaise, no fever, no headaches  EPDS Score:  3 Breasts: no nipple breakdown, no pain, lumps or swelling. Milk supply is sufficient.  Chest: no chest pain, no shortness of breath  Abdomen: no abdominal pain, no nausea or vomiting, no constipation, no incontinence of flatus or stool.   GU: lochia stopped. no discharge or vaginal irritation, occasional, mild incontinence of urine when bladder is full, no pain with urination. Has not yet resumed intercourse.     Objective:   BP 118/82   Pulse 98   Wt 96.3 kg   LMP 12/31/2023   Breastfeeding Yes   BMI 41.44 kg/m   Allergies:  Allergies  Allergen Reactions   Nickel Rash     Medications:   Current Outpatient Medications:    ibuprofen  (ADVIL ) 800 MG tablet, Take 1 tablet (800 mg total) by mouth every 8 (eight) hours as needed for cramping., Disp: 30 tablet, Rfl: 0   lamoTRIgine  (LAMICTAL ) 200 MG tablet, Take 200 mg by mouth in the morning., Disp: , Rfl:    lamoTRIgine  (LAMICTAL ) 25 MG tablet, Take 50 mg by mouth at  bedtime., Disp: , Rfl:    norethindrone (ORTHO MICRONOR) 0.35 MG tablet, Take 1 tablet (0.35 mg total) by mouth daily., Disp: 90 tablet, Rfl: 3   oxyCODONE -acetaminophen  (PERCOCET) 5-325 MG tablet, Take 1 tablet by mouth every 8 (eight) hours as needed for severe pain (pain score 7-10)., Disp: 3 tablet, Rfl: 0   Prenatal Vit-Fe Fumarate-FA (MULTIVITAMIN-PRENATAL) 27-0.8 MG TABS tablet, Take 1 tablet by mouth daily at 12 noon., Disp: , Rfl:    ZOLOFT 100 MG tablet, , Disp: , Rfl:    OB History  Gravida Para Term Preterm AB Living  1 1 1  0 0 1  SAB IAB Ectopic Multiple Live Births  0 0 0 0 1    # Outcome Date GA Lbr Len/2nd Weight Sex Type Anes PTL Lv  1 Term 11/27/23 [redacted]w[redacted]d 09:43 / 00:32 2980 g F Vag-Spont EPI  LIV    Past Medical History:  Diagnosis Date   Anxiety 07/09/2018   Encounter for induction of labor 11/26/2023   MDD (major depressive disorder) 09/07/2019   Ovarian cyst    PCOS (polycystic ovarian syndrome)     Past Surgical History:  Procedure Laterality Date   DILATION AND EVACUATION N/A 01/03/2024   Procedure: DILATION AND EVACUATION, UTERUS;  Surgeon: Sofia Dunn, MD;  Location: [facility];  Service: Gynecology;  Laterality: N/A;   INTRAUTERINE DEVICE (IUD) INSERTION N/A 02/06/2021   Procedure: INTRAUTERINE DEVICE (IUD) INSERTION;  Surgeon: Teresa Fender, MD;  Location: [facility];  Service: Gynecology;  Laterality: N/A;   IUD REMOVAL N/A 03/12/2022  Procedure: INTRAUTERINE DEVICE (IUD) REMOVAL;  Surgeon: Teresa Fender, MD;  Location: [facility];  Service: Gynecology;  Laterality: N/A;   LAPAROSCOPIC OVARIAN CYSTECTOMY Right 02/06/2021   Procedure: LAPAROSCOPIC OVARIAN CYSTECTOMY;  Surgeon: Teresa Fender, MD;  Location: [facility];  Service: Gynecology;  Laterality: Right;   WISDOM TOOTH EXTRACTION     four; age 45    Family History  Problem Relation Age of Onset   Ovarian cancer Mother 69   Healthy Father    Healthy Sister    Healthy Sister    Heart Problems Maternal  Grandmother    COPD Maternal Grandmother    Healthy Maternal Grandfather    Hypertension Paternal Grandmother    Healthy Paternal Grandfather     Social History   Socioeconomic History   Marital status: Married    Spouse name: Royston Cornea   Number of children: 0   Years of education: 16   Highest education level: Not on file  Occupational History   Occupation: Theatre manager  Tobacco Use   Smoking status: Former    Types: Cigarettes   Smokeless tobacco: Never  Vaping Use   Vaping status: Former   Quit date: 08/14/2022   Substances: CBD, Flavoring  Substance and Sexual Activity   Alcohol use: Never   Drug use: Not Currently    Types: Marijuana   Sexual activity: Yes    Birth control/protection: Pill  Other Topics Concern   Not on file  Social History Narrative   Not on file   Social Drivers of Health   Financial Resource Strain: Low Risk  (04/22/2023)   Overall Financial Resource Strain (CARDIA)    Difficulty of Paying Living Expenses: Not hard at all  Food Insecurity: Food Insecurity Present (11/26/2023)   Hunger Vital Sign    Worried About Running Out of Food in the Last Year: Sometimes true    Ran Out of Food in the Last Year: Never true  Transportation Needs: No Transportation Needs (11/26/2023)   PRAPARE - Administrator, Civil Service (Medical): No    Lack of Transportation (Non-Medical): No  Physical Activity: Inactive (04/22/2023)   Exercise Vital Sign    Days of Exercise per Week: 0 days    Minutes of Exercise per Session: 0 min  Stress: No Stress Concern Present (04/22/2023)   [employer] of Occupational Health - Occupational Stress Questionnaire    Feeling of Stress : Not at all  Social Connections: Unknown (04/22/2023)   Social Connection and Isolation Panel [NHANES]    Frequency of Communication with Friends and Family: Three times a week    Frequency of Social Gatherings with Friends and Family: Once a week    Attends Religious  Services: Never    Database administrator or Organizations: No    Attends Banker Meetings: Never    Marital Status: Not on file    Immunization History  Administered Date(s) Administered   HPV 9-valent 04/03/2006, 08/08/2006, 10/10/2007   Influenza, Seasonal, Injecte, Preservative Fre 06/18/2023   Influenza-Unspecified 08/02/2017   Pfizer Covid-19 Vaccine Bivalent Booster 6yrs & up 10/30/2019, 11/20/2019, 07/23/2020, 07/17/2021, 07/18/2022   Tdap 09/07/2019, 09/10/2023     PE:  Neck: no lymphadenopathy, no thyroid  enlargement or nodules Breasts: soft, nt without masses, nodules, no lymphadenopathy Heart: S1S2 without murmur, rub or gallop Lung: CTA without wheezing, cough or sob Abdomen: soft, nontender.  GU: Pelvic exam: normal external genitalia, vulva, vagina, cervix, uterus and adnexa, CERVIX: normal appearing cervix with  small amount of dark bloody discharge, no lesions, UTERUS: uterus is normal shape, consistency and nontender, enlarged to 6 week's size.      ASSESSMENT/PLAN    Retained portions of placenta D&C 5/2:  Slight bleeding remains today, lighter than period. Speculum exam shows closed cervix with small amount of dark bloody discharge present in Os and vaginal vault. No abnormal tissue visualized. No CMT reported on bimanual exam.   Anxiety and depression Reports some struggles in the initial PP period, but worked with therapist and Psychiatrist,and restarted meds and is now stable and doing well on 100mg  Zoloft Q day.  EPDS: 3    Postpartum care and examination G1P1 SVD at 39.4 wks. Reports doing well and enjoying time home with baby girl. Kashae reports good support from partner and coping well with newborn transitions. She does report some incontinence when bladder is full and hear and fells a "popping" around pubic symphisis with movement. Referral for pelvic floor PT placed. BC options discussed and patient desires to start POP at this time as to  not interfere with breast milk production. Plan to change to prior COC in future.   Last Pap 05/2023- NILM, repeat 2027.  No follow-ups on file.     [place] Cheral Cappucci, Student-MidWife  05/07/253:11 PM

## 2024-01-08 NOTE — Assessment & Plan Note (Signed)
 G1P1 SVD at 39.4 wks. Reports doing well and enjoying time home with baby girl. Jodel reports good support from partner and coping well with newborn transitions. She does report some incontinence when bladder is full and hear and fells a "popping" around pubic symphisis with movement. Referral for pelvic floor PT placed. BC options discussed and patient desires to start POP at this time as to not interfere with breast milk production. Plan to change to prior COC in future.

## 2024-01-09 ENCOUNTER — Encounter: Payer: Self-pay | Admitting: Certified Nurse Midwife

## 2024-01-09 DIAGNOSIS — F902 Attention-deficit hyperactivity disorder, combined type: Secondary | ICD-10-CM | POA: Diagnosis not present

## 2024-01-09 DIAGNOSIS — F1211 Cannabis abuse, in remission: Secondary | ICD-10-CM | POA: Diagnosis not present

## 2024-01-09 DIAGNOSIS — F39 Unspecified mood [affective] disorder: Secondary | ICD-10-CM | POA: Diagnosis not present

## 2024-01-09 DIAGNOSIS — F411 Generalized anxiety disorder: Secondary | ICD-10-CM | POA: Diagnosis not present

## 2024-01-09 NOTE — Addendum Note (Signed)
 Addended by: Quince Bryant on: 01/09/2024 11:47 AM   Modules accepted: Orders

## 2024-02-18 DIAGNOSIS — F39 Unspecified mood [affective] disorder: Secondary | ICD-10-CM | POA: Diagnosis not present

## 2024-02-18 DIAGNOSIS — F411 Generalized anxiety disorder: Secondary | ICD-10-CM | POA: Diagnosis not present

## 2024-02-18 DIAGNOSIS — F902 Attention-deficit hyperactivity disorder, combined type: Secondary | ICD-10-CM | POA: Diagnosis not present

## 2024-02-18 DIAGNOSIS — F1211 Cannabis abuse, in remission: Secondary | ICD-10-CM | POA: Diagnosis not present

## 2024-02-26 DIAGNOSIS — F603 Borderline personality disorder: Secondary | ICD-10-CM | POA: Diagnosis not present

## 2024-03-11 DIAGNOSIS — F603 Borderline personality disorder: Secondary | ICD-10-CM | POA: Diagnosis not present

## 2024-04-01 DIAGNOSIS — F411 Generalized anxiety disorder: Secondary | ICD-10-CM | POA: Diagnosis not present

## 2024-04-01 DIAGNOSIS — F5105 Insomnia due to other mental disorder: Secondary | ICD-10-CM | POA: Diagnosis not present

## 2024-04-01 DIAGNOSIS — F39 Unspecified mood [affective] disorder: Secondary | ICD-10-CM | POA: Diagnosis not present

## 2024-04-01 DIAGNOSIS — F902 Attention-deficit hyperactivity disorder, combined type: Secondary | ICD-10-CM | POA: Diagnosis not present

## 2024-04-15 ENCOUNTER — Other Ambulatory Visit: Payer: Self-pay

## 2024-04-15 ENCOUNTER — Encounter: Payer: Self-pay | Admitting: Certified Nurse Midwife

## 2024-04-15 DIAGNOSIS — N393 Stress incontinence (female) (male): Secondary | ICD-10-CM

## 2024-04-24 ENCOUNTER — Encounter: Payer: Self-pay | Admitting: Cardiology

## 2024-04-24 ENCOUNTER — Ambulatory Visit (INDEPENDENT_AMBULATORY_CARE_PROVIDER_SITE_OTHER): Admitting: Cardiology

## 2024-04-24 VITALS — BP 126/82 | HR 98 | Ht 60.0 in | Wt 216.8 lb

## 2024-04-24 DIAGNOSIS — Z0130 Encounter for examination of blood pressure without abnormal findings: Secondary | ICD-10-CM

## 2024-04-24 DIAGNOSIS — R1031 Right lower quadrant pain: Secondary | ICD-10-CM | POA: Diagnosis not present

## 2024-04-24 NOTE — Progress Notes (Signed)
 New Patient Office Visit  Subjective   Patient ID: Sandra Marsh, female    DOB: 1996-03-07  Age: 28 y.o. MRN: [id number]  CC:  Chief Complaint  Patient presents with   Establish Care    New patient. Having pain on lower right abdomen started last Sunday starting to hurt when she walks, coughs, or gets up and down. Is starting to travel to the center on left side of her lower abdomen. Bloating and diarrhea/ loose stool. Decreased appetite.     HPI Venicia A Mentel presents to establish care Previous Primary Care provider/office:   she does have additional concerns to discuss today.   Patient in office to establish care. Patient complaining of a dull ache RLQ that radiates to the center lower abdomen, with a sharp pain that occurs 3-4 times a day. Also experiencing diarrhea, gas and nausea made worse when eating. Denies pain on palpation. Took Tylenol  with no relief. Will order an ultrasound to check appendix. Had right ovary removed previously.  Patient understands if pain worsens, she should go to the ED.   Abdominal Pain This is a new problem. The current episode started in the past 7 days. The onset quality is sudden. The problem has been gradually worsening. The pain is located in the RLQ. The quality of the pain is aching, sharp and dull. The abdominal pain radiates to the suprapubic region. Associated symptoms include diarrhea, flatus and nausea. Pertinent negatives include no belching, constipation, headaches, hematochezia, melena, myalgias or vomiting. The pain is aggravated by eating. The pain is relieved by Nothing. She has tried acetaminophen  for the symptoms. The treatment provided no relief. Her past medical history is significant for abdominal surgery.    Outpatient Encounter Medications as of 04/24/2024  Medication Sig   lamoTRIgine  (LAMICTAL ) 200 MG tablet Take 200 mg by mouth in the morning.   lamoTRIgine  (LAMICTAL ) 25 MG tablet Take 50 mg by mouth at bedtime.    norethindrone  (ORTHO MICRONOR ) 0.35 MG tablet Take 1 tablet (0.35 mg total) by mouth daily.   ZOLOFT 100 MG tablet    [DISCONTINUED] ibuprofen  (ADVIL ) 800 MG tablet Take 1 tablet (800 mg total) by mouth every 8 (eight) hours as needed for cramping. (Patient not taking: Reported on 04/24/2024)   [DISCONTINUED] Prenatal Vit-Fe Fumarate-FA (MULTIVITAMIN-PRENATAL) 27-0.8 MG TABS tablet Take 1 tablet by mouth daily at 12 noon. (Patient not taking: Reported on 04/24/2024)   No facility-administered encounter medications on file as of 04/24/2024.    Past Medical History:  Diagnosis Date   Anxiety 07/09/2018   Encounter for induction of labor 11/26/2023   MDD (major depressive disorder) 09/07/2019   Ovarian cyst    PCOS (polycystic ovarian syndrome)     Past Surgical History:  Procedure Laterality Date   DILATION AND EVACUATION N/A 01/03/2024   Procedure: DILATION AND EVACUATION, UTERUS;  Surgeon: Leigh Sober, MD;  Location: [facility];  Service: Gynecology;  Laterality: N/A;   INTRAUTERINE DEVICE (IUD) INSERTION N/A 02/06/2021   Procedure: INTRAUTERINE DEVICE (IUD) INSERTION;  Surgeon: Connell Davies, MD;  Location: [facility];  Service: Gynecology;  Laterality: N/A;   IUD REMOVAL N/A 03/12/2022   Procedure: INTRAUTERINE DEVICE (IUD) REMOVAL;  Surgeon: Connell Davies, MD;  Location: [facility];  Service: Gynecology;  Laterality: N/A;   LAPAROSCOPIC OVARIAN CYSTECTOMY Right 02/06/2021   Procedure: LAPAROSCOPIC OVARIAN CYSTECTOMY;  Surgeon: Connell Davies, MD;  Location: [facility];  Service: Gynecology;  Laterality: Right;   WISDOM TOOTH EXTRACTION  four; age 36    Family History  Problem Relation Age of Onset   Ovarian cancer Mother 63   Healthy Father    Healthy Sister    Healthy Sister    Heart Problems Maternal Grandmother    COPD Maternal Grandmother    Healthy Maternal Grandfather    Hypertension Paternal Grandmother    Healthy Paternal Grandfather     Social History   Socioeconomic  History   Marital status: Married    Spouse name: Lynwood   Number of children: 0   Years of education: 16   Highest education level: Not on file  Occupational History   Occupation: Theatre manager  Tobacco Use   Smoking status: Former    Types: Cigarettes   Smokeless tobacco: Never  Vaping Use   Vaping status: Former   Quit date: 08/14/2022   Substances: CBD, Flavoring  Substance and Sexual Activity   Alcohol use: Never   Drug use: Not Currently    Types: Marijuana   Sexual activity: Yes    Birth control/protection: Pill  Other Topics Concern   Not on file  Social History Narrative   Not on file   Social Drivers of Health   Financial Resource Strain: Low Risk  (04/22/2023)   Overall Financial Resource Strain (CARDIA)    Difficulty of Paying Living Expenses: Not hard at all  Food Insecurity: Food Insecurity Present (11/26/2023)   Hunger Vital Sign    Worried About Running Out of Food in the Last Year: Sometimes true    Ran Out of Food in the Last Year: Never true  Transportation Needs: No Transportation Needs (11/26/2023)   PRAPARE - Administrator, Civil Service (Medical): No    Lack of Transportation (Non-Medical): No  Physical Activity: Inactive (04/22/2023)   Exercise Vital Sign    Days of Exercise per Week: 0 days    Minutes of Exercise per Session: 0 min  Stress: No Stress Concern Present (04/22/2023)   [employer] of Occupational Health - Occupational Stress Questionnaire    Feeling of Stress : Not at all  Social Connections: Unknown (04/22/2023)   Social Connection and Isolation Panel    Frequency of Communication with Friends and Family: Three times a week    Frequency of Social Gatherings with Friends and Family: Once a week    Attends Religious Services: Never    Database administrator or Organizations: No    Attends Banker Meetings: Never    Marital Status: Not on file  Intimate Partner Violence: Not At Risk (11/26/2023)    Humiliation, Afraid, Rape, and Kick questionnaire    Fear of Current or Ex-Partner: No    Emotionally Abused: No    Physically Abused: No    Sexually Abused: No    Review of Systems  Constitutional: Negative.   HENT: Negative.    Eyes: Negative.   Respiratory: Negative.  Negative for shortness of breath.   Cardiovascular: Negative.  Negative for chest pain.  Gastrointestinal:  Positive for abdominal pain, diarrhea, flatus and nausea. Negative for blood in stool, constipation, heartburn, hematochezia, melena and vomiting.  Genitourinary: Negative.   Musculoskeletal:  Negative for joint pain and myalgias.  Skin: Negative.   Neurological: Negative.  Negative for dizziness and headaches.  Endo/Heme/Allergies: Negative.   All other systems reviewed and are negative.       Objective   BP 126/82   Pulse 98   Ht 5' (1.524 m)  Wt 216 lb 12.8 oz (98.3 kg)   SpO2 97%   BMI 42.34 kg/m   Physical Exam Vitals and nursing note reviewed.  Constitutional:      Appearance: Normal appearance. She is normal weight.  HENT:     Head: Normocephalic and atraumatic.     Nose: Nose normal.     Mouth/Throat:     Mouth: Mucous membranes are moist.  Eyes:     Extraocular Movements: Extraocular movements intact.     Conjunctiva/sclera: Conjunctivae normal.     Pupils: Pupils are equal, round, and reactive to light.  Cardiovascular:     Rate and Rhythm: Normal rate and regular rhythm.     Pulses: Normal pulses.     Heart sounds: Normal heart sounds.  Pulmonary:     Effort: Pulmonary effort is normal.     Breath sounds: Normal breath sounds.  Abdominal:     General: Abdomen is flat. Bowel sounds are normal.     Palpations: Abdomen is soft.  Musculoskeletal:        General: Normal range of motion.     Cervical back: Normal range of motion.  Skin:    General: Skin is warm and dry.  Neurological:     General: No focal deficit present.     Mental Status: She is alert and oriented to  person, place, and time.  Psychiatric:        Mood and Affect: Mood normal.        Behavior: Behavior normal.        Thought Content: Thought content normal.        Judgment: Judgment normal.        Assessment & Plan:  Abdominal ultrasound  Problem List Items Addressed This Visit       Other   Right lower quadrant abdominal pain - Primary   Relevant Orders   US  Abdomen Complete    Return in about 2 weeks (around 05/08/2024) for after ultrasound.   Total time spent: 25 minutes  [employer], NP  04/24/2024   This document may have been prepared by Dragon Voice Recognition software and as such may include unintentional dictation errors.

## 2024-04-29 ENCOUNTER — Ambulatory Visit
Admission: RE | Admit: 2024-04-29 | Discharge: 2024-04-29 | Disposition: A | Discharge status: Home / Self Care | Source: Ambulatory Visit | Attending: Cardiology | Admitting: Cardiology

## 2024-04-29 ENCOUNTER — Other Ambulatory Visit: Payer: Self-pay | Admitting: Cardiology

## 2024-04-29 DIAGNOSIS — F603 Borderline personality disorder: Secondary | ICD-10-CM | POA: Diagnosis not present

## 2024-04-29 DIAGNOSIS — R1031 Right lower quadrant pain: Secondary | ICD-10-CM

## 2024-05-08 ENCOUNTER — Ambulatory Visit: Admitting: Cardiology

## 2024-05-13 DIAGNOSIS — F603 Borderline personality disorder: Secondary | ICD-10-CM | POA: Diagnosis not present

## 2024-06-08 ENCOUNTER — Ambulatory Visit (INDEPENDENT_AMBULATORY_CARE_PROVIDER_SITE_OTHER): Admitting: Cardiology

## 2024-06-08 ENCOUNTER — Encounter: Payer: Self-pay | Admitting: Cardiology

## 2024-06-08 VITALS — BP 136/104 | HR 100 | Ht 60.0 in | Wt 220.0 lb

## 2024-06-08 DIAGNOSIS — Z0130 Encounter for examination of blood pressure without abnormal findings: Secondary | ICD-10-CM

## 2024-06-08 DIAGNOSIS — M79676 Pain in unspecified toe(s): Secondary | ICD-10-CM | POA: Diagnosis not present

## 2024-06-08 NOTE — Progress Notes (Signed)
 Established Patient Office Visit  Subjective:  Patient ID: Sandra Marsh, female    DOB: [birth year]  Age: 28 y.o. MRN: [id number]  Chief Complaint  Patient presents with   Acute Visit    R Pinky Toe, possible broke    Patient in office for an acute visit, complaining of right pinky toe pain. Patient reports hitting her toe on a hard surface yesterday. Since then, she has had pain, and numbness in toe. Has tried Tylenol  with no relief.  Recommend going to [facility] for quicker evaluation and treatment of toe pain.   Toe Pain  The incident occurred 12 to 24 hours ago. The incident occurred at home. The injury mechanism was a direct blow. The pain is present in the right toes. The quality of the pain is described as aching. The pain is at a severity of 6/10. The pain is moderate. The pain has been Constant since onset. Associated symptoms include an inability to bear weight and numbness. She reports no foreign bodies present. The symptoms are aggravated by movement, palpation and weight bearing. She has tried acetaminophen  for the symptoms. The treatment provided no relief.    No other concerns at this time.   Past Medical History:  Diagnosis Date   Anxiety 07/09/2018   Encounter for induction of labor 11/26/2023   MDD (major depressive disorder) 09/07/2019   Ovarian cyst    PCOS (polycystic ovarian syndrome)     Past Surgical History:  Procedure Laterality Date   DILATION AND EVACUATION N/A 01/03/2024   Procedure: DILATION AND EVACUATION, UTERUS;  Surgeon: Leigh Sober, MD;  Location: [facility];  Service: Gynecology;  Laterality: N/A;   INTRAUTERINE DEVICE (IUD) INSERTION N/A 02/06/2021   Procedure: INTRAUTERINE DEVICE (IUD) INSERTION;  Surgeon: Connell Davies, MD;  Location: [facility];  Service: Gynecology;  Laterality: N/A;   IUD REMOVAL N/A 03/12/2022   Procedure: INTRAUTERINE DEVICE (IUD) REMOVAL;  Surgeon: Connell Davies, MD;  Location: [facility];  Service: Gynecology;   Laterality: N/A;   LAPAROSCOPIC OVARIAN CYSTECTOMY Right 02/06/2021   Procedure: LAPAROSCOPIC OVARIAN CYSTECTOMY;  Surgeon: Connell Davies, MD;  Location: [facility];  Service: Gynecology;  Laterality: Right;   WISDOM TOOTH EXTRACTION     four; age 56    Social History   Socioeconomic History   Marital status: Single    Spouse name: Lynwood   Number of children: 0   Years of education: 16   Highest education level: Not on file  Occupational History   Occupation: Theatre manager  Tobacco Use   Smoking status: Former    Types: Cigarettes   Smokeless tobacco: Never  Vaping Use   Vaping status: Former   Quit date: 08/14/2022   Substances: CBD, Flavoring  Substance and Sexual Activity   Alcohol use: Never   Drug use: Not Currently    Types: Marijuana   Sexual activity: Yes    Birth control/protection: Pill  Other Topics Concern   Not on file  Social History Narrative   Not on file   Social Drivers of Health   Financial Resource Strain: Low Risk  (04/22/2023)   Overall Financial Resource Strain (CARDIA)    Difficulty of Paying Living Expenses: Not hard at all  Food Insecurity: Food Insecurity Present (11/26/2023)   Hunger Vital Sign    Worried About Running Out of Food in the Last Year: Sometimes true    Ran Out of Food in the Last Year: Never true  Transportation Needs: No Transportation Needs (  11/26/2023)   PRAPARE - Administrator, Civil Service (Medical): No    Lack of Transportation (Non-Medical): No  Physical Activity: Inactive (04/22/2023)   Exercise Vital Sign    Days of Exercise per Week: 0 days    Minutes of Exercise per Session: 0 min  Stress: No Stress Concern Present (04/22/2023)   [employer] of Occupational Health - Occupational Stress Questionnaire    Feeling of Stress : Not at all  Social Connections: Unknown (04/22/2023)   Social Connection and Isolation Panel    Frequency of Communication with Friends and Family: Three times a week     Frequency of Social Gatherings with Friends and Family: Once a week    Attends Religious Services: Never    Database administrator or Organizations: No    Attends Banker Meetings: Never    Marital Status: Not on file  Intimate Partner Violence: Not At Risk (11/26/2023)   Humiliation, Afraid, Rape, and Kick questionnaire    Fear of Current or Ex-Partner: No    Emotionally Abused: No    Physically Abused: No    Sexually Abused: No    Family History  Problem Relation Age of Onset   Ovarian cancer Mother 40   Healthy Father    Healthy Sister    Healthy Sister    Heart Problems Maternal Grandmother    COPD Maternal Grandmother    Healthy Maternal Grandfather    Hypertension Paternal Grandmother    Healthy Paternal Grandfather     Allergies  Allergen Reactions   Nickel Rash    Outpatient Medications Prior to Visit  Medication Sig   lamoTRIgine  (LAMICTAL ) 200 MG tablet Take 200 mg by mouth in the morning.   lamoTRIgine  (LAMICTAL ) 25 MG tablet Take 50 mg by mouth at bedtime.   norethindrone  (ORTHO MICRONOR ) 0.35 MG tablet Take 1 tablet (0.35 mg total) by mouth daily.   ZOLOFT 100 MG tablet    No facility-administered medications prior to visit.    Review of Systems  Constitutional: Negative.   HENT: Negative.    Eyes: Negative.   Respiratory: Negative.  Negative for shortness of breath.   Cardiovascular: Negative.  Negative for chest pain.  Gastrointestinal: Negative.  Negative for abdominal pain, constipation and diarrhea.  Genitourinary: Negative.   Musculoskeletal:  Positive for joint pain. Negative for myalgias.  Skin: Negative.   Neurological:  Positive for numbness. Negative for dizziness and headaches.  Endo/Heme/Allergies: Negative.   All other systems reviewed and are negative.      Objective:   BP (!) 136/104   Pulse 100   Ht 5' (1.524 m)   Wt 220 lb (99.8 kg)   SpO2 98%   BMI 42.97 kg/m   Vitals:   06/08/24 1029  BP: (!)  136/104  Pulse: 100  Height: 5' (1.524 m)  Weight: 220 lb (99.8 kg)  SpO2: 98%  BMI (Calculated): 42.97    Physical Exam Vitals and nursing note reviewed.  Constitutional:      Appearance: Normal appearance. She is normal weight.  HENT:     Head: Normocephalic and atraumatic.     Nose: Nose normal.     Mouth/Throat:     Mouth: Mucous membranes are moist.  Eyes:     Extraocular Movements: Extraocular movements intact.     Conjunctiva/sclera: Conjunctivae normal.     Pupils: Pupils are equal, round, and reactive to light.  Cardiovascular:     Rate and Rhythm: Normal rate  and regular rhythm.     Pulses: Normal pulses.     Heart sounds: Normal heart sounds.  Pulmonary:     Effort: Pulmonary effort is normal.     Breath sounds: Normal breath sounds.  Abdominal:     General: Abdomen is flat. Bowel sounds are normal.     Palpations: Abdomen is soft.  Musculoskeletal:        General: Normal range of motion.     Cervical back: Normal range of motion.  Skin:    General: Skin is warm and dry.  Neurological:     General: No focal deficit present.     Mental Status: She is alert and oriented to person, place, and time.  Psychiatric:        Mood and Affect: Mood normal.        Behavior: Behavior normal.        Thought Content: Thought content normal.        Judgment: Judgment normal.      No results found for any visits on 06/08/24.  No results found for this or any previous visit (from the past 2160 hours).    Assessment & Plan:  Recommend going to [facility] for evaluation and treatment.   Problem List Items Addressed This Visit       Other   Pain of fifth toe - Primary    Return if symptoms worsen or fail to improve, for with [facility].   Total time spent: 25 minutes  [employer], NP  06/08/2024   This document may have been prepared by Dragon Voice Recognition software and as such may include unintentional dictation errors.

## 2024-06-25 DIAGNOSIS — F411 Generalized anxiety disorder: Secondary | ICD-10-CM | POA: Diagnosis not present

## 2024-06-25 DIAGNOSIS — F39 Unspecified mood [affective] disorder: Secondary | ICD-10-CM | POA: Diagnosis not present

## 2024-06-25 DIAGNOSIS — F902 Attention-deficit hyperactivity disorder, combined type: Secondary | ICD-10-CM | POA: Diagnosis not present

## 2024-06-25 DIAGNOSIS — F5105 Insomnia due to other mental disorder: Secondary | ICD-10-CM | POA: Diagnosis not present

## 2024-07-18 DIAGNOSIS — F39 Unspecified mood [affective] disorder: Secondary | ICD-10-CM | POA: Diagnosis not present

## 2024-07-18 DIAGNOSIS — F902 Attention-deficit hyperactivity disorder, combined type: Secondary | ICD-10-CM | POA: Diagnosis not present

## 2024-07-18 DIAGNOSIS — F411 Generalized anxiety disorder: Secondary | ICD-10-CM | POA: Diagnosis not present

## 2024-07-18 DIAGNOSIS — F5105 Insomnia due to other mental disorder: Secondary | ICD-10-CM | POA: Diagnosis not present

## 2024-07-24 ENCOUNTER — Ambulatory Visit: Admitting: Cardiology

## 2024-07-24 ENCOUNTER — Encounter: Payer: Self-pay | Admitting: Cardiology

## 2024-07-24 VITALS — BP 128/62 | HR 105 | Ht 60.0 in | Wt 216.6 lb

## 2024-07-24 DIAGNOSIS — H60391 Other infective otitis externa, right ear: Secondary | ICD-10-CM | POA: Diagnosis not present

## 2024-07-24 DIAGNOSIS — Z0130 Encounter for examination of blood pressure without abnormal findings: Secondary | ICD-10-CM

## 2024-07-24 DIAGNOSIS — F332 Major depressive disorder, recurrent severe without psychotic features: Secondary | ICD-10-CM | POA: Diagnosis not present

## 2024-07-24 DIAGNOSIS — H6090 Unspecified otitis externa, unspecified ear: Secondary | ICD-10-CM | POA: Insufficient documentation

## 2024-07-24 MED ORDER — CIPRO HC 0.2-1 % OT SUSP: 4.0000 [drp] | Freq: Two times a day (BID) | OTIC | 0 refills | Status: AC

## 2024-07-24 NOTE — Progress Notes (Signed)
 Established Patient Office Visit  Subjective:  Patient ID: Sandra Marsh, female    DOB: October 09, 1995  Age: 28 y.o. MRN: [id number]  Chief Complaint  Patient presents with   Follow-up    Right ear concerns    Patient in office for an acute visit, complaining of right ear pain, muffled hearing. Started last Saturday, notice blood on the outside of her ear canal on Wednesday. Denies runny nose, cough, sore throat. On exam, ear drum appears normal. Ear canal red and irritated. Will send in Cipro  otic. Recommend not using cotton swabs or ear buds for a few days to allow the canal to heal.   Otalgia  There is pain in the right ear. This is a new problem. The current episode started in the past 7 days. The problem occurs constantly. The problem has been unchanged. There has been no fever. The pain is mild. Pertinent negatives include no abdominal pain, coughing, diarrhea, ear discharge, headaches, hearing loss, rhinorrhea or sore throat. She has tried nothing for the symptoms. The treatment provided no relief.    No other concerns at this time.   Past Medical History:  Diagnosis Date   Anxiety 07/09/2018   Encounter for induction of labor 11/26/2023   MDD (major depressive disorder) 09/07/2019   Ovarian cyst    PCOS (polycystic ovarian syndrome)    Single umbilical artery affecting management of mother in singleton pregnancy, antepartum 08/11/2023    Past Surgical History:  Procedure Laterality Date   DILATION AND EVACUATION N/A 01/03/2024   Procedure: DILATION AND EVACUATION, UTERUS;  Surgeon: Leigh Sober, MD;  Location: [facility];  Service: Gynecology;  Laterality: N/A;   INTRAUTERINE DEVICE (IUD) INSERTION N/A 02/06/2021   Procedure: INTRAUTERINE DEVICE (IUD) INSERTION;  Surgeon: Connell Davies, MD;  Location: [facility];  Service: Gynecology;  Laterality: N/A;   IUD REMOVAL N/A 03/12/2022   Procedure: INTRAUTERINE DEVICE (IUD) REMOVAL;  Surgeon: Connell Davies, MD;  Location: [facility];   Service: Gynecology;  Laterality: N/A;   LAPAROSCOPIC OVARIAN CYSTECTOMY Right 02/06/2021   Procedure: LAPAROSCOPIC OVARIAN CYSTECTOMY;  Surgeon: Connell Davies, MD;  Location: [facility];  Service: Gynecology;  Laterality: Right;   WISDOM TOOTH EXTRACTION     four; age 84    Social History   Socioeconomic History   Marital status: Single    Spouse name: Lynwood   Number of children: 0   Years of education: 16   Highest education level: Not on file  Occupational History   Occupation: theatre manager  Tobacco Use   Smoking status: Former    Types: Cigarettes   Smokeless tobacco: Never  Vaping Use   Vaping status: Former   Quit date: 08/14/2022   Substances: CBD, Flavoring  Substance and Sexual Activity   Alcohol use: Never   Drug use: Not Currently    Types: Marijuana   Sexual activity: Yes    Birth control/protection: Pill  Other Topics Concern   Not on file  Social History Narrative   Not on file   Social Drivers of Health   Financial Resource Strain: Low Risk  (04/22/2023)   Overall Financial Resource Strain (CARDIA)    Difficulty of Paying Living Expenses: Not hard at all  Food Insecurity: Food Insecurity Present (11/26/2023)   Hunger Vital Sign    Worried About Running Out of Food in the Last Year: Sometimes true    Ran Out of Food in the Last Year: Never true  Transportation Needs: No Transportation Needs (11/26/2023)  PRAPARE - Administrator, Civil Service (Medical): No    Lack of Transportation (Non-Medical): No  Physical Activity: Inactive (04/22/2023)   Exercise Vital Sign    Days of Exercise per Week: 0 days    Minutes of Exercise per Session: 0 min  Stress: No Stress Concern Present (04/22/2023)   [employer] of Occupational Health - Occupational Stress Questionnaire    Feeling of Stress : Not at all  Social Connections: Unknown (04/22/2023)   Social Connection and Isolation Panel    Frequency of Communication with Friends and  Family: Three times a week    Frequency of Social Gatherings with Friends and Family: Once a week    Attends Religious Services: Never    Database Administrator or Organizations: No    Attends Banker Meetings: Never    Marital Status: Not on file  Intimate Partner Violence: Not At Risk (11/26/2023)   Humiliation, Afraid, Rape, and Kick questionnaire    Fear of Current or Ex-Partner: No    Emotionally Abused: No    Physically Abused: No    Sexually Abused: No    Family History  Problem Relation Age of Onset   Ovarian cancer Mother 22   Healthy Father    Healthy Sister    Healthy Sister    Heart Problems Maternal Grandmother    COPD Maternal Grandmother    Healthy Maternal Grandfather    Hypertension Paternal Grandmother    Healthy Paternal Grandfather     Allergies  Allergen Reactions   Nickel Rash    Outpatient Medications Prior to Visit  Medication Sig   lamoTRIgine  (LAMICTAL ) 200 MG tablet Take 200 mg by mouth in the morning.   lamoTRIgine  (LAMICTAL ) 25 MG tablet Take 50 mg by mouth at bedtime.   norethindrone  (ORTHO MICRONOR ) 0.35 MG tablet Take 1 tablet (0.35 mg total) by mouth daily.   ZOLOFT 100 MG tablet    No facility-administered medications prior to visit.    Review of Systems  Constitutional: Negative.   HENT:  Positive for ear pain. Negative for congestion, ear discharge, hearing loss, rhinorrhea, sinus pain and sore throat.   Eyes: Negative.   Respiratory: Negative.  Negative for cough and shortness of breath.   Cardiovascular: Negative.  Negative for chest pain.  Gastrointestinal: Negative.  Negative for abdominal pain, constipation and diarrhea.  Genitourinary: Negative.   Musculoskeletal:  Negative for joint pain and myalgias.  Skin: Negative.   Neurological: Negative.  Negative for dizziness and headaches.  Endo/Heme/Allergies: Negative.   All other systems reviewed and are negative.      Objective:   BP 128/62   Pulse (!)  105   Ht 5' (1.524 m)   Wt 216 lb 9.6 oz (98.2 kg)   SpO2 99%   BMI 42.30 kg/m   Vitals:   07/24/24 1309  BP: 128/62  Pulse: (!) 105  Height: 5' (1.524 m)  Weight: 216 lb 9.6 oz (98.2 kg)  SpO2: 99%  BMI (Calculated): 42.3    Physical Exam Vitals and nursing note reviewed.  Constitutional:      Appearance: Normal appearance. She is normal weight.  HENT:     Head: Normocephalic and atraumatic.     Right Ear: Tympanic membrane normal. Tenderness present.     Left Ear: Hearing, tympanic membrane, ear canal and external ear normal.     Ears:     Comments: Erythema right ear canal    Nose: Nose normal.  Mouth/Throat:     Mouth: Mucous membranes are moist.  Eyes:     Extraocular Movements: Extraocular movements intact.     Conjunctiva/sclera: Conjunctivae normal.     Pupils: Pupils are equal, round, and reactive to light.  Cardiovascular:     Rate and Rhythm: Normal rate and regular rhythm.     Pulses: Normal pulses.     Heart sounds: Normal heart sounds.  Pulmonary:     Effort: Pulmonary effort is normal.     Breath sounds: Normal breath sounds.  Abdominal:     General: Abdomen is flat. Bowel sounds are normal.     Palpations: Abdomen is soft.  Musculoskeletal:        General: Normal range of motion.     Cervical back: Normal range of motion.  Skin:    General: Skin is warm and dry.  Neurological:     General: No focal deficit present.     Mental Status: She is alert and oriented to person, place, and time.  Psychiatric:        Mood and Affect: Mood normal.        Behavior: Behavior normal.        Thought Content: Thought content normal.        Judgment: Judgment normal.      No results found for any visits on 07/24/24.  No results found for this or any previous visit (from the past 2160 hours).    Assessment & Plan:  Cipro  otic  Problem List Items Addressed This Visit       Nervous and Auditory   Otitis externa - Primary     Other   MDD  (major depressive disorder), recurrent severe, without psychosis (HCC)    Return if symptoms worsen or fail to improve.   Total time spent: 25 minutes. This time includes review of previous notes and results and patient face to face interaction during today's visit.    Jeoffrey Pollen, NP  07/24/2024   This document may have been prepared by Dragon Voice Recognition software and as such may include unintentional dictation errors.

## 2024-08-12 DIAGNOSIS — J029 Acute pharyngitis, unspecified: Secondary | ICD-10-CM | POA: Diagnosis not present

## 2024-08-12 DIAGNOSIS — J00 Acute nasopharyngitis [common cold]: Secondary | ICD-10-CM | POA: Diagnosis not present

## 2024-08-12 DIAGNOSIS — Z1152 Encounter for screening for COVID-19: Secondary | ICD-10-CM | POA: Diagnosis not present

## 2024-08-12 DIAGNOSIS — Z6841 Body Mass Index (BMI) 40.0 and over, adult: Secondary | ICD-10-CM | POA: Diagnosis not present

## 2024-08-12 DIAGNOSIS — R051 Acute cough: Secondary | ICD-10-CM | POA: Diagnosis not present

## 2024-08-12 NOTE — Progress Notes (Signed)
 "  [facility] Visit Note    Sandra Marsh is a 28 y.o. female who presents with fever.   History obtained from patient.   Assessment & Plan:    Assessment & Plan Acute pharyngitis, unspecified etiology  Orders:   Strep Molecular POCT  Sore throat  Orders:   Influenza Molecular POCT   QUIDEL SOFIA-2 SARS-COV-2 ANTIGEN, FIA  Acute cough  Orders:   benzonatate  (TESSALON ) 100 MG capsule; Swallow whole one (100mg ) capsule by mouth 3 times a day  as needed.Do not break, chew, dissolve, cut or crush.  Acute nasopharyngitis (common cold)  Orders:   acetaminophen  (TYLENOL ) 500 MG tablet; Take 1-2 tablets (500-1,000 mg total) by mouth every 6 (six) hours as needed for pain (Max 6 tablets/24 hrs) for up to 7 days   ibuprofen  (ADVIL ) 200 MG tablet; Take 1 tablet (200 mg total) by mouth every 6 (six) hours as needed for moderate pain or fever for up to 7 days Max 2,400 mg/day.   guaiFENesin (MUCINEX) 600 mg Ta12; Take 600-1,200 mg by mouth 2 (two) times a day as needed (Cough) for up to 7 days Max dose 2,400mg  per day.   dextromethorphan (DELSYM) 30 mg/5 mL liquid; Take 10 mL (60 mg total) by mouth 2 (two) times a day as needed for cough for up to 7 days Do not exceed 2 doses per day.   Expect symptoms to last anywhere between 7-10 days Prompt follow up needed for chest pain, shortness of breath, persistent fever, nausea/vomiting/diarrhea or worsening of symptoms Tylenol / Ibuprofen  for myalgias/headaches, sore throat and/or fever  Delsym or Robitussin for cough  Mucinex and/ or Flonase  for nasal congestion/ multi-symptom relief  Daily multivitamin with Vitamin C/D and Zinc to help strengthen immune system  If no symptom improvement within 7-10 days, follow up with [facility] or PCP   Encounter for screening for COVID-19       No follow-ups on file.  Subjective     Fever Duration of current symptoms:  3 days Onset quality:  Suddenly Associated symptoms: cough (dry), nasal  congestion, decreased appetite, fatigue, fever, headache, runny nose, sore throat and sweats   Associated symptoms: no myalgias, no diarrhea, no nausea and no vomiting   Treatments tried:  Pain relievers and increased fluid intake (tylenol ) Response to treatment:  Relieved symptoms Relief: mild relief   Special considerations:  Exposure to illness    Allergies[1]  Review of Systems  Constitutional:  Positive for appetite change, diaphoresis, fatigue and fever.  HENT:  Positive for congestion, rhinorrhea and sore throat.   Respiratory:  Positive for cough (dry).   Cardiovascular: Negative.   Gastrointestinal: Negative.  Negative for diarrhea, nausea and vomiting.  Musculoskeletal: Negative.  Negative for myalgias.  Skin: Negative.   Neurological:  Positive for headaches.  All other systems reviewed and are negative.    Objective     Vital Signs: BP 127/85 (Site: Left arm, Position: Sitting, Cuff Size: Large adult)   Pulse 99   Temp 97.4 F (36.3 C) (Temporal)   Resp 18   Ht 5' (1.524 m)   Wt 210 lb (95.3 kg)   LMP 07/05/2024 (Approximate)   SpO2 98%   BMI 41.01 kg/m    Physical Exam Constitutional:      Appearance: Normal appearance.  HENT:     Head: Normocephalic.     Right Ear: Tympanic membrane, ear canal and external ear normal.     Left Ear: Tympanic membrane, ear canal and external ear  normal.     Nose: Nose normal. No congestion or rhinorrhea.     Mouth/Throat:     Mouth: Mucous membranes are moist.     Pharynx: Oropharynx is clear. Posterior oropharyngeal erythema present. No oropharyngeal exudate.  Eyes:     Conjunctiva/sclera: Conjunctivae normal.  Cardiovascular:     Rate and Rhythm: Normal rate and regular rhythm.     Pulses: Normal pulses.     Heart sounds: Normal heart sounds.  Pulmonary:     Effort: Pulmonary effort is normal.     Breath sounds: Normal breath sounds. No wheezing or rhonchi.  Chest:     Chest wall: No tenderness.  Abdominal:      Tenderness: There is no abdominal tenderness.  Lymphadenopathy:     Cervical: No cervical adenopathy.  Skin:    General: Skin is warm and dry.  Neurological:     Mental Status: She is alert and oriented to person, place, and time.  Psychiatric:        Mood and Affect: Mood normal.        Behavior: Behavior normal.                         [1] Allergies Allergen Reactions   Nickel Rash  "

## 2024-09-23 ENCOUNTER — Ambulatory Visit: Admitting: Cardiology

## 2024-09-23 ENCOUNTER — Encounter: Payer: Self-pay | Admitting: Cardiology

## 2024-09-23 VITALS — BP 112/84 | HR 110 | Ht 60.0 in | Wt 217.6 lb

## 2024-09-23 DIAGNOSIS — R051 Acute cough: Secondary | ICD-10-CM | POA: Diagnosis not present

## 2024-09-23 DIAGNOSIS — Z0130 Encounter for examination of blood pressure without abnormal findings: Secondary | ICD-10-CM

## 2024-09-23 DIAGNOSIS — J069 Acute upper respiratory infection, unspecified: Secondary | ICD-10-CM | POA: Insufficient documentation

## 2024-09-23 MED ORDER — AMOXICILLIN-POT CLAVULANATE 875-125 MG PO TABS: 1.0000 | ORAL_TABLET | Freq: Two times a day (BID) | ORAL | 0 refills | Status: AC

## 2024-09-23 MED ORDER — METHYLPREDNISOLONE 4 MG PO TBPK: ORAL_TABLET | ORAL | 0 refills | Status: AC

## 2024-09-23 NOTE — Progress Notes (Signed)
 "  Established Patient Office Visit  Subjective:  Patient ID: Sandra Marsh, female    DOB: [birth year]  Age: 29 y.o. MRN: [id number]  Chief Complaint  Patient presents with   Cough    Productive cough with green mucus x 3 weeks    Patient in office for an acute visit, complaining of a lingering productive cough with green mucous. Fever yesterday 101.3. Patient seen at urgent care 08/12/24, tested negative for Flu/Covid/Strep. Patient comes in today complaining of lingering cough, mucous, headache, runny nose, sore throat, PND and congestion. Patient has not treated the symptoms with any OTC medications. Will send in Augmentin , medrol  dose pack. Recommend using Mucinex to help clear mucous. Drink plenty of water.   Cough This is a new problem. The current episode started 1 to 4 weeks ago. The problem has been unchanged. The cough is Productive of purulent sputum. Associated symptoms include a fever, headaches, nasal congestion, postnasal drip, rhinorrhea and a sore throat. Pertinent negatives include no chest pain, ear congestion, ear pain, myalgias or shortness of breath. She has tried prescription cough suppressant for the symptoms. The treatment provided no relief.    No other concerns at this time.   Past Medical History:  Diagnosis Date   Anxiety 07/09/2018   Encounter for induction of labor 11/26/2023   MDD (major depressive disorder) 09/07/2019   Ovarian cyst    PCOS (polycystic ovarian syndrome)    Single umbilical artery affecting management of mother in singleton pregnancy, antepartum 08/11/2023    Past Surgical History:  Procedure Laterality Date   DILATION AND EVACUATION N/A 01/03/2024   Procedure: DILATION AND EVACUATION, UTERUS;  Surgeon: Leigh Sober, MD;  Location: [facility];  Service: Gynecology;  Laterality: N/A;   INTRAUTERINE DEVICE (IUD) INSERTION N/A 02/06/2021   Procedure: INTRAUTERINE DEVICE (IUD) INSERTION;  Surgeon: Connell Davies, MD;  Location: [facility];   Service: Gynecology;  Laterality: N/A;   IUD REMOVAL N/A 03/12/2022   Procedure: INTRAUTERINE DEVICE (IUD) REMOVAL;  Surgeon: Connell Davies, MD;  Location: [facility];  Service: Gynecology;  Laterality: N/A;   LAPAROSCOPIC OVARIAN CYSTECTOMY Right 02/06/2021   Procedure: LAPAROSCOPIC OVARIAN CYSTECTOMY;  Surgeon: Connell Davies, MD;  Location: [facility];  Service: Gynecology;  Laterality: Right;   WISDOM TOOTH EXTRACTION     four; age 61    Social History   Socioeconomic History   Marital status: Single    Spouse name: Lynwood   Number of children: 0   Years of education: 16   Highest education level: Not on file  Occupational History   Occupation: theatre manager  Tobacco Use   Smoking status: Former    Types: Cigarettes   Smokeless tobacco: Never  Vaping Use   Vaping status: Former   Quit date: 08/14/2022   Substances: CBD, Flavoring  Substance and Sexual Activity   Alcohol use: Never   Drug use: Not Currently    Types: Marijuana   Sexual activity: Yes    Birth control/protection: Pill  Other Topics Concern   Not on file  Social History Narrative   Not on file   Social Drivers of Health   Tobacco Use: Medium Risk (09/23/2024)   Patient History    Smoking Tobacco Use: Former    Smokeless Tobacco Use: Never    Passive Exposure: Not on file  Financial Resource Strain: Low Risk (04/22/2023)   Overall Financial Resource Strain (CARDIA)    Difficulty of Paying Living Expenses: Not hard at all  Food Insecurity:  Food Insecurity Present (11/26/2023)   Hunger Vital Sign    Worried About Running Out of Food in the Last Year: Sometimes true    Ran Out of Food in the Last Year: Never true  Transportation Needs: No Transportation Needs (11/26/2023)   PRAPARE - Administrator, Civil Service (Medical): No    Lack of Transportation (Non-Medical): No  Physical Activity: Inactive (04/22/2023)   Exercise Vital Sign    Days of Exercise per Week: 0 days    Minutes of  Exercise per Session: 0 min  Stress: No Stress Concern Present (04/22/2023)   [employer] of Occupational Health - Occupational Stress Questionnaire    Feeling of Stress : Not at all  Social Connections: Unknown (04/22/2023)   Social Connection and Isolation Panel    Frequency of Communication with Friends and Family: Three times a week    Frequency of Social Gatherings with Friends and Family: Once a week    Attends Religious Services: Never    Database Administrator or Organizations: No    Attends Banker Meetings: Never    Marital Status: Not on file  Intimate Partner Violence: Not At Risk (11/26/2023)   Humiliation, Afraid, Rape, and Kick questionnaire    Fear of Current or Ex-Partner: No    Emotionally Abused: No    Physically Abused: No    Sexually Abused: No  Depression (PHQ2-9): Medium Risk (04/24/2024)   Depression (PHQ2-9)    PHQ-2 Score: 6  Alcohol Screen: Not on file  Housing: Low Risk (11/26/2023)   Housing Stability Vital Sign    Unable to Pay for Housing in the Last Year: No    Number of Times Moved in the Last Year: 0    Homeless in the Last Year: No  Utilities: Not At Risk (11/26/2023)   [facility] Utilities    Threatened with loss of utilities: No  Health Literacy: Adequate Health Literacy (04/22/2023)   B1300 Health Literacy    Frequency of need for help with medical instructions: Never    Family History  Problem Relation Age of Onset   Ovarian cancer Mother 95   Healthy Father    Healthy Sister    Healthy Sister    Heart Problems Maternal Grandmother    COPD Maternal Grandmother    Healthy Maternal Grandfather    Hypertension Paternal Grandmother    Healthy Paternal Grandfather     Allergies[1]  Show/hide medication list[2]  Review of Systems  Constitutional:  Positive for fever.  HENT:  Positive for congestion, postnasal drip, rhinorrhea and sore throat. Negative for ear pain and sinus pain.   Eyes: Negative.   Respiratory:  Positive  for cough and sputum production. Negative for shortness of breath.   Cardiovascular: Negative.  Negative for chest pain.  Gastrointestinal: Negative.  Negative for abdominal pain, constipation and diarrhea.  Genitourinary: Negative.   Musculoskeletal:  Negative for joint pain and myalgias.  Skin: Negative.   Neurological:  Positive for headaches. Negative for dizziness.  Endo/Heme/Allergies: Negative.   All other systems reviewed and are negative.      Objective:   BP 112/84   Pulse (!) 110   Ht 5' (1.524 m)   Wt 217 lb 9.6 oz (98.7 kg)   SpO2 99%   BMI 42.50 kg/m   Vitals:   09/23/24 0852  BP: 112/84  Pulse: (!) 110  Height: 5' (1.524 m)  Weight: 217 lb 9.6 oz (98.7 kg)  SpO2: 99%  BMI (  Calculated): 42.5    Physical Exam Vitals and nursing note reviewed.  Constitutional:      Appearance: Normal appearance. She is normal weight.  HENT:     Head: Normocephalic and atraumatic.     Nose: Nose normal.     Mouth/Throat:     Mouth: Mucous membranes are moist.  Eyes:     Extraocular Movements: Extraocular movements intact.     Conjunctiva/sclera: Conjunctivae normal.     Pupils: Pupils are equal, round, and reactive to light.  Cardiovascular:     Rate and Rhythm: Normal rate and regular rhythm.     Pulses: Normal pulses.     Heart sounds: Normal heart sounds.  Pulmonary:     Effort: Pulmonary effort is normal.     Breath sounds: Normal breath sounds.  Abdominal:     General: Abdomen is flat. Bowel sounds are normal.     Palpations: Abdomen is soft.  Musculoskeletal:        General: Normal range of motion.     Cervical back: Normal range of motion.  Skin:    General: Skin is warm and dry.  Neurological:     General: No focal deficit present.     Mental Status: She is alert and oriented to person, place, and time.  Psychiatric:        Mood and Affect: Mood normal.        Behavior: Behavior normal.        Thought Content: Thought content normal.         Judgment: Judgment normal.      No results found for any visits on 09/23/24.  No results found for this or any previous visit (from the past 2160 hours).    Assessment & Plan:  Augmentin  Medrol  dose pack Mucinex Drink plenty of water  Problem List Items Addressed This Visit       Respiratory   Acute URI - Primary    Return in about 3 months (around 12/22/2024).   Total time spent: 25 minutes. This time includes review of previous notes and results and patient face to face interaction during today's visit.    Jeoffrey Pollen, NP  09/23/2024   This document may have been prepared by [facility] Voice Recognition software and as such may include unintentional dictation errors.     [1]  Allergies Allergen Reactions   Nickel Rash  [2]  Outpatient Medications Prior to Visit  Medication Sig   amphetamine-dextroamphetamine (ADDERALL) 20 MG tablet Take 20 mg by mouth 2 (two) times daily.   lamoTRIgine  (LAMICTAL ) 200 MG tablet Take 200 mg by mouth in the morning.   lamoTRIgine  (LAMICTAL ) 25 MG tablet Take 50 mg by mouth at bedtime.   norethindrone  (ORTHO MICRONOR ) 0.35 MG tablet Take 1 tablet (0.35 mg total) by mouth daily.   ZOLOFT 100 MG tablet    No facility-administered medications prior to visit.   "
